# Patient Record
Sex: Female | Born: 1955
Health system: Southern US, Community
[De-identification: ages and names within clinical notes are randomized; demographics above are authoritative.]

## PROBLEM LIST (undated history)

## (undated) DIAGNOSIS — T4145XA Adverse effect of unspecified anesthetic, initial encounter: Secondary | ICD-10-CM

## (undated) DIAGNOSIS — Z87442 Personal history of urinary calculi: Secondary | ICD-10-CM

## (undated) DIAGNOSIS — M8580 Other specified disorders of bone density and structure, unspecified site: Secondary | ICD-10-CM

## (undated) DIAGNOSIS — M1990 Unspecified osteoarthritis, unspecified site: Secondary | ICD-10-CM

## (undated) DIAGNOSIS — Z9889 Other specified postprocedural states: Secondary | ICD-10-CM

## (undated) DIAGNOSIS — F32A Depression, unspecified: Secondary | ICD-10-CM

## (undated) DIAGNOSIS — T8859XA Other complications of anesthesia, initial encounter: Secondary | ICD-10-CM

## (undated) DIAGNOSIS — E781 Pure hyperglyceridemia: Secondary | ICD-10-CM

## (undated) DIAGNOSIS — I10 Essential (primary) hypertension: Secondary | ICD-10-CM

## (undated) DIAGNOSIS — F419 Anxiety disorder, unspecified: Secondary | ICD-10-CM

## (undated) DIAGNOSIS — H269 Unspecified cataract: Secondary | ICD-10-CM

## (undated) DIAGNOSIS — K219 Gastro-esophageal reflux disease without esophagitis: Secondary | ICD-10-CM

## (undated) DIAGNOSIS — R112 Nausea with vomiting, unspecified: Secondary | ICD-10-CM

## (undated) HISTORY — PX: VEIN LIGATION AND STRIPPING: SHX2653

## (undated) HISTORY — PX: FRACTURE SURGERY: SHX138

## (undated) HISTORY — PX: LITHOTRIPSY: SUR834

## (undated) HISTORY — PX: FOOT SURGERY: SHX648

## (undated) HISTORY — DX: Unspecified cataract: H26.9

## (undated) HISTORY — DX: Other specified disorders of bone density and structure, unspecified site: M85.80

## (undated) HISTORY — DX: Essential (primary) hypertension: I10

## (undated) HISTORY — DX: Depression, unspecified: F32.A

## (undated) HISTORY — DX: Anxiety disorder, unspecified: F41.9

## (undated) HISTORY — PX: TUBAL LIGATION: SHX77

## (undated) HISTORY — DX: Pure hyperglyceridemia: E78.1

## (undated) HISTORY — PX: CHOLECYSTECTOMY: SHX55

---
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
## 2001-08-22 ENCOUNTER — Other Ambulatory Visit: Admission: RE | Admit: 2001-08-22 | Discharge: 2001-08-22 | Payer: Self-pay | Admitting: Family Medicine

## 2004-07-04 ENCOUNTER — Other Ambulatory Visit: Payer: Self-pay

## 2005-01-17 ENCOUNTER — Ambulatory Visit: Payer: Self-pay | Admitting: Family Medicine

## 2005-07-06 DIAGNOSIS — N63 Unspecified lump in unspecified breast: Secondary | ICD-10-CM | POA: Insufficient documentation

## 2006-03-13 ENCOUNTER — Ambulatory Visit: Payer: Self-pay | Admitting: Family Medicine

## 2006-06-06 ENCOUNTER — Ambulatory Visit: Payer: Self-pay

## 2007-03-28 DIAGNOSIS — F5104 Psychophysiologic insomnia: Secondary | ICD-10-CM | POA: Insufficient documentation

## 2007-03-28 DIAGNOSIS — G4700 Insomnia, unspecified: Secondary | ICD-10-CM | POA: Insufficient documentation

## 2007-06-11 ENCOUNTER — Ambulatory Visit: Payer: Self-pay

## 2008-05-19 ENCOUNTER — Ambulatory Visit: Payer: Self-pay

## 2008-06-15 ENCOUNTER — Ambulatory Visit: Payer: Self-pay | Admitting: Obstetrics and Gynecology

## 2008-12-01 ENCOUNTER — Ambulatory Visit: Payer: Self-pay | Admitting: Obstetrics and Gynecology

## 2009-03-13 ENCOUNTER — Emergency Department: Payer: Self-pay | Admitting: Emergency Medicine

## 2009-04-15 ENCOUNTER — Ambulatory Visit: Payer: Self-pay | Admitting: Family Medicine

## 2009-06-29 ENCOUNTER — Ambulatory Visit: Payer: Self-pay | Admitting: Obstetrics and Gynecology

## 2009-06-30 ENCOUNTER — Ambulatory Visit: Payer: Self-pay | Admitting: Obstetrics and Gynecology

## 2009-07-06 ENCOUNTER — Other Ambulatory Visit: Payer: Self-pay | Admitting: Obstetrics and Gynecology

## 2009-09-20 ENCOUNTER — Emergency Department: Payer: Self-pay | Admitting: Emergency Medicine

## 2009-10-19 ENCOUNTER — Other Ambulatory Visit: Payer: Self-pay | Admitting: Cardiology

## 2009-11-23 ENCOUNTER — Ambulatory Visit: Payer: Self-pay | Admitting: Unknown Physician Specialty

## 2009-11-23 LAB — HM COLONOSCOPY

## 2010-03-13 ENCOUNTER — Ambulatory Visit: Payer: Self-pay | Admitting: Vascular Surgery

## 2010-03-17 ENCOUNTER — Ambulatory Visit: Payer: Self-pay | Admitting: Podiatry

## 2010-08-16 ENCOUNTER — Ambulatory Visit: Payer: Self-pay | Admitting: Obstetrics and Gynecology

## 2010-12-20 ENCOUNTER — Other Ambulatory Visit: Payer: Self-pay | Admitting: Obstetrics and Gynecology

## 2010-12-24 HISTORY — PX: ANKLE FRACTURE SURGERY: SHX122

## 2010-12-26 ENCOUNTER — Ambulatory Visit: Payer: Self-pay

## 2010-12-31 ENCOUNTER — Ambulatory Visit: Payer: Self-pay | Admitting: Family Medicine

## 2011-09-26 ENCOUNTER — Ambulatory Visit: Payer: Self-pay | Admitting: Obstetrics and Gynecology

## 2011-11-27 ENCOUNTER — Encounter: Payer: Self-pay | Admitting: Orthopedic Surgery

## 2011-12-12 ENCOUNTER — Other Ambulatory Visit: Payer: Self-pay | Admitting: Internal Medicine

## 2011-12-25 ENCOUNTER — Encounter: Payer: Self-pay | Admitting: Orthopedic Surgery

## 2011-12-31 ENCOUNTER — Other Ambulatory Visit: Payer: Self-pay | Admitting: Internal Medicine

## 2012-01-25 ENCOUNTER — Encounter: Payer: Self-pay | Admitting: Orthopedic Surgery

## 2012-02-22 ENCOUNTER — Encounter: Payer: Self-pay | Admitting: Orthopedic Surgery

## 2012-04-08 ENCOUNTER — Other Ambulatory Visit: Payer: Self-pay | Admitting: Internal Medicine

## 2012-04-08 LAB — LIPID PANEL
Cholesterol: 168 mg/dL (ref 0–200)
HDL Cholesterol: 61 mg/dL — ABNORMAL HIGH (ref 40–60)
Ldl Cholesterol, Calc: 89 mg/dL (ref 0–100)
Triglycerides: 90 mg/dL (ref 0–200)
VLDL Cholesterol, Calc: 18 mg/dL (ref 5–40)

## 2012-04-08 LAB — TSH: Thyroid Stimulating Horm: 2.46 u[IU]/mL

## 2012-04-08 LAB — HEMOGLOBIN A1C: Hemoglobin A1C: 5.6 % (ref 4.2–6.3)

## 2012-04-22 DIAGNOSIS — M19079 Primary osteoarthritis, unspecified ankle and foot: Secondary | ICD-10-CM | POA: Insufficient documentation

## 2012-10-04 ENCOUNTER — Emergency Department: Payer: Self-pay | Admitting: Emergency Medicine

## 2012-10-22 ENCOUNTER — Ambulatory Visit: Payer: Self-pay | Admitting: Obstetrics and Gynecology

## 2013-01-27 ENCOUNTER — Encounter: Payer: Self-pay | Admitting: Orthopedic Surgery

## 2013-02-17 DIAGNOSIS — M20019 Mallet finger of unspecified finger(s): Secondary | ICD-10-CM | POA: Insufficient documentation

## 2013-02-21 ENCOUNTER — Encounter: Payer: Self-pay | Admitting: Orthopedic Surgery

## 2013-03-24 ENCOUNTER — Encounter: Payer: Self-pay | Admitting: Orthopedic Surgery

## 2013-08-13 ENCOUNTER — Emergency Department: Payer: Self-pay | Admitting: Emergency Medicine

## 2013-08-13 LAB — URINALYSIS, COMPLETE
Bacteria: NONE SEEN
Bilirubin,UR: NEGATIVE
Glucose,UR: NEGATIVE mg/dL (ref 0–75)
Leukocyte Esterase: NEGATIVE
Nitrite: NEGATIVE
Ph: 6 (ref 4.5–8.0)
Protein: NEGATIVE
RBC,UR: NONE SEEN /HPF (ref 0–5)
Specific Gravity: 1.003 (ref 1.003–1.030)
Squamous Epithelial: 1
WBC UR: <1 /HPF (ref 0–5)

## 2013-08-13 LAB — COMPREHENSIVE METABOLIC PANEL
Albumin: 3.8 g/dL (ref 3.4–5.0)
Alkaline Phosphatase: 94 U/L (ref 50–136)
Anion Gap: 7 (ref 7–16)
BUN: 10 mg/dL (ref 7–18)
Bilirubin,Total: 0.3 mg/dL (ref 0.2–1.0)
Calcium, Total: 9.2 mg/dL (ref 8.5–10.1)
Chloride: 106 mmol/L (ref 98–107)
Co2: 26 mmol/L (ref 21–32)
Creatinine: 0.92 mg/dL (ref 0.60–1.30)
EGFR (African American): >60
EGFR (Non-African Amer.): >60
Glucose: 79 mg/dL (ref 65–99)
Osmolality: 276 (ref 275–301)
Potassium: 3.7 mmol/L (ref 3.5–5.1)
SGOT(AST): 18 U/L (ref 15–37)
SGPT (ALT): 18 U/L (ref 12–78)
Sodium: 139 mmol/L (ref 136–145)
Total Protein: 7.9 g/dL (ref 6.4–8.2)

## 2013-08-13 LAB — CBC
HCT: 39.4 % (ref 35.0–47.0)
HGB: 13.6 g/dL (ref 12.0–16.0)
MCH: 30.9 pg (ref 26.0–34.0)
MCHC: 34.5 g/dL (ref 32.0–36.0)
MCV: 90 fL (ref 80–100)
Platelet: 313 10*3/uL (ref 150–440)
RBC: 4.4 10*6/uL (ref 3.80–5.20)
RDW: 13.5 % (ref 11.5–14.5)
WBC: 8.8 10*3/uL (ref 3.6–11.0)

## 2013-10-27 ENCOUNTER — Ambulatory Visit: Payer: Self-pay | Admitting: Obstetrics and Gynecology

## 2013-11-10 ENCOUNTER — Other Ambulatory Visit: Payer: Self-pay | Admitting: Obstetrics and Gynecology

## 2013-11-10 LAB — LIPID PANEL
Cholesterol: 185 mg/dL (ref 0–200)
HDL Cholesterol: 58 mg/dL (ref 40–60)
Ldl Cholesterol, Calc: 103 mg/dL — ABNORMAL HIGH (ref 0–100)
Triglycerides: 121 mg/dL (ref 0–200)
VLDL Cholesterol, Calc: 24 mg/dL (ref 5–40)

## 2014-11-23 ENCOUNTER — Ambulatory Visit: Payer: Self-pay | Admitting: Obstetrics and Gynecology

## 2015-01-11 ENCOUNTER — Emergency Department: Payer: Self-pay | Admitting: Emergency Medicine

## 2015-01-11 LAB — CBC
HCT: 43.1 % (ref 35.0–47.0)
HGB: 13.8 g/dL (ref 12.0–16.0)
MCH: 29.3 pg (ref 26.0–34.0)
MCHC: 32 g/dL (ref 32.0–36.0)
MCV: 92 fL (ref 80–100)
Platelet: 281 10*3/uL (ref 150–440)
RBC: 4.71 10*6/uL (ref 3.80–5.20)
RDW: 13.5 % (ref 11.5–14.5)
WBC: 8.9 10*3/uL (ref 3.6–11.0)

## 2015-01-11 LAB — COMPREHENSIVE METABOLIC PANEL
Albumin: 3.5 g/dL (ref 3.4–5.0)
Alkaline Phosphatase: 91 U/L
Anion Gap: 5 — ABNORMAL LOW (ref 7–16)
BUN: 13 mg/dL (ref 7–18)
Bilirubin,Total: 0.2 mg/dL (ref 0.2–1.0)
Calcium, Total: 8.7 mg/dL (ref 8.5–10.1)
Chloride: 106 mmol/L (ref 98–107)
Co2: 28 mmol/L (ref 21–32)
Creatinine: 0.95 mg/dL (ref 0.60–1.30)
EGFR (African American): >60
EGFR (Non-African Amer.): >60
Glucose: 99 mg/dL (ref 65–99)
Osmolality: 278 (ref 275–301)
Potassium: 3.8 mmol/L (ref 3.5–5.1)
SGOT(AST): 20 U/L (ref 15–37)
SGPT (ALT): 25 U/L
Sodium: 139 mmol/L (ref 136–145)
Total Protein: 7.4 g/dL (ref 6.4–8.2)

## 2015-08-31 ENCOUNTER — Other Ambulatory Visit: Payer: Self-pay | Admitting: Obstetrics and Gynecology

## 2015-08-31 DIAGNOSIS — Z1231 Encounter for screening mammogram for malignant neoplasm of breast: Secondary | ICD-10-CM

## 2015-08-31 DIAGNOSIS — Z13820 Encounter for screening for osteoporosis: Secondary | ICD-10-CM

## 2015-11-28 ENCOUNTER — Ambulatory Visit: Payer: Self-pay

## 2015-11-28 ENCOUNTER — Other Ambulatory Visit: Payer: Self-pay

## 2015-12-05 ENCOUNTER — Ambulatory Visit
Admission: RE | Admit: 2015-12-05 | Discharge: 2015-12-05 | Disposition: A | Discharge status: Home / Self Care | Payer: Managed Care, Other (non HMO) | Source: Ambulatory Visit | Attending: Obstetrics and Gynecology | Admitting: Obstetrics and Gynecology

## 2015-12-05 DIAGNOSIS — Z13820 Encounter for screening for osteoporosis: Secondary | ICD-10-CM | POA: Diagnosis present

## 2015-12-05 DIAGNOSIS — Z1231 Encounter for screening mammogram for malignant neoplasm of breast: Secondary | ICD-10-CM | POA: Insufficient documentation

## 2015-12-05 DIAGNOSIS — M8580 Other specified disorders of bone density and structure, unspecified site: Secondary | ICD-10-CM | POA: Insufficient documentation

## 2016-06-25 DIAGNOSIS — M8580 Other specified disorders of bone density and structure, unspecified site: Secondary | ICD-10-CM | POA: Insufficient documentation

## 2016-06-25 DIAGNOSIS — H9319 Tinnitus, unspecified ear: Secondary | ICD-10-CM | POA: Insufficient documentation

## 2016-06-25 DIAGNOSIS — E781 Pure hyperglyceridemia: Secondary | ICD-10-CM

## 2016-06-25 DIAGNOSIS — F4322 Adjustment disorder with anxiety: Secondary | ICD-10-CM | POA: Insufficient documentation

## 2016-06-25 DIAGNOSIS — M25579 Pain in unspecified ankle and joints of unspecified foot: Secondary | ICD-10-CM | POA: Insufficient documentation

## 2016-06-25 DIAGNOSIS — F410 Panic disorder [episodic paroxysmal anxiety] without agoraphobia: Secondary | ICD-10-CM | POA: Insufficient documentation

## 2016-06-25 DIAGNOSIS — E559 Vitamin D deficiency, unspecified: Secondary | ICD-10-CM | POA: Insufficient documentation

## 2016-06-25 HISTORY — DX: Pure hyperglyceridemia: E78.1

## 2016-06-25 HISTORY — DX: Other specified disorders of bone density and structure, unspecified site: M85.80

## 2016-07-02 ENCOUNTER — Encounter: Payer: Self-pay | Admitting: Family Medicine

## 2016-07-02 ENCOUNTER — Ambulatory Visit (INDEPENDENT_AMBULATORY_CARE_PROVIDER_SITE_OTHER): Payer: Managed Care, Other (non HMO) | Admitting: Family Medicine

## 2016-07-02 VITALS — BP 116/72 | HR 76 | Temp 98.1°F | Resp 16 | Ht 64.5 in | Wt 198.0 lb

## 2016-07-02 DIAGNOSIS — Z131 Encounter for screening for diabetes mellitus: Secondary | ICD-10-CM

## 2016-07-02 DIAGNOSIS — E781 Pure hyperglyceridemia: Secondary | ICD-10-CM | POA: Diagnosis not present

## 2016-07-02 DIAGNOSIS — F4322 Adjustment disorder with anxiety: Secondary | ICD-10-CM | POA: Diagnosis not present

## 2016-07-02 MED ORDER — ESCITALOPRAM OXALATE 10 MG PO TABS: 10.0000 mg | ORAL_TABLET | Freq: Every day | ORAL | Status: DC

## 2016-07-02 NOTE — Patient Instructions (Signed)
We will call you with the lab results. 

## 2016-07-02 NOTE — Progress Notes (Signed)
Subjective:     Patient ID: KITRINA SCHUBEL, female   DOB: 1956/07/05, 60 y.o.   MRN: [id number]  HPI  Chief Complaint  Patient presents with  . Anxiety    Last seen for these problems on 12/03/2014 by Dr. Venia Minks. Was stable at that time and was told to continue Lexapro.  States she wishes to stay on Lexapro. Reports that she is still dealing with the loss of her sister, Mother, and a stillborn infant of a family member (May) over the last few years. States she wishes screening labs today. Gets the bulk of her health maintenance through her ob-gyn, Dr. Suszanne Finch.   Review of Systems     Objective:   Physical Exam  Constitutional: She appears well-developed and well-nourished. No distress.  Psychiatric: She has a normal mood and affect. Her behavior is normal.       Assessment:    1. Hypertriglyceridemia - Lipid panel  2. Adjustment disorder with anxiety - escitalopram (LEXAPRO) 10 MG tablet; Take 1 tablet (10 mg total) by mouth daily.  Dispense: 90 tablet; Refill: 3  3. Screening for diabetes mellitus - Comprehensive metabolic panel    Plan:    Further f/u pending lab results.

## 2016-07-03 ENCOUNTER — Telehealth: Payer: Self-pay

## 2016-07-03 LAB — COMPREHENSIVE METABOLIC PANEL
ALT: 20 IU/L (ref 0–32)
AST: 23 IU/L (ref 0–40)
Albumin/Globulin Ratio: 1.6 (ref 1.2–2.2)
Albumin: 4.1 g/dL (ref 3.6–4.8)
Alkaline Phosphatase: 78 IU/L (ref 39–117)
BUN/Creatinine Ratio: 17 (ref 12–28)
BUN: 14 mg/dL (ref 8–27)
Bilirubin Total: 0.3 mg/dL (ref 0.0–1.2)
CO2: 24 mmol/L (ref 18–29)
Calcium: 9.3 mg/dL (ref 8.7–10.3)
Chloride: 104 mmol/L (ref 96–106)
Creatinine, Ser: 0.83 mg/dL (ref 0.57–1.00)
GFR calc Af Amer: 89 mL/min/{1.73_m2} (ref >59)
GFR calc non Af Amer: 77 mL/min/{1.73_m2} (ref >59)
Globulin, Total: 2.6 g/dL (ref 1.5–4.5)
Glucose: 112 mg/dL — ABNORMAL HIGH (ref 65–99)
Potassium: 4.3 mmol/L (ref 3.5–5.2)
Sodium: 142 mmol/L (ref 134–144)
Total Protein: 6.7 g/dL (ref 6.0–8.5)

## 2016-07-03 LAB — LIPID PANEL
Chol/HDL Ratio: 2.6 ratio units (ref 0.0–4.4)
Cholesterol, Total: 154 mg/dL (ref 100–199)
HDL: 59 mg/dL (ref >39)
LDL Calculated: 82 mg/dL (ref 0–99)
Triglycerides: 67 mg/dL (ref 0–149)
VLDL Cholesterol Cal: 13 mg/dL (ref 5–40)

## 2016-07-03 NOTE — Telephone Encounter (Signed)
Patient advised as below. Patient reports that she will work on diet and exercise for now. sd

## 2016-07-03 NOTE — Telephone Encounter (Signed)
-----   Message from Carmon Ginsberg, [place] sent at 07/03/2016  7:36 AM EDT ----- Labs good except glucose is at "pre-diabetic" levels. Encourage regular exercise 30 minutes daily. Offer pre-diabetic class at [facility] [phone].

## 2016-09-13 LAB — HM PAP SMEAR

## 2016-09-17 ENCOUNTER — Other Ambulatory Visit: Payer: Self-pay | Admitting: Obstetrics and Gynecology

## 2016-09-17 DIAGNOSIS — Z1231 Encounter for screening mammogram for malignant neoplasm of breast: Secondary | ICD-10-CM

## 2016-09-17 DIAGNOSIS — Z13820 Encounter for screening for osteoporosis: Secondary | ICD-10-CM

## 2016-12-06 ENCOUNTER — Ambulatory Visit: Payer: Managed Care, Other (non HMO)

## 2016-12-06 ENCOUNTER — Other Ambulatory Visit: Payer: Managed Care, Other (non HMO)

## 2016-12-18 ENCOUNTER — Ambulatory Visit
Admission: RE | Admit: 2016-12-18 | Discharge: 2016-12-18 | Disposition: A | Discharge status: Home / Self Care | Payer: Managed Care, Other (non HMO) | Source: Ambulatory Visit | Attending: Obstetrics and Gynecology | Admitting: Obstetrics and Gynecology

## 2016-12-18 DIAGNOSIS — Z13820 Encounter for screening for osteoporosis: Secondary | ICD-10-CM

## 2016-12-18 DIAGNOSIS — Z1231 Encounter for screening mammogram for malignant neoplasm of breast: Secondary | ICD-10-CM

## 2017-01-29 ENCOUNTER — Ambulatory Visit (INDEPENDENT_AMBULATORY_CARE_PROVIDER_SITE_OTHER): Payer: Managed Care, Other (non HMO) | Admitting: Physician Assistant

## 2017-01-29 ENCOUNTER — Encounter: Payer: Self-pay | Admitting: Physician Assistant

## 2017-01-29 VITALS — BP 158/90 | HR 72 | Temp 98.8°F | Resp 16 | Wt 199.0 lb

## 2017-01-29 DIAGNOSIS — R7303 Prediabetes: Secondary | ICD-10-CM

## 2017-01-29 DIAGNOSIS — R030 Elevated blood-pressure reading, without diagnosis of hypertension: Secondary | ICD-10-CM

## 2017-01-29 DIAGNOSIS — R739 Hyperglycemia, unspecified: Secondary | ICD-10-CM

## 2017-01-29 DIAGNOSIS — R51 Headache: Secondary | ICD-10-CM

## 2017-01-29 DIAGNOSIS — R519 Headache, unspecified: Secondary | ICD-10-CM

## 2017-01-29 LAB — POCT GLYCOSYLATED HEMOGLOBIN (HGB A1C): Hemoglobin A1C: 5.9

## 2017-01-29 MED ORDER — KETOROLAC TROMETHAMINE 60 MG/2ML IM SOLN
60.0000 mg | Freq: Once | INTRAMUSCULAR | Status: AC
  Administered 2017-01-29: 60 mg via INTRAMUSCULAR

## 2017-01-29 NOTE — Patient Instructions (Signed)
Hypertension Hypertension, commonly called high blood pressure, is when the force of blood pumping through your arteries is too strong. Your arteries are the blood vessels that carry blood from your heart throughout your body. A blood pressure reading consists of a higher number over a lower number, such as 110/72. The higher number (systolic) is the pressure inside your arteries when your heart pumps. The lower number (diastolic) is the pressure inside your arteries when your heart relaxes. Ideally you want your blood pressure below 120/80. Hypertension forces your heart to work harder to pump blood. Your arteries may become narrow or stiff. Having untreated or uncontrolled hypertension can cause heart attack, stroke, kidney disease, and other problems. What increases the risk? Some risk factors for high blood pressure are controllable. Others are not. Risk factors you cannot control include:  Race. You may be at higher risk if you are African American.  Age. Risk increases with age.  Gender. Men are at higher risk than women before age 45 years. After age 65, women are at higher risk than men. Risk factors you can control include:  Not getting enough exercise or physical activity.  Being overweight.  Getting too much fat, sugar, calories, or salt in your diet.  Drinking too much alcohol. What are the signs or symptoms? Hypertension does not usually cause signs or symptoms. Extremely high blood pressure (hypertensive crisis) may cause headache, anxiety, shortness of breath, and nosebleed. How is this diagnosed? To check if you have hypertension, your health care provider will measure your blood pressure while you are seated, with your arm held at the level of your heart. It should be measured at least twice using the same arm. Certain conditions can cause a difference in blood pressure between your right and left arms. A blood pressure reading that is higher than normal on one occasion does  not mean that you need treatment. If it is not clear whether you have high blood pressure, you may be asked to return on a different day to have your blood pressure checked again. Or, you may be asked to monitor your blood pressure at home for 1 or more weeks. How is this treated? Treating high blood pressure includes making lifestyle changes and possibly taking medicine. Living a healthy lifestyle can help lower high blood pressure. You may need to change some of your habits. Lifestyle changes may include:  Following the DASH diet. This diet is high in fruits, vegetables, and whole grains. It is low in salt, red meat, and added sugars.  Keep your sodium intake below 2,300 mg per day.  Getting at least 30-45 minutes of aerobic exercise at least 4 times per week.  Losing weight if necessary.  Not smoking.  Limiting alcoholic beverages.  Learning ways to reduce stress. Your health care provider may prescribe medicine if lifestyle changes are not enough to get your blood pressure under control, and if one of the following is true:  You are 18-59 years of age and your systolic blood pressure is above 140.  You are 60 years of age or older, and your systolic blood pressure is above 150.  Your diastolic blood pressure is above 90.  You have diabetes, and your systolic blood pressure is over 140 or your diastolic blood pressure is over 90.  You have kidney disease and your blood pressure is above 140/90.  You have heart disease and your blood pressure is above 140/90. Your personal target blood pressure may vary depending on your medical   conditions, your age, and other factors. Follow these instructions at home:  Have your blood pressure rechecked as directed by your health care provider.  Take medicines only as directed by your health care provider. Follow the directions carefully. Blood pressure medicines must be taken as prescribed. The medicine does not work as well when you skip  doses. Skipping doses also puts you at risk for problems.  Do not smoke.  Monitor your blood pressure at home as directed by your health care provider. Contact a health care provider if:  You think you are having a reaction to medicines taken.  You have recurrent headaches or feel dizzy.  You have swelling in your ankles.  You have trouble with your vision. Get help right away if:  You develop a severe headache or confusion.  You have unusual weakness, numbness, or feel faint.  You have severe chest or abdominal pain.  You vomit repeatedly.  You have trouble breathing. This information is not intended to replace advice given to you by your health care provider. Make sure you discuss any questions you have with your health care provider. Document Released: 12/10/2005 Document Revised: 05/17/2016 Document Reviewed: 10/02/2013 Elsevier Interactive Patient Education  2017 [employer]vier Inc[employer]  

## 2017-01-29 NOTE — Progress Notes (Signed)
Patient: [name] [name] Female    DOB: [birth year]   61 y.o.   MRN: [id number] Visit Date: 01/29/2017  Today's Provider: Trinna Post, PA-C   Chief Complaint  Patient presents with  . Hypertension  . Hyperglycemia   Subjective:    Hypertension  This is a new problem. The problem is uncontrolled. Associated symptoms include anxiety, blurred vision, headaches and malaise/fatigue. Pertinent negatives include no chest pain, neck pain, palpitations, peripheral edema or shortness of breath.  Hyperglycemia  This is a new problem. Associated symptoms include fatigue and headaches. Pertinent negatives include no abdominal pain, change in bowel habit, chest pain, chills, congestion, diaphoresis, fever, neck pain, numbness, rash, sore throat or urinary symptoms.   Patient is a 61 y/o woman with a hx of hypertriglyceridemia here for assessment of HTN, headache, and prediabetes.  She was recently at an opthalmology appointment where they measured her eye pressures, and her right eye pressure was high, no cranial nerve II swelling. The optometrist told her to check whether she has high blood pressure, prediabetes, sleep apnea.  She has had a headache since Friday. She says she normally gets headaches every once in a while. It was not sudden. It is bitemporal. She is not nauseated or vomiting. She has sensitivity to light but not sound. No weakness or balance loss. She has been checking her BP when she has headaches and it is high. Her last documented BP here was normotensive, but at home it has been as high as 175/100. She has been taking ibuprofen for headaches without much relief. No Cp, Vision changes, SOB, problems urinating.  She also recently had a high fasting glucose and is here to have her A1C checked. She used to do water aerobics at the gym, but her husband has recently had surgeries and so she has gotten out of this habit. She is working on her diet again.  Allergies  Allergen  Reactions  . Demeclocycline   . Oxycodone-Acetaminophen Other (See Comments)     Current Outpatient Prescriptions:  .  calcium-vitamin D (OSCAL WITH D) 500-200 MG-UNIT tablet, Take 1 tablet by mouth., Disp: , Rfl:  .  Multiple Vitamin (MULTIVITAMIN) capsule, Take 1 capsule by mouth daily., Disp: , Rfl:  .  niacin 500 MG tablet, Take 500 mg by mouth 2 (two) times daily with a meal. , Disp: , Rfl:   Review of Systems  Constitutional: Positive for fatigue and malaise/fatigue. Negative for activity change, appetite change, chills, diaphoresis, fever and unexpected weight change.  HENT: Negative for congestion, sinus pain, sinus pressure and sore throat.   Eyes: Positive for blurred vision. Negative for photophobia, pain, discharge, redness, itching and visual disturbance.  Respiratory: Negative.  Negative for shortness of breath.   Cardiovascular: Negative.  Negative for chest pain and palpitations.  Gastrointestinal: Negative.  Negative for abdominal pain and change in bowel habit.  Endocrine: Negative for cold intolerance, heat intolerance, polydipsia, polyphagia and polyuria.  Musculoskeletal: Negative for neck pain.  Skin: Negative for rash.  Neurological: Positive for light-headedness and headaches. Negative for dizziness and numbness.    Social History  Substance Use Topics  . Smoking status: Never Smoker  . Smokeless tobacco: Never Used  . Alcohol use No     Comment: rare   Objective:   BP (!) 158/90 (BP Location: Right Arm, Patient Position: Sitting, Cuff Size: Large)   Pulse 72   Temp 98.8 F (37.1 C) (Oral)   Resp 16  Wt 199 lb (90.3 kg)   BMI 33.63 kg/m   Physical Exam  Constitutional: She is oriented to person, place, and time. She appears well-developed and well-nourished. No distress.  Appears to be in minor pain, sensitive to room lights.  HENT:  Head: Normocephalic.  Right Ear: External ear normal.  Left Ear: External ear normal.  Mouth/Throat: No  oropharyngeal exudate.  Eyes: Conjunctivae and EOM are normal. Pupils are equal, round, and reactive to light.  Neck: Neck supple.  Cardiovascular: Normal rate, regular rhythm, normal heart sounds and intact distal pulses.   Pulmonary/Chest: Effort normal and breath sounds normal.  Lymphadenopathy:    She has no cervical adenopathy.  Neurological: She is alert and oriented to person, place, and time. No cranial nerve deficit. Coordination normal.  Skin: Skin is warm and dry.  Psychiatric: She has a normal mood and affect. Her behavior is normal.        Assessment & Plan:     1. Elevated blood sugar  A1c today was 5.9. Counseled patient this is prediabetic. She should work on diet and exercise and we will recheck in three months.   - POCT glycosylated hemoglobin (Hb A1C)  2. Acute nonintractable headache, unspecified headache type  Administered in office, no hx GI bleeding, kidney function normal. Have counseled on return precautions.  - ketorolac (TORADOL) injection 60 mg; Inject 2 mLs (60 mg total) into the muscle once.  3. Prediabetes  See above.  4. Elevated BP without diagnosis of hypertension  Had patient fill out Epworth, but score was very low, not enough to indicate sleep study. This is patient's first documented elevated BP reading. Will have her followup in 2 weeks for recheck.  Return in about 2 weeks (around 02/12/2017) for BP recheck, then 3 mo for A1C.   Patient Instructions  Hypertension Hypertension, commonly called high blood pressure, is when the force of blood pumping through your arteries is too strong. Your arteries are the blood vessels that carry blood from your heart throughout your body. A blood pressure reading consists of a higher number over a lower number, such as 110/72. The higher number (systolic) is the pressure inside your arteries when your heart pumps. The lower number (diastolic) is the pressure inside your arteries when your heart relaxes.  Ideally you want your blood pressure below 120/80. Hypertension forces your heart to work harder to pump blood. Your arteries may become narrow or stiff. Having untreated or uncontrolled hypertension can cause heart attack, stroke, kidney disease, and other problems. What increases the risk? Some risk factors for high blood pressure are controllable. Others are not. Risk factors you cannot control include:  Race. You may be at higher risk if you are African American.  Age. Risk increases with age.  Gender. Men are at higher risk than women before age 23 years. After age 11, women are at higher risk than men. Risk factors you can control include:  Not getting enough exercise or physical activity.  Being overweight.  Getting too much fat, sugar, calories, or salt in your diet.  Drinking too much alcohol. What are the signs or symptoms? Hypertension does not usually cause signs or symptoms. Extremely high blood pressure (hypertensive crisis) may cause headache, anxiety, shortness of breath, and nosebleed. How is this diagnosed? To check if you have hypertension, your health care provider will measure your blood pressure while you are seated, with your arm held at the level of your heart. It should be measured at least  twice using the same arm. Certain conditions can cause a difference in blood pressure between your right and left arms. A blood pressure reading that is higher than normal on one occasion does not mean that you need treatment. If it is not clear whether you have high blood pressure, you may be asked to return on a different day to have your blood pressure checked again. Or, you may be asked to monitor your blood pressure at home for 1 or more weeks. How is this treated? Treating high blood pressure includes making lifestyle changes and possibly taking medicine. Living a healthy lifestyle can help lower high blood pressure. You may need to change some of your habits. Lifestyle  changes may include:  Following the DASH diet. This diet is high in fruits, vegetables, and whole grains. It is low in salt, red meat, and added sugars.  Keep your sodium intake below 2,300 mg per day.  Getting at least 30-45 minutes of aerobic exercise at least 4 times per week.  Losing weight if necessary.  Not smoking.  Limiting alcoholic beverages.  Learning ways to reduce stress. Your health care provider may prescribe medicine if lifestyle changes are not enough to get your blood pressure under control, and if one of the following is true:  You are 4-42 years of age and your systolic blood pressure is above 140.  You are 12 years of age or older, and your systolic blood pressure is above 150.  Your diastolic blood pressure is above 90.  You have diabetes, and your systolic blood pressure is over [id number] or your diastolic blood pressure is over 90.  You have kidney disease and your blood pressure is above 140/90.  You have heart disease and your blood pressure is above 140/90. Your personal target blood pressure may vary depending on your medical conditions, your age, and other factors. Follow these instructions at home:  Have your blood pressure rechecked as directed by your health care provider.  Take medicines only as directed by your health care provider. Follow the directions carefully. Blood pressure medicines must be taken as prescribed. The medicine does not work as well when you skip doses. Skipping doses also puts you at risk for problems.  Do not smoke.  Monitor your blood pressure at home as directed by your health care provider. Contact a health care provider if:  You think you are having a reaction to medicines taken.  You have recurrent headaches or feel dizzy.  You have swelling in your ankles.  You have trouble with your vision. Get help right away if:  You develop a severe headache or confusion.  You have unusual weakness, numbness, or feel  faint.  You have severe chest or abdominal pain.  You vomit repeatedly.  You have trouble breathing. This information is not intended to replace advice given to you by your health care provider. Make sure you discuss any questions you have with your health care provider. Document Released: 12/10/2005 Document Revised: 05/17/2016 Document Reviewed: 10/02/2013 Elsevier Interactive Patient Education  2017 [employer].   The entirety of the information documented in the History of Present Illness, Review of Systems and Physical Exam were personally obtained by me. Portions of this information were initially documented by Ashley Royalty, CMA and reviewed by me for thoroughness and accuracy.        Trinna Post, PA-C  [facility]

## 2017-01-30 ENCOUNTER — Emergency Department: Payer: Managed Care, Other (non HMO)

## 2017-01-30 ENCOUNTER — Encounter: Payer: Self-pay | Admitting: Emergency Medicine

## 2017-01-30 ENCOUNTER — Emergency Department
Admission: EM | Admit: 2017-01-30 | Discharge: 2017-01-30 | Disposition: A | Discharge status: Home / Self Care | Payer: Managed Care, Other (non HMO) | Attending: Emergency Medicine | Admitting: Emergency Medicine

## 2017-01-30 ENCOUNTER — Ambulatory Visit: Payer: Managed Care, Other (non HMO) | Admitting: Physician Assistant

## 2017-01-30 DIAGNOSIS — R51 Headache: Secondary | ICD-10-CM | POA: Diagnosis not present

## 2017-01-30 DIAGNOSIS — R519 Headache, unspecified: Secondary | ICD-10-CM

## 2017-01-30 DIAGNOSIS — I10 Essential (primary) hypertension: Secondary | ICD-10-CM | POA: Diagnosis not present

## 2017-01-30 DIAGNOSIS — R002 Palpitations: Secondary | ICD-10-CM | POA: Diagnosis present

## 2017-01-30 DIAGNOSIS — Z79899 Other long term (current) drug therapy: Secondary | ICD-10-CM | POA: Insufficient documentation

## 2017-01-30 LAB — BASIC METABOLIC PANEL
Anion gap: 7 (ref 5–15)
BUN: 17 mg/dL (ref 6–20)
CO2: 22 mmol/L (ref 22–32)
Calcium: 9.8 mg/dL (ref 8.9–10.3)
Chloride: 110 mmol/L (ref 101–111)
Creatinine, Ser: 0.87 mg/dL (ref 0.44–1.00)
GFR calc Af Amer: >60 mL/min (ref >60)
GFR calc non Af Amer: >60 mL/min (ref >60)
Glucose, Bld: 101 mg/dL — ABNORMAL HIGH (ref 65–99)
Potassium: 3.6 mmol/L (ref 3.5–5.1)
Sodium: 139 mmol/L (ref 135–145)

## 2017-01-30 LAB — CBC
HCT: 41.5 % (ref 35.0–47.0)
Hemoglobin: 14.2 g/dL (ref 12.0–16.0)
MCH: 30.4 pg (ref 26.0–34.0)
MCHC: 34.3 g/dL (ref 32.0–36.0)
MCV: 88.8 fL (ref 80.0–100.0)
Platelets: 304 10*3/uL (ref 150–440)
RBC: 4.68 MIL/uL (ref 3.80–5.20)
RDW: 13.4 % (ref 11.5–14.5)
WBC: 6 10*3/uL (ref 3.6–11.0)

## 2017-01-30 LAB — TROPONIN I
Troponin I: <0.03 ng/mL (ref <0.03)
Troponin I: <0.03 ng/mL (ref <0.03)

## 2017-01-30 MED ORDER — BUTALBITAL-APAP-CAFFEINE 50-325-40 MG PO TABS: 1.0000 | ORAL_TABLET | Freq: Four times a day (QID) | ORAL | 0 refills | Status: DC | PRN

## 2017-01-30 NOTE — ED Triage Notes (Signed)
Pt presents to ED with elevated blood pressure since thursday. 170/100 at home this morning and pt states she felt like her heart was beating too hard with a rate at home of 120. Pt reports that she noticed that she develops a headache when her blood pressure is elevated. Seen by pcp yesterday and was told that they felt her blood pressure being elevated was related to the severity of her headache. Pt  Alert and calm with no distress noted.

## 2017-01-30 NOTE — ED Provider Notes (Signed)
[facility] Emergency Department Provider Note   ____________________________________________   First MD Initiated Contact with Patient 01/30/17 [phone]     (approximate)  I have reviewed the triage vital signs and the nursing notes.   HISTORY  Chief Complaint Hypertension and Palpitations    HPI [name] [name] is a 61 y.o. female who comes into the hospital today with headache and elevated blood pressure. The patient reports that she went to the eye doctor yesterday and they did pressures and found that her intraocular pressures in her right eye were elevated. The eye doctor told her that given the fact that she had been having a headache this could be medical and not related to her eye. She reports that she also went to her family doctor's office and saw the PA. They were unsure if her headache was due to the blood pressure the blood pressure due to her headache. She  Received a shot of Toradol and was discharged to home. She reports that her blood pressures have continued to be up and down. She reports over the last few days it has been in the 140s over 90s with it being highest at 170/100. The patient reports that before going to bed last night she didn't Norco which helped with the headache but when she would get up her  Headache would be back. She reports that laying in the bed her blood pressure seems to go up but when she sits up her blood pressure seems to normalize. The patient did not take any prescription medications for blood pressure. She rates her headache 4 out of 10 in intensity. She reports it is nothing like it's been in the past. The patient states that she woke her husband up and told them what was going on and he was concerning which is why she came into the hospital.   Past Medical History:  Diagnosis Date  . Anxiety     Patient Active Problem List   Diagnosis Date Noted  . Adjustment disorder with anxiety 06/25/2016  .  Hypertriglyceridemia 06/25/2016  . Osteopenia 06/25/2016  . Buzzing in ear 06/25/2016  . Avitaminosis D 06/25/2016  . Cannot sleep 03/28/2007    Past Surgical History:  Procedure Laterality Date  . ANKLE FRACTURE SURGERY    . CHOLECYSTECTOMY      Prior to Admission medications   Medication Sig Start Date End Date Taking? Authorizing Provider  calcium-vitamin D (OSCAL WITH D) 500-200 MG-UNIT tablet Take 1 tablet by mouth.   Yes Historical Provider, MD  Multiple Vitamin (MULTIVITAMIN) capsule Take 1 capsule by mouth daily.   Yes Historical Provider, MD  niacin 500 MG tablet Take 500 mg by mouth 2 (two) times daily with a meal.    Yes Historical Provider, MD  butalbital-acetaminophen-caffeine (FIORICET, ESGIC) 50-325-40 MG tablet Take 1-2 tablets by mouth every 6 (six) hours as needed for headache. 01/30/17 01/30/18  Loney Hering, MD    Allergies Oxycodone-acetaminophen  Family History  Problem Relation Age of Onset  . Breast cancer Maternal Aunt 60    Social History Social History  Substance Use Topics  . Smoking status: Never Smoker  . Smokeless tobacco: Never Used  . Alcohol use No     Comment: rare    Review of Systems Constitutional: No fever/chills Eyes: No visual changes. ENT: No sore throat. Cardiovascular:  chest pain. Respiratory: Denies shortness of breath. Gastrointestinal: No abdominal pain.  No nausea, no vomiting.  No diarrhea.  No constipation.  Genitourinary: Negative for dysuria. Musculoskeletal: Negative for back pain. Skin: Negative for rash. Neurological: headache  10-point ROS otherwise negative.  ____________________________________________   PHYSICAL EXAM:  VITAL SIGNS: ED Triage Vitals  Enc Vitals Group     BP 01/30/17 0329 (!) 155/90     Pulse Rate 01/30/17 0329 81     Resp 01/30/17 0329 18     Temp 01/30/17 0329 98.2 F (36.8 C)     Temp Source 01/30/17 0329 Oral     SpO2 01/30/17 0329 100 %     Weight 01/30/17 0330 199 lb  (90.3 kg)     Height 01/30/17 0330 5\' 5"  (1.651 m)     Head Circumference --      Peak Flow --      Pain Score 01/30/17 0330 5     Pain Loc --      Pain Edu? --      Excl. in [place]? --     Constitutional: Alert and oriented. Well appearing and in no acute distress. Eyes: Conjunctivae are normal. PERRL. EOMI. Head: Atraumatic. Nose: No congestion/rhinnorhea. Mouth/Throat: Mucous membranes are moist.  Oropharynx non-erythematous. Cardiovascular: Normal rate, regular rhythm. Grossly normal heart sounds.  Good peripheral circulation. Respiratory: Normal respiratory effort.  No retractions. Lungs CTAB. Gastrointestinal: Soft and nontender. No distention. No abdominal bruits. No CVA tenderness. Musculoskeletal: No lower extremity tenderness nor edema.   Neurologic:  Normal speech and language. Cranial nerves II through XII grossly intact with no focal motor or neuro deficits Skin:  Skin is warm, dry and intact.  Psychiatric: Mood and affect are normal. Speech and behavior are normal.  ____________________________________________   LABS (all labs ordered are listed, but only abnormal results are displayed)  Labs Reviewed  BASIC METABOLIC PANEL - Abnormal; Notable for the following:       Result Value   Glucose, Bld 101 (*)    All other components within normal limits  CBC  TROPONIN I  TROPONIN I   ____________________________________________  EKG  ED ECG REPORT I, Loney Hering, the attending physician, personally viewed and interpreted this ECG.   Date: 01/30/2017  EKG Time: 336  Rate: 84  Rhythm: normal sinus rhythm  Axis: normal  Intervals:none  ST&T Change: normal  ____________________________________________  RADIOLOGY  CXR CT head ____________________________________________   PROCEDURES  Procedure(s) performed: None  Procedures  Critical Care performed: No  ____________________________________________   INITIAL IMPRESSION / ASSESSMENT AND  PLAN / ED COURSE  Pertinent labs & imaging results that were available during my care of the patient were reviewed by me and considered in my medical decision making (see chart for details).  This is a 61 year old female who comes into the hospital today with some headache and similar to blood pressures. The patient's blood pressures up and elevated the last few weeks. Her blood pressure currently is in the 140s. The patient's headache is also only a 4 out of 10. Her blood work is unremarkable. I will send the patient for CT scan of her head and I'll give her dose of Fioricet for headache. I will reassess the patient's troponin and then reassess the patient.  Clinical Course as of Jan 30 841  Wed Jan 30, 2017  [id number] No active cardiopulmonary disease. DG Chest 2 View [AW]  [phone] No evidence of acute intracranial abnormality. CT Head Wo Contrast [AW]    Clinical Course User Index [AW] Loney Hering, MD   Patient's blood work is unremarkable and the patient's  CT is unremarkable. She'll be discharged home to follow back up with her primary care physician for further evaluation of her elevated blood pressure.  ____________________________________________   FINAL CLINICAL IMPRESSION(S) / ED DIAGNOSES  Final diagnoses:  Hypertension, unspecified type  Nonintractable headache, unspecified chronicity pattern, unspecified headache type      NEW MEDICATIONS STARTED DURING THIS VISIT:  New Prescriptions   BUTALBITAL-ACETAMINOPHEN-CAFFEINE (FIORICET, ESGIC) 50-325-40 MG TABLET    Take 1-2 tablets by mouth every 6 (six) hours as needed for headache.     Note:  This document was prepared using Dragon voice recognition software and may include unintentional dictation errors.    Loney Hering, MD 01/30/17 [phone]

## 2017-01-30 NOTE — ED Notes (Signed)
2 failed attempts for redraw of troponin.

## 2017-02-04 ENCOUNTER — Ambulatory Visit (INDEPENDENT_AMBULATORY_CARE_PROVIDER_SITE_OTHER): Payer: Managed Care, Other (non HMO) | Admitting: Physician Assistant

## 2017-02-04 ENCOUNTER — Encounter: Payer: Self-pay | Admitting: Physician Assistant

## 2017-02-04 VITALS — BP 148/82 | Temp 99.0°F | Resp 16 | Wt 195.0 lb

## 2017-02-04 DIAGNOSIS — Z0189 Encounter for other specified special examinations: Secondary | ICD-10-CM | POA: Diagnosis not present

## 2017-02-04 DIAGNOSIS — I10 Essential (primary) hypertension: Secondary | ICD-10-CM | POA: Diagnosis not present

## 2017-02-04 DIAGNOSIS — F4322 Adjustment disorder with anxiety: Secondary | ICD-10-CM | POA: Diagnosis not present

## 2017-02-04 MED ORDER — LISINOPRIL 10 MG PO TABS: 10.0000 mg | ORAL_TABLET | Freq: Every day | ORAL | 2 refills | Status: DC

## 2017-02-04 MED ORDER — ESCITALOPRAM OXALATE 10 MG PO TABS: ORAL_TABLET | ORAL | 2 refills | Status: DC

## 2017-02-04 NOTE — Progress Notes (Signed)
Patient: [name] [name] Female    DOB: 11/02/1956   61 y.o.   MRN: [id number] Visit Date: 02/04/2017  Today's Provider: Trinna Post, PA-C   Chief Complaint  Patient presents with  . Hypertension   Subjective:    HPI Patient comes in today to follow up of ER visit on 01/30/2017 and assessment of BP. Patient was seen in clinic on 01/29/2017 with elevated BP and headache. Given shot of Toradol and told to report to ER if it turned into worst headache of her life or proceeded to neurological changes. Patient was seen in [facility] ER on 01/30/2017 with normal CT head. She was given fioricet for headache and told to follow up PCP.  Patient reports that her BP has been averaging in the 170-150s/90s. Patient reports that she also has had headaches, but they have improved since last visit. She is not having visual changes, chest pain, urinary issues. She is also reporting that she is having some daytime sleepiness and does have snoring and would like to pursue sleep study.     Allergies  Allergen Reactions  . Oxycodone-Acetaminophen Other (See Comments)     Current Outpatient Prescriptions:  .  butalbital-acetaminophen-caffeine (FIORICET, ESGIC) 50-325-40 MG tablet, Take 1-2 tablets by mouth every 6 (six) hours as needed for headache., Disp: 20 tablet, Rfl: 0 .  calcium-vitamin D (OSCAL WITH D) 500-200 MG-UNIT tablet, Take 1 tablet by mouth., Disp: , Rfl:  .  Multiple Vitamin (MULTIVITAMIN) capsule, Take 1 capsule by mouth daily., Disp: , Rfl:  .  niacin 500 MG tablet, Take 500 mg by mouth 2 (two) times daily with a meal. , Disp: , Rfl:   Review of Systems  Constitutional: Negative.   Respiratory: Negative for cough, choking, chest tightness, shortness of breath and stridor.   Cardiovascular: Negative for chest pain, palpitations and leg swelling.  Neurological: Positive for headaches. Negative for dizziness.    Social History  Substance Use Topics  . Smoking status: Never  Smoker  . Smokeless tobacco: Never Used  . Alcohol use No     Comment: rare   Objective:   BP (!) 148/82 (BP Location: Right Arm, Patient Position: Sitting, Cuff Size: Normal)   Temp 99 F (37.2 C)   Resp 16   Wt 195 lb (88.5 kg)   BMI 32.45 kg/m   Physical Exam  Constitutional: She is oriented to person, place, and time. She appears well-developed and well-nourished.  Cardiovascular: Normal rate, regular rhythm, normal heart sounds and intact distal pulses.  Exam reveals no gallop and no friction rub.   No murmur heard. Pulmonary/Chest: Effort normal and breath sounds normal.  Neurological: She is alert and oriented to person, place, and time.  Skin: Skin is warm and dry.  Psychiatric: She has a normal mood and affect. Her behavior is normal.  Vitals reviewed.       Assessment & Plan:     1. Essential hypertension  Will start lisinopril as below. Pt to call back with problems/questions. Will do BP check in 2 weeks. Lipid panel to assess triglycerides, hx of this.  - Lipid panel - lisinopril (PRINIVIL,ZESTRIL) 10 MG tablet; Take 1 tablet (10 mg total) by mouth daily.  Dispense: 30 tablet; Refill: 2  2. Adjustment disorder with anxiety  Patient went back on Lexapro herself because she felt her HTN might be due to anxiety.  - escitalopram (LEXAPRO) 10 MG tablet; Take 1/2 tablet for first week, then increase  to 1 full tablet  Dispense: 20 tablet; Refill: 2  3. Need for assessment for sleep apnea  See below. Pt completed Epworth sleep questionnaire in office. Will scan into media.   - Nocturnal polysomnography (NPSG); Future  Return if symptoms worsen or fail to improve.   Patient Instructions  Hypertension Hypertension is another name for high blood pressure. High blood pressure forces your heart to work harder to pump blood. A blood pressure reading has two numbers, which includes a higher number over a lower number (example: 110/72). Follow these instructions at  home:  Have your blood pressure rechecked by your doctor.  Only take medicine as told by your doctor. Follow the directions carefully. The medicine does not work as well if you skip doses. Skipping doses also puts you at risk for problems.  Do not smoke.  Monitor your blood pressure at home as told by your doctor. Contact a doctor if:  You think you are having a reaction to the medicine you are taking.  You have repeat headaches or feel dizzy.  You have puffiness (swelling) in your ankles.  You have trouble with your vision. Get help right away if:  You get a very bad headache and are confused.  You feel weak, numb, or faint.  You get chest or belly (abdominal) pain.  You throw up (vomit).  You cannot breathe very well. This information is not intended to replace advice given to you by your health care provider. Make sure you discuss any questions you have with your health care provider. Document Released: 05/28/2008 Document Revised: 05/17/2016 Document Reviewed: 10/02/2013 Elsevier Interactive Patient Education  2017 [employer].   The entirety of the information documented in the History of Present Illness, Review of Systems and Physical Exam were personally obtained by me. Portions of this information were initially documented by [facility] and reviewed by me for thoroughness and accuracy.           Trinna Post, PA-C  [facility]

## 2017-02-04 NOTE — Patient Instructions (Signed)
Hypertension Hypertension is another name for high blood pressure. High blood pressure forces your heart to work harder to pump blood. A blood pressure reading has two numbers, which includes a higher number over a lower number (example: 110/72). Follow these instructions at home:  Have your blood pressure rechecked by your doctor.  Only take medicine as told by your doctor. Follow the directions carefully. The medicine does not work as well if you skip doses. Skipping doses also puts you at risk for problems.  Do not smoke.  Monitor your blood pressure at home as told by your doctor. Contact a doctor if:  You think you are having a reaction to the medicine you are taking.  You have repeat headaches or feel dizzy.  You have puffiness (swelling) in your ankles.  You have trouble with your vision. Get help right away if:  You get a very bad headache and are confused.  You feel weak, numb, or faint.  You get chest or belly (abdominal) pain.  You throw up (vomit).  You cannot breathe very well. This information is not intended to replace advice given to you by your health care provider. Make sure you discuss any questions you have with your health care provider. Document Released: 05/28/2008 Document Revised: 05/17/2016 Document Reviewed: 10/02/2013 Elsevier Interactive Patient Education  2017 Elsevier Inc.  

## 2017-02-08 ENCOUNTER — Telehealth: Payer: Self-pay

## 2017-02-08 LAB — LIPID PANEL
Chol/HDL Ratio: 3.3 ratio units (ref 0.0–4.4)
Cholesterol, Total: 183 mg/dL (ref 100–199)
HDL: 55 mg/dL (ref >39)
LDL Calculated: 111 mg/dL — ABNORMAL HIGH (ref 0–99)
Triglycerides: 87 mg/dL (ref 0–149)
VLDL Cholesterol Cal: 17 mg/dL (ref 5–40)

## 2017-02-08 NOTE — Telephone Encounter (Signed)
-----   Message from Trinna Post, [place] sent at 02/08/2017  8:32 AM EST ----- Lipid panel normal.

## 2017-02-08 NOTE — Telephone Encounter (Signed)
Patient has been advised. KW 

## 2017-02-12 ENCOUNTER — Ambulatory Visit: Payer: Managed Care, Other (non HMO) | Admitting: Physician Assistant

## 2017-02-19 ENCOUNTER — Encounter: Payer: Self-pay | Admitting: Physician Assistant

## 2017-02-19 ENCOUNTER — Ambulatory Visit (INDEPENDENT_AMBULATORY_CARE_PROVIDER_SITE_OTHER): Payer: Managed Care, Other (non HMO) | Admitting: Physician Assistant

## 2017-02-19 VITALS — BP 116/74 | HR 72 | Temp 98.4°F | Resp 16 | Wt 195.0 lb

## 2017-02-19 DIAGNOSIS — I10 Essential (primary) hypertension: Secondary | ICD-10-CM

## 2017-02-19 DIAGNOSIS — F4322 Adjustment disorder with anxiety: Secondary | ICD-10-CM | POA: Diagnosis not present

## 2017-02-19 HISTORY — DX: Essential (primary) hypertension: I10

## 2017-02-19 NOTE — Progress Notes (Signed)
Patient: [name] [name] Female    DOB: 09-19-1956   61 y.o.   MRN: [id number] Visit Date: 02/19/2017  Today's Provider: Trinna Post, PA-C   Chief Complaint  Patient presents with  . Hypertension    Two week follow-up   . Anxiety   Subjective:    Hypertension  The problem has been gradually improving (Blood pressures are around 140's/80's) since onset. Associated symptoms include anxiety. Pertinent negatives include no blurred vision, chest pain, headaches, malaise/fatigue, neck pain, orthopnea, palpitations, peripheral edema, PND, shortness of breath or sweats. Past treatments include ACE inhibitors. There are no compliance problems.   Anxiety  Presents for follow-up visit. Symptoms include insomnia. Patient reports no chest pain, decreased concentration, depressed mood, dizziness, excessive worry, nervous/anxious behavior, palpitations, panic, restlessness, shortness of breath or suicidal ideas. The quality of sleep is fair. Nighttime awakenings: several.      Allergies  Allergen Reactions  . Oxycodone-Acetaminophen Other (See Comments)     Current Outpatient Prescriptions:  .  butalbital-acetaminophen-caffeine (FIORICET, ESGIC) 50-325-40 MG tablet, Take 1-2 tablets by mouth every 6 (six) hours as needed for headache., Disp: 20 tablet, Rfl: 0 .  calcium-vitamin D (OSCAL WITH D) 500-200 MG-UNIT tablet, Take 1 tablet by mouth., Disp: , Rfl:  .  escitalopram (LEXAPRO) 10 MG tablet, Take 1/2 tablet for first week, then increase to 1 full tablet, Disp: 20 tablet, Rfl: 2 .  lisinopril (PRINIVIL,ZESTRIL) 10 MG tablet, Take 1 tablet (10 mg total) by mouth daily., Disp: 30 tablet, Rfl: 2 .  Multiple Vitamin (MULTIVITAMIN) capsule, Take 1 capsule by mouth daily., Disp: , Rfl:  .  niacin 500 MG tablet, Take 500 mg by mouth 2 (two) times daily with a meal. , Disp: , Rfl:   Review of Systems  Constitutional: Negative.  Negative for malaise/fatigue.  Eyes: Negative for  blurred vision.  Respiratory: Negative.  Negative for shortness of breath.   Cardiovascular: Negative.  Negative for chest pain, palpitations, orthopnea and PND.  Gastrointestinal: Negative.   Musculoskeletal: Negative for neck pain.  Neurological: Negative for dizziness, light-headedness and headaches.  Psychiatric/Behavioral: Positive for sleep disturbance. Negative for agitation, behavioral problems, decreased concentration, dysphoric mood, hallucinations, self-injury and suicidal ideas. The patient has insomnia. The patient is not nervous/anxious and is not hyperactive.     Social History  Substance Use Topics  . Smoking status: Never Smoker  . Smokeless tobacco: Never Used  . Alcohol use No     Comment: rare   Objective:   BP 116/74 (BP Location: Left Arm, Patient Position: Sitting, Cuff Size: Large)   Pulse 72   Temp 98.4 F (36.9 C) (Oral)   Resp 16   Wt 195 lb (88.5 kg)   BMI 32.45 kg/m   Depression screen [facility] 2/9 02/19/2017  Decreased Interest 0  Down, Depressed, Hopeless 0  PHQ - 2 Score 0  Altered sleeping 3  Tired, decreased energy 0  Change in appetite 0  Feeling bad or failure about yourself  0  Trouble concentrating 0  Moving slowly or fidgety/restless 0  Suicidal thoughts 0  PHQ-9 Score 3      Physical Exam  Constitutional: She is oriented to person, place, and time. She appears well-developed and well-nourished. No distress.  Cardiovascular: Normal rate, regular rhythm and intact distal pulses.  Exam reveals no gallop and no friction rub.   No murmur heard. Pulmonary/Chest: Effort normal and breath sounds normal. No respiratory distress. She has no wheezes.  She has no rales.  Neurological: She is alert and oriented to person, place, and time.  Skin: Skin is warm and dry. She is not diaphoretic. No pallor.  Psychiatric: She has a normal mood and affect. Her behavior is normal.        Assessment & Plan:  1. Essential hypertension  Patient  normotensive today with no c/o drug side effects. Will stay on 10 mg Lisinopril daily. Signed form for sleep study yesterday so patient should be contacted about this soon. Follow up in 6 mo for recheck.   2. Adjustment disorder with anxiety  Patient doing well on 10 mg Lexparo, no complaints. Still wakes up at night but does not desire tx for this.  Return in about 6 months (around 08/19/2017) for hypertension.  The entirety of the information documented in the History of Present Illness, Review of Systems and Physical Exam were personally obtained by me. Portions of this information were initially documented by Ashley Royalty, CMA and reviewed by me for thoroughness and accuracy.           Trinna Post, PA-C  [facility]

## 2017-02-19 NOTE — Patient Instructions (Signed)
Hypertension °Hypertension is another name for high blood pressure. High blood pressure forces your heart to work harder to pump blood. This can cause problems over time. °There are two numbers in a blood pressure reading. There is a top number (systolic) over a bottom number (diastolic). It is best to have a blood pressure below 120/80. Healthy choices can help lower your blood pressure. You may need medicine to help lower your blood pressure if: °· Your blood pressure cannot be lowered with healthy choices. °· Your blood pressure is higher than 130/80. °Follow these instructions at home: °Eating and drinking  °· If directed, follow the DASH eating plan. This diet includes: °¨ Filling half of your plate at each meal with fruits and vegetables. °¨ Filling one quarter of your plate at each meal with whole grains. Whole grains include whole wheat pasta, brown rice, and whole grain bread. °¨ Eating or drinking low-fat dairy products, such as skim milk or low-fat yogurt. °¨ Filling one quarter of your plate at each meal with low-fat (lean) proteins. Low-fat proteins include fish, skinless chicken, eggs, beans, and tofu. °¨ Avoiding fatty meat, cured and processed meat, or chicken with skin. °¨ Avoiding premade or processed food. °· Eat less than 1,500 mg of salt (sodium) a day. °· Limit alcohol use to no more than 1 drink a day for nonpregnant women and 2 drinks a day for men. One drink equals 12 oz of beer, 5 oz of wine, or 1½ oz of hard liquor. °Lifestyle  °· Work with your doctor to stay at a healthy weight or to lose weight. Ask your doctor what the best weight is for you. °· Get at least 30 minutes of exercise that causes your heart to beat faster (aerobic exercise) most days of the week. This may include walking, swimming, or biking. °· Get at least 30 minutes of exercise that strengthens your muscles (resistance exercise) at least 3 days a week. This may include lifting weights or pilates. °· Do not use any  products that contain nicotine or tobacco. This includes cigarettes and e-cigarettes. If you need help quitting, ask your doctor. °· Check your blood pressure at home as told by your doctor. °· Keep all follow-up visits as told by your doctor. This is important. °Medicines  °· Take over-the-counter and prescription medicines only as told by your doctor. Follow directions carefully. °· Do not skip doses of blood pressure medicine. The medicine does not work as well if you skip doses. Skipping doses also puts you at risk for problems. °· Ask your doctor about side effects or reactions to medicines that you should watch for. °Contact a doctor if: °· You think you are having a reaction to the medicine you are taking. °· You have headaches that keep coming back (recurring). °· You feel dizzy. °· You have swelling in your ankles. °· You have trouble with your vision. °Get help right away if: °· You get a very bad headache. °· You start to feel confused. °· You feel weak or numb. °· You feel faint. °· You get very bad pain in your: °¨ Chest. °¨ Belly (abdomen). °· You throw up (vomit) more than once. °· You have trouble breathing. °Summary °· Hypertension is another name for high blood pressure. °· Making healthy choices can help lower blood pressure. If your blood pressure cannot be controlled with healthy choices, you may need to take medicine. °This information is not intended to replace advice given to you by your   health care provider. Make sure you discuss any questions you have with your health care provider. °Document Released: 05/28/2008 Document Revised: 11/07/2016 Document Reviewed: 11/07/2016 °Elsevier Interactive Patient Education © 2017 Elsevier Inc. ° °

## 2017-02-27 ENCOUNTER — Encounter: Payer: Self-pay | Admitting: Physician Assistant

## 2017-02-27 ENCOUNTER — Other Ambulatory Visit: Payer: Self-pay | Admitting: Physician Assistant

## 2017-02-27 DIAGNOSIS — I10 Essential (primary) hypertension: Secondary | ICD-10-CM

## 2017-02-27 MED ORDER — LISINOPRIL-HYDROCHLOROTHIAZIDE 10-12.5 MG PO TABS: 1.0000 | ORAL_TABLET | Freq: Every day | ORAL | 2 refills | Status: DC

## 2017-02-27 NOTE — Progress Notes (Signed)
Sent in [place] 10/12.5. The 20/12.5 dose that patient suggested is too big a dose increase. Patient should keep BP log, check once daily for the next month and schedule an appt for one month so we can recheck BP and labs. This has diuretic in it so causes more urination and electrolyte derangement.

## 2017-02-27 NOTE — Progress Notes (Signed)
Patient advised.

## 2017-03-27 ENCOUNTER — Encounter: Payer: Self-pay | Admitting: Physician Assistant

## 2017-03-27 ENCOUNTER — Ambulatory Visit (INDEPENDENT_AMBULATORY_CARE_PROVIDER_SITE_OTHER): Payer: Managed Care, Other (non HMO) | Admitting: Physician Assistant

## 2017-03-27 VITALS — BP 122/70 | HR 72 | Temp 98.2°F | Resp 16

## 2017-03-27 DIAGNOSIS — I10 Essential (primary) hypertension: Secondary | ICD-10-CM

## 2017-03-27 NOTE — Patient Instructions (Signed)
Hypertension °Hypertension is another name for high blood pressure. High blood pressure forces your heart to work harder to pump blood. This can cause problems over time. °There are two numbers in a blood pressure reading. There is a top number (systolic) over a bottom number (diastolic). It is best to have a blood pressure below 120/80. Healthy choices can help lower your blood pressure. You may need medicine to help lower your blood pressure if: °· Your blood pressure cannot be lowered with healthy choices. °· Your blood pressure is higher than 130/80. °Follow these instructions at home: °Eating and drinking  °· If directed, follow the DASH eating plan. This diet includes: °¨ Filling half of your plate at each meal with fruits and vegetables. °¨ Filling one quarter of your plate at each meal with whole grains. Whole grains include whole wheat pasta, brown rice, and whole grain bread. °¨ Eating or drinking low-fat dairy products, such as skim milk or low-fat yogurt. °¨ Filling one quarter of your plate at each meal with low-fat (lean) proteins. Low-fat proteins include fish, skinless chicken, eggs, beans, and tofu. °¨ Avoiding fatty meat, cured and processed meat, or chicken with skin. °¨ Avoiding premade or processed food. °· Eat less than 1,500 mg of salt (sodium) a day. °· Limit alcohol use to no more than 1 drink a day for nonpregnant women and 2 drinks a day for men. One drink equals 12 oz of beer, 5 oz of wine, or 1½ oz of hard liquor. °Lifestyle  °· Work with your doctor to stay at a healthy weight or to lose weight. Ask your doctor what the best weight is for you. °· Get at least 30 minutes of exercise that causes your heart to beat faster (aerobic exercise) most days of the week. This may include walking, swimming, or biking. °· Get at least 30 minutes of exercise that strengthens your muscles (resistance exercise) at least 3 days a week. This may include lifting weights or pilates. °· Do not use any  products that contain nicotine or tobacco. This includes cigarettes and e-cigarettes. If you need help quitting, ask your doctor. °· Check your blood pressure at home as told by your doctor. °· Keep all follow-up visits as told by your doctor. This is important. °Medicines  °· Take over-the-counter and prescription medicines only as told by your doctor. Follow directions carefully. °· Do not skip doses of blood pressure medicine. The medicine does not work as well if you skip doses. Skipping doses also puts you at risk for problems. °· Ask your doctor about side effects or reactions to medicines that you should watch for. °Contact a doctor if: °· You think you are having a reaction to the medicine you are taking. °· You have headaches that keep coming back (recurring). °· You feel dizzy. °· You have swelling in your ankles. °· You have trouble with your vision. °Get help right away if: °· You get a very bad headache. °· You start to feel confused. °· You feel weak or numb. °· You feel faint. °· You get very bad pain in your: °¨ Chest. °¨ Belly (abdomen). °· You throw up (vomit) more than once. °· You have trouble breathing. °Summary °· Hypertension is another name for high blood pressure. °· Making healthy choices can help lower blood pressure. If your blood pressure cannot be controlled with healthy choices, you may need to take medicine. °This information is not intended to replace advice given to you by your   health care provider. Make sure you discuss any questions you have with your health care provider. °Document Released: 05/28/2008 Document Revised: 11/07/2016 Document Reviewed: 11/07/2016 °Elsevier Interactive Patient Education © 2017 Elsevier Inc. ° °

## 2017-03-27 NOTE — Progress Notes (Signed)
Patient: [name] [name] Female    DOB: August 18, 1956   61 y.o.   MRN: [id number] Visit Date: 03/27/2017  Today's Provider: Trinna Post, PA-C   Chief Complaint  Patient presents with  . Hypertension   Subjective:    HPI   Feeling well. Husband just underwent shoulder replacement at [facility]. Sleep study scheduled on 04/11/2017.      Hypertension, follow-up:  BP Readings from Last 3 Encounters:  03/27/17 122/70  02/19/17 116/74  02/04/17 (!) 148/82    She was last seen for hypertension 6 weeks ago.  BP at that visit was 116/74. Management since that visit includes changing pt's Lisinopril 10 mg to Lisinopril-HCTZ 10-12.5 mg po qhs. She reports excellent compliance with treatment. She is not having side effects.  She has not been exercising since December due to husband's recent surgeries. She is adherent to low salt diet.   Outside blood pressures are mostly 130's-140's/80's. BP has been as high as 170/90. She is experiencing none.  Patient denies chest pain, chest pressure/discomfort, claudication, dyspnea, exertional chest pressure/discomfort, fatigue, irregular heart beat, lower extremity edema, near-syncope, orthopnea, palpitations and syncope.   Cardiovascular risk factors include family history of premature cardiovascular disease and hypertension.    Weight trend: did not weigh today. Pt refused. Wt Readings from Last 3 Encounters:  02/19/17 195 lb (88.5 kg)  02/04/17 195 lb (88.5 kg)  01/30/17 199 lb (90.3 kg)    Current diet: in general, a "healthy" diet    ------------------------------------------------------------------------    Allergies  Allergen Reactions  . Oxycodone-Acetaminophen Other (See Comments)     Current Outpatient Prescriptions:  .  calcium-vitamin D (OSCAL WITH D) 500-200 MG-UNIT tablet, Take 1 tablet by mouth., Disp: , Rfl:  .  escitalopram (LEXAPRO) 10 MG tablet, Take 1/2 tablet for first week, then increase to 1 full  tablet, Disp: 20 tablet, Rfl: 2 .  lisinopril-hydrochlorothiazide (PRINZIDE,ZESTORETIC) 10-12.5 MG tablet, Take 1 tablet by mouth daily., Disp: 30 tablet, Rfl: 2 .  Multiple Vitamin (MULTIVITAMIN) capsule, Take 1 capsule by mouth daily., Disp: , Rfl:  .  niacin 500 MG tablet, Take 500 mg by mouth 2 (two) times daily with a meal. , Disp: , Rfl:  .  butalbital-acetaminophen-caffeine (FIORICET, ESGIC) 50-325-40 MG tablet, Take 1-2 tablets by mouth every 6 (six) hours as needed for headache. (Patient not taking: Reported on 03/27/2017), Disp: 20 tablet, Rfl: 0  Review of Systems  Constitutional: Negative for activity change, appetite change, chills, diaphoresis, fatigue, fever and unexpected weight change.  Respiratory: Negative for shortness of breath.   Cardiovascular: Negative for chest pain, palpitations and leg swelling.    Social History  Substance Use Topics  . Smoking status: Never Smoker  . Smokeless tobacco: Never Used  . Alcohol use No     Comment: rare   Objective:   BP 122/70 (BP Location: Right Arm, Patient Position: Sitting, Cuff Size: Large)   Pulse 72   Temp 98.2 F (36.8 C) (Oral)   Resp 16  Vitals:   03/27/17 1337  BP: 122/70  Pulse: 72  Resp: 16  Temp: 98.2 F (36.8 C)  TempSrc: Oral     Physical Exam  Constitutional: She appears well-developed and well-nourished.  Cardiovascular: Normal rate, regular rhythm and normal heart sounds.   Pulmonary/Chest: Effort normal and breath sounds normal.  Skin: Skin is warm and dry.  Psychiatric: She has a normal mood and affect. Her behavior is normal.  Assessment & Plan:     1. Essential hypertension  Stable today, keep going with current medications. Will see her back in August to recheck labs and assess status. At that point if going well, likely switch to yearly visits.  No Follow-up on file.      The entirety of the information documented in the History of Present Illness, Review of Systems and  Physical Exam were personally obtained by me. Portions of this information were initially documented by Raquel Sarna D. and reviewed by me for thoroughness and accuracy.       Trinna Post, PA-C  [facility]

## 2017-04-04 ENCOUNTER — Encounter: Payer: Self-pay | Admitting: Physician Assistant

## 2017-04-29 NOTE — Telephone Encounter (Signed)
Can we please call [name] [name] and tell her that her home sleep study did not show sleep apnea? Thank you!

## 2017-04-30 ENCOUNTER — Ambulatory Visit: Payer: Managed Care, Other (non HMO) | Admitting: Physician Assistant

## 2017-04-30 ENCOUNTER — Encounter: Payer: Self-pay | Admitting: Family Medicine

## 2017-05-24 ENCOUNTER — Encounter: Payer: Self-pay | Admitting: Physician Assistant

## 2017-05-27 ENCOUNTER — Other Ambulatory Visit: Payer: Self-pay | Admitting: Physician Assistant

## 2017-05-27 DIAGNOSIS — I10 Essential (primary) hypertension: Secondary | ICD-10-CM

## 2017-05-27 MED ORDER — LISINOPRIL-HYDROCHLOROTHIAZIDE 10-12.5 MG PO TABS: 1.0000 | ORAL_TABLET | Freq: Every day | ORAL | 1 refills | Status: DC

## 2017-05-27 NOTE — Progress Notes (Signed)
Sent in refill for BP medication to [facility]. Please let patient know if she has refill request it's preferable to call office because when they're sent through mychart they tend to get lost in the shuffle, thank you.

## 2017-05-28 NOTE — Progress Notes (Signed)
Patient notified

## 2017-06-14 ENCOUNTER — Encounter: Payer: Self-pay | Admitting: Physician Assistant

## 2017-06-14 ENCOUNTER — Ambulatory Visit (INDEPENDENT_AMBULATORY_CARE_PROVIDER_SITE_OTHER): Payer: Managed Care, Other (non HMO) | Admitting: Physician Assistant

## 2017-06-14 VITALS — BP 100/62 | HR 72 | Temp 98.4°F | Resp 16

## 2017-06-14 DIAGNOSIS — R252 Cramp and spasm: Secondary | ICD-10-CM | POA: Diagnosis not present

## 2017-06-14 DIAGNOSIS — R002 Palpitations: Secondary | ICD-10-CM

## 2017-06-14 DIAGNOSIS — Z79899 Other long term (current) drug therapy: Secondary | ICD-10-CM | POA: Diagnosis not present

## 2017-06-14 NOTE — Progress Notes (Signed)
Patient: [name] [name] Female    DOB: September 21, 1956   61 y.o.   MRN: [id number] Visit Date: 06/14/2017  Today's Provider: Trinna Post, PA-C   Chief Complaint  Patient presents with  . Leg Cramps   Subjective:    HPI   Leg Cramps [name] [name] is a 61 y/o woman with history of HTN on 10-12.5 Prinzide presenting today with leg cramps and palpitations. The leg cramps have been occurring for about month. The cramps occur in her legs, arches of feet, and ankles. The cramping in the ankle yesterday was severe that pt thought she fractured the ankle. She is concerned that the HCTZ can be causing hypokalemia. She also notes that the lisinopril is causing dry cough. She is not bothered by cough, it is helped by a glass of water at night.  Four days ago, pt noticed that she had irregular pulse, a chest heaviness, and palpitations. This has not occurred since, but does notice an intermittent chest heaviness. She said the palpitations lasted about 30 minutes and then resolved. Two episodes of chest heaviness, not chest pain, no radiation, no diaphoresis or weakness. This too has resolved.  Allergies  Allergen Reactions  . Oxycodone-Acetaminophen Other (See Comments)     Current Outpatient Prescriptions:  .  calcium-vitamin D (OSCAL WITH D) 500-200 MG-UNIT tablet, Take 1 tablet by mouth., Disp: , Rfl:  .  escitalopram (LEXAPRO) 10 MG tablet, Take 1/2 tablet for first week, then increase to 1 full tablet, Disp: 20 tablet, Rfl: 2 .  lisinopril-hydrochlorothiazide (PRINZIDE,ZESTORETIC) 10-12.5 MG tablet, Take 1 tablet by mouth daily., Disp: 90 tablet, Rfl: 1 .  Multiple Vitamin (MULTIVITAMIN) capsule, Take 1 capsule by mouth daily., Disp: , Rfl:  .  niacin 500 MG tablet, Take 500 mg by mouth 2 (two) times daily with a meal. , Disp: , Rfl:   Review of Systems  Constitutional: Negative for activity change, appetite change, chills, diaphoresis, fatigue, fever and unexpected weight  change.  Respiratory: Positive for chest tightness. Negative for shortness of breath.   Cardiovascular: Positive for palpitations. Negative for chest pain and leg swelling.  Musculoskeletal:       Muscle cramping present    Social History  Substance Use Topics  . Smoking status: Never Smoker  . Smokeless tobacco: Never Used  . Alcohol use No     Comment: rare   Objective:   BP 100/62 (BP Location: Left Arm, Patient Position: Sitting, Cuff Size: Large)   Pulse 72   Temp 98.4 F (36.9 C) (Oral)   Resp 16   SpO2 95%  Vitals:   06/14/17 1058  BP: 100/62  Pulse: 72  Resp: 16  Temp: 98.4 F (36.9 C)  TempSrc: Oral  SpO2: 95%     Physical Exam      Assessment & Plan:     1. Leg cramps  Will check labs as below. Can increase potassium intake with high potassium foods. She can go back to taking her 10 mg Lisinopril until we get results of labs since she is going out of town to [facility] and is concerned about getting worse. No cardiac symptoms today, will defer EKG, can explore if symptoms reoccur.  - Comprehensive metabolic panel - CK (Creatine Kinase) - Magnesium  2. Long term current use of diuretic  - Comprehensive metabolic panel  3. Palpitations  Resolved, one episode. Can explore further if persistent.   - Comprehensive metabolic panel  The entirety  of the information documented in the History of Present Illness, Review of Systems and Physical Exam were personally obtained by me. Portions of this information were initially documented by Raquel Sarna D. and reviewed by me for thoroughness and accuracy.   Return if symptoms worsen or fail to improve.       Trinna Post, PA-C  [facility]

## 2017-06-14 NOTE — Patient Instructions (Signed)

## 2017-06-15 LAB — COMPREHENSIVE METABOLIC PANEL
ALT: 13 IU/L (ref 0–32)
AST: 16 IU/L (ref 0–40)
Albumin/Globulin Ratio: 1.5 (ref 1.2–2.2)
Albumin: 4.3 g/dL (ref 3.6–4.8)
Alkaline Phosphatase: 82 IU/L (ref 39–117)
BUN/Creatinine Ratio: 13 (ref 12–28)
BUN: 13 mg/dL (ref 8–27)
Bilirubin Total: 0.4 mg/dL (ref 0.0–1.2)
CO2: 25 mmol/L (ref 20–29)
Calcium: 9.9 mg/dL (ref 8.7–10.3)
Chloride: 103 mmol/L (ref 96–106)
Creatinine, Ser: 1 mg/dL (ref 0.57–1.00)
GFR calc Af Amer: 70 mL/min/{1.73_m2} (ref >59)
GFR calc non Af Amer: 61 mL/min/{1.73_m2} (ref >59)
Globulin, Total: 2.8 g/dL (ref 1.5–4.5)
Glucose: 105 mg/dL — ABNORMAL HIGH (ref 65–99)
Potassium: 4.6 mmol/L (ref 3.5–5.2)
Sodium: 142 mmol/L (ref 134–144)
Total Protein: 7.1 g/dL (ref 6.0–8.5)

## 2017-06-15 LAB — CK: Total CK: 88 U/L (ref 24–173)

## 2017-06-15 LAB — MAGNESIUM: Magnesium: 2.1 mg/dL (ref 1.6–2.3)

## 2017-08-20 ENCOUNTER — Encounter: Payer: Self-pay | Admitting: Physician Assistant

## 2017-08-20 ENCOUNTER — Ambulatory Visit (INDEPENDENT_AMBULATORY_CARE_PROVIDER_SITE_OTHER): Payer: Managed Care, Other (non HMO) | Admitting: Physician Assistant

## 2017-08-20 VITALS — BP 112/64 | HR 68 | Temp 98.3°F | Resp 16

## 2017-08-20 DIAGNOSIS — I10 Essential (primary) hypertension: Secondary | ICD-10-CM

## 2017-08-20 DIAGNOSIS — R739 Hyperglycemia, unspecified: Secondary | ICD-10-CM

## 2017-08-20 LAB — POCT GLYCOSYLATED HEMOGLOBIN (HGB A1C): Hemoglobin A1C: 5.9

## 2017-08-20 MED ORDER — LOSARTAN POTASSIUM-HCTZ 50-12.5 MG PO TABS: 1.0000 | ORAL_TABLET | Freq: Every day | ORAL | 0 refills | Status: DC

## 2017-08-20 NOTE — Patient Instructions (Signed)

## 2017-08-20 NOTE — Progress Notes (Signed)
Patient: [name] [name] Female    DOB: 10-03-1956   61 y.o.   MRN: [id number] Visit Date: 08/20/2017  Today's Provider: Trinna Post, PA-C   Chief Complaint  Patient presents with  . Hyperglycemia  . Hypertension   Subjective:    Hypertension  This is a chronic problem. The problem is unchanged. The problem is controlled. Pertinent negatives include no anxiety, blurred vision, chest pain, headaches, malaise/fatigue, neck pain, orthopnea, palpitations, peripheral edema, PND, shortness of breath or sweats. There are no associated agents to hypertension. Past treatments include ACE inhibitors. The current treatment provides significant improvement. Compliance problems include medication side effects (Pt reports lisinopril is causing a cough.).      Hyperglycemia, Follow-up:   Lab Results  Component Value Date   HGBA1C 5.9 01/29/2017   HGBA1C 5.6 04/08/2012   Last seen for hyperglycemia 6 months ago.  Management since then includes None. She reports excellent compliance with treatment. She is not having side effects.  Current symptoms include none and have been stable. Home blood sugar records: Not checking at home right now.  Episodes of hypoglycemia? no   Most Recent Eye Exam: UTD Weight trend: stable Prior visit with dietician: no Current diet: in general, a "healthy" diet   Current exercise: Some  Also wondering if she can get PAP smears here, previously seen at [facility] clinic. ------------------------------------------------------------------------     Wt Readings from Last 3 Encounters:  02/19/17 195 lb (88.5 kg)  02/04/17 195 lb (88.5 kg)  01/30/17 199 lb (90.3 kg)    ------------------------------------------------------------------------      Allergies  Allergen Reactions  . Oxycodone-Acetaminophen Other (See Comments)     Current Outpatient Prescriptions:  .  calcium-vitamin D (OSCAL WITH D) 500-200 MG-UNIT tablet, Take 1  tablet by mouth., Disp: , Rfl:  .  escitalopram (LEXAPRO) 10 MG tablet, Take 1/2 tablet for first week, then increase to 1 full tablet, Disp: 20 tablet, Rfl: 2 .  lisinopril-hydrochlorothiazide (PRINZIDE,ZESTORETIC) 10-12.5 MG tablet, Take 1 tablet by mouth daily., Disp: 90 tablet, Rfl: 1 .  Multiple Vitamin (MULTIVITAMIN) capsule, Take 1 capsule by mouth daily., Disp: , Rfl:  .  niacin 500 MG tablet, Take 500 mg by mouth 2 (two) times daily with a meal. , Disp: , Rfl:   Review of Systems  Constitutional: Negative.  Negative for malaise/fatigue.  Eyes: Negative for blurred vision.  Respiratory: Positive for cough. Negative for apnea, choking, chest tightness, shortness of breath, wheezing and stridor.   Cardiovascular: Negative.  Negative for chest pain, palpitations, orthopnea and PND.  Gastrointestinal: Negative.   Musculoskeletal: Negative for neck pain.  Neurological: Negative for dizziness, light-headedness and headaches.    Social History  Substance Use Topics  . Smoking status: Never Smoker  . Smokeless tobacco: Never Used  . Alcohol use No     Comment: rare   Objective:   BP 112/64 (BP Location: Left Arm, Patient Position: Sitting, Cuff Size: Large)   Pulse 68   Temp 98.3 F (36.8 C) (Oral)   Resp 16  Vitals:   08/20/17 0817  BP: 112/64  Pulse: 68  Resp: 16  Temp: 98.3 F (36.8 C)  TempSrc: Oral     Physical Exam  Constitutional: She is oriented to person, place, and time. She appears well-developed and well-nourished. No distress.  Cardiovascular: Normal rate and regular rhythm.   Pulmonary/Chest: Effort normal and breath sounds normal.  Musculoskeletal: She exhibits no edema.  No LE  edema.  Neurological: She is alert and oriented to person, place, and time.  Skin: Skin is warm and dry.  Psychiatric: She has a normal mood and affect. Her behavior is normal.        Assessment & Plan:     1. Essential hypertension  Change Lisinopril to Losartan. Recheck  in October when she comes for PAP/mammogram. [facility].   - losartan-hydrochlorothiazide (HYZAAR) 50-12.5 MG tablet; Take 1 tablet by mouth daily.  Dispense: 30 tablet; Refill: 0  2. Hyperglycemia  Prediabetes, stable from 6 mo ago with A1C 5.9%. Please work on diet and lifestyle modifications.  - POCT glycosylated hemoglobin (Hb A1C)  Return for CPE.  The entirety of the information documented in the History of Present Illness, Review of Systems and Physical Exam were personally obtained by me. Portions of this information were initially documented by Ashley Royalty, CMA and reviewed by me for thoroughness and accuracy.        Trinna Post, PA-C  [facility]

## 2017-08-25 ENCOUNTER — Encounter: Payer: Self-pay | Admitting: Physician Assistant

## 2017-08-25 DIAGNOSIS — I10 Essential (primary) hypertension: Secondary | ICD-10-CM

## 2017-08-27 MED ORDER — LOSARTAN POTASSIUM-HCTZ 50-12.5 MG PO TABS: 1.0000 | ORAL_TABLET | Freq: Every day | ORAL | 1 refills | Status: DC

## 2017-09-05 ENCOUNTER — Encounter: Payer: Self-pay | Admitting: Physician Assistant

## 2017-09-24 ENCOUNTER — Ambulatory Visit (INDEPENDENT_AMBULATORY_CARE_PROVIDER_SITE_OTHER): Payer: Managed Care, Other (non HMO) | Admitting: Physician Assistant

## 2017-09-24 ENCOUNTER — Encounter: Payer: Self-pay | Admitting: Physician Assistant

## 2017-09-24 VITALS — BP 116/76 | HR 84 | Temp 98.0°F | Resp 16 | Ht 64.5 in | Wt 191.0 lb

## 2017-09-24 DIAGNOSIS — Z1159 Encounter for screening for other viral diseases: Secondary | ICD-10-CM | POA: Diagnosis not present

## 2017-09-24 DIAGNOSIS — E781 Pure hyperglyceridemia: Secondary | ICD-10-CM

## 2017-09-24 DIAGNOSIS — I10 Essential (primary) hypertension: Secondary | ICD-10-CM | POA: Diagnosis not present

## 2017-09-24 DIAGNOSIS — M8580 Other specified disorders of bone density and structure, unspecified site: Secondary | ICD-10-CM | POA: Diagnosis not present

## 2017-09-24 DIAGNOSIS — Z0000 Encounter for general adult medical examination without abnormal findings: Secondary | ICD-10-CM | POA: Diagnosis not present

## 2017-09-24 DIAGNOSIS — Z114 Encounter for screening for human immunodeficiency virus [HIV]: Secondary | ICD-10-CM | POA: Diagnosis not present

## 2017-09-24 DIAGNOSIS — Z1239 Encounter for other screening for malignant neoplasm of breast: Secondary | ICD-10-CM

## 2017-09-24 DIAGNOSIS — Z1231 Encounter for screening mammogram for malignant neoplasm of breast: Secondary | ICD-10-CM | POA: Diagnosis not present

## 2017-09-24 NOTE — Patient Instructions (Signed)
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
## 2017-09-25 LAB — HEPATITIS C ANTIBODY
Hepatitis C Ab: NONREACTIVE
SIGNAL TO CUT-OFF: 0.06 (ref <1.00)

## 2017-09-25 LAB — HIV ANTIBODY (ROUTINE TESTING W REFLEX): HIV 1&2 Ab, 4th Generation: NONREACTIVE

## 2017-10-16 ENCOUNTER — Ambulatory Visit (INDEPENDENT_AMBULATORY_CARE_PROVIDER_SITE_OTHER): Payer: Managed Care, Other (non HMO) | Admitting: Physician Assistant

## 2017-10-16 ENCOUNTER — Encounter: Payer: Self-pay | Admitting: Physician Assistant

## 2017-10-16 VITALS — BP 124/82 | HR 84 | Temp 98.0°F | Resp 16 | Wt 186.0 lb

## 2017-10-16 DIAGNOSIS — L237 Allergic contact dermatitis due to plants, except food: Secondary | ICD-10-CM | POA: Diagnosis not present

## 2017-10-16 MED ORDER — PREDNISONE 10 MG (21) PO TBPK: ORAL_TABLET | ORAL | 0 refills | Status: DC
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
## 2017-10-16 NOTE — Patient Instructions (Signed)
Poison Ivy Dermatitis Poison ivy dermatitis is redness and soreness (inflammation) of the skin. It is caused by a chemical that is found on the leaves of the poison ivy plant. You may also have itching, a rash, and blisters. Symptoms often clear up in 1-2 weeks. You may get this condition by touching a poison ivy plant. You can also get it by touching something that has the chemical on it. This may include animals or objects that have come in contact with the plant. Follow these instructions at home: General instructions  Take or apply over-the-counter and prescription medicines only as told by your doctor.  If you touch poison ivy, wash your skin with soap and cold water right away.  Use hydrocortisone creams or calamine lotion as needed to help with itching.  Take oatmeal baths as needed. Use colloidal oatmeal. You can get this at a pharmacy or grocery store. Follow the instructions on the package.  Do not scratch or rub your skin.  While you have the rash, wash your clothes right after you wear them. Prevention  Know what poison ivy looks like so you can avoid it. This plant has three leaves with flowering branches on a single stem. The leaves are glossy. They have uneven edges that come to a point at the front.  If you have touched poison ivy, wash with soap and water right away. Be sure to wash under your fingernails.  When hiking or camping, wear long pants, a long-sleeved shirt, tall socks, and hiking boots. You can also use a lotion on your skin that helps to prevent contact with the chemical on the plant.  If you think that your clothes or outdoor gear came in contact with poison ivy, rinse them off with a garden hose before you bring them inside your house. Contact a doctor if:  You have open sores in the rash area.  You have more redness, swelling, or pain in the affected area.  You have redness that spreads beyond the rash area.  You have fluid, blood, or pus coming from  the affected area.  You have a fever.  You have a rash over a large area of your body.  You have a rash on your eyes, mouth, or genitals.  Your rash does not get better after a few days. Get help right away if:  Your face swells or your eyes swell shut.  You have trouble breathing.  You have trouble swallowing. This information is not intended to replace advice given to you by your health care provider. Make sure you discuss any questions you have with your health care provider. Document Released: 01/12/2011 Document Revised: 05/17/2016 Document Reviewed: 05/18/2015 Elsevier Interactive Patient Education  2018 Elsevier Inc.  

## 2017-12-23 ENCOUNTER — Other Ambulatory Visit: Payer: Managed Care, Other (non HMO)

## 2018-01-22 ENCOUNTER — Ambulatory Visit
Admission: RE | Admit: 2018-01-22 | Discharge: 2018-01-22 | Disposition: A | Discharge status: Home / Self Care | Payer: Managed Care, Other (non HMO) | Source: Ambulatory Visit | Attending: Physician Assistant | Admitting: Physician Assistant

## 2018-01-22 ENCOUNTER — Telehealth: Payer: Self-pay

## 2018-01-22 DIAGNOSIS — M8580 Other specified disorders of bone density and structure, unspecified site: Secondary | ICD-10-CM

## 2018-01-22 DIAGNOSIS — Z1231 Encounter for screening mammogram for malignant neoplasm of breast: Secondary | ICD-10-CM | POA: Insufficient documentation

## 2018-01-22 DIAGNOSIS — M8588 Other specified disorders of bone density and structure, other site: Secondary | ICD-10-CM | POA: Diagnosis not present

## 2018-01-22 DIAGNOSIS — Z1239 Encounter for other screening for malignant neoplasm of breast: Secondary | ICD-10-CM

## 2018-01-22 NOTE — Telephone Encounter (Signed)
-----   Message from Trinna Post, [place] sent at 01/22/2018  2:12 PM EST ----- DEXA shows continued osteopenia. Please ensure 1200 mg calcium daily and 800 IU vitamin D daily. Please try to incorporate weight bearing exercises.

## 2018-01-22 NOTE — Telephone Encounter (Signed)
Tried calling, pt's voicemail box is full.   Thanks,   -Mickel Baas

## 2018-01-24 NOTE — Telephone Encounter (Signed)
Pt advised.   Thanks,   -Laura  

## 2018-02-05 IMAGING — MG MM DIGITAL SCREENING BILAT W/ CAD
4 series · 4 of 4 positions shown · non-contrast
Comparison: Previous exam(s).

CLINICAL DATA: Screening.

EXAM:
DIGITAL SCREENING BILATERAL MAMMOGRAM WITH CAD

[R CC]
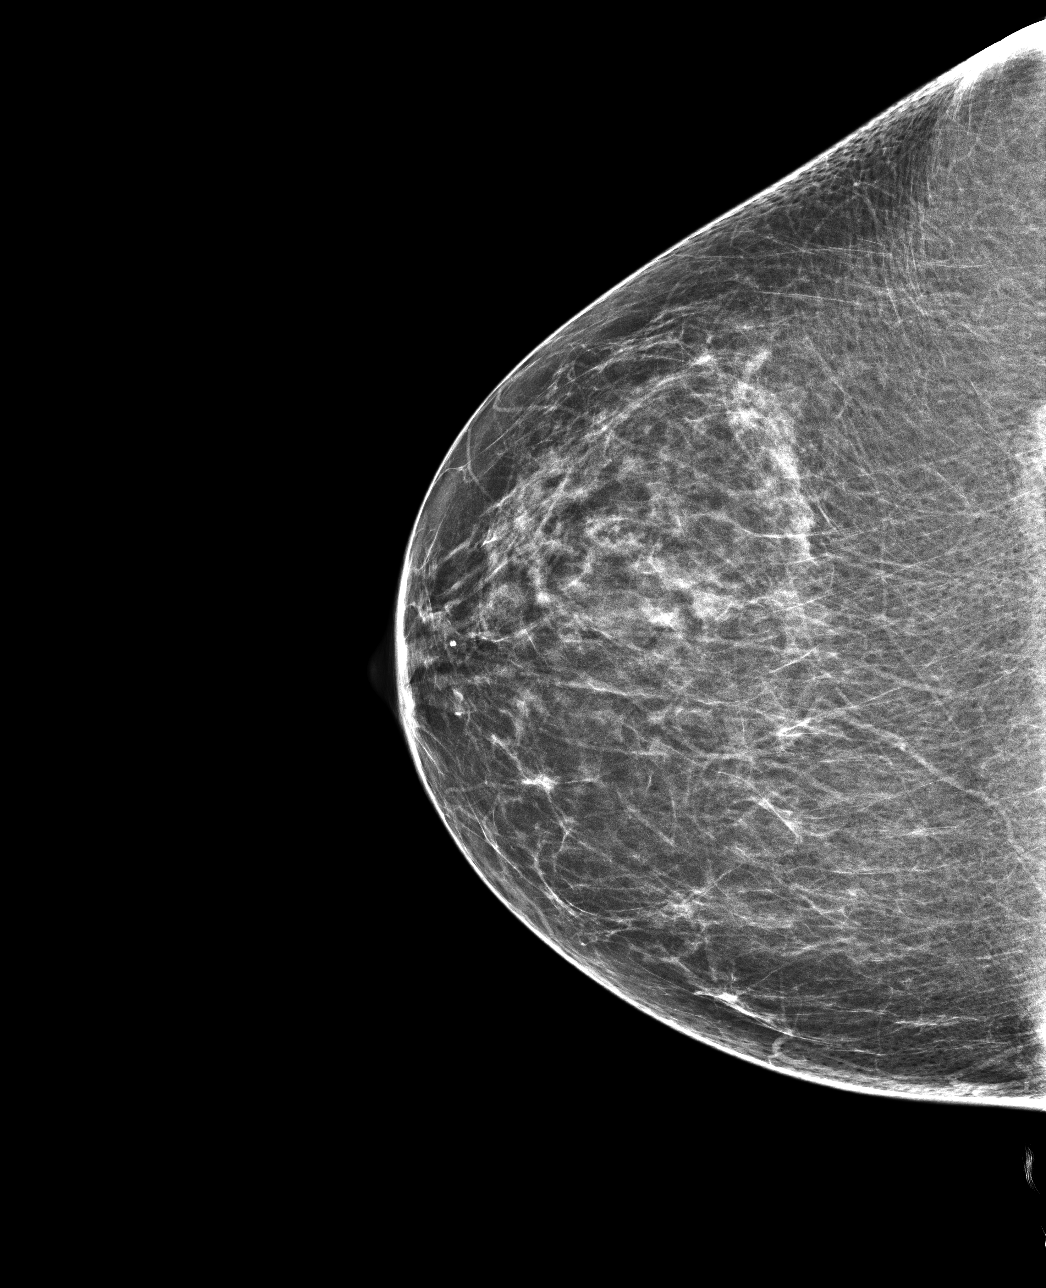

[L CC]
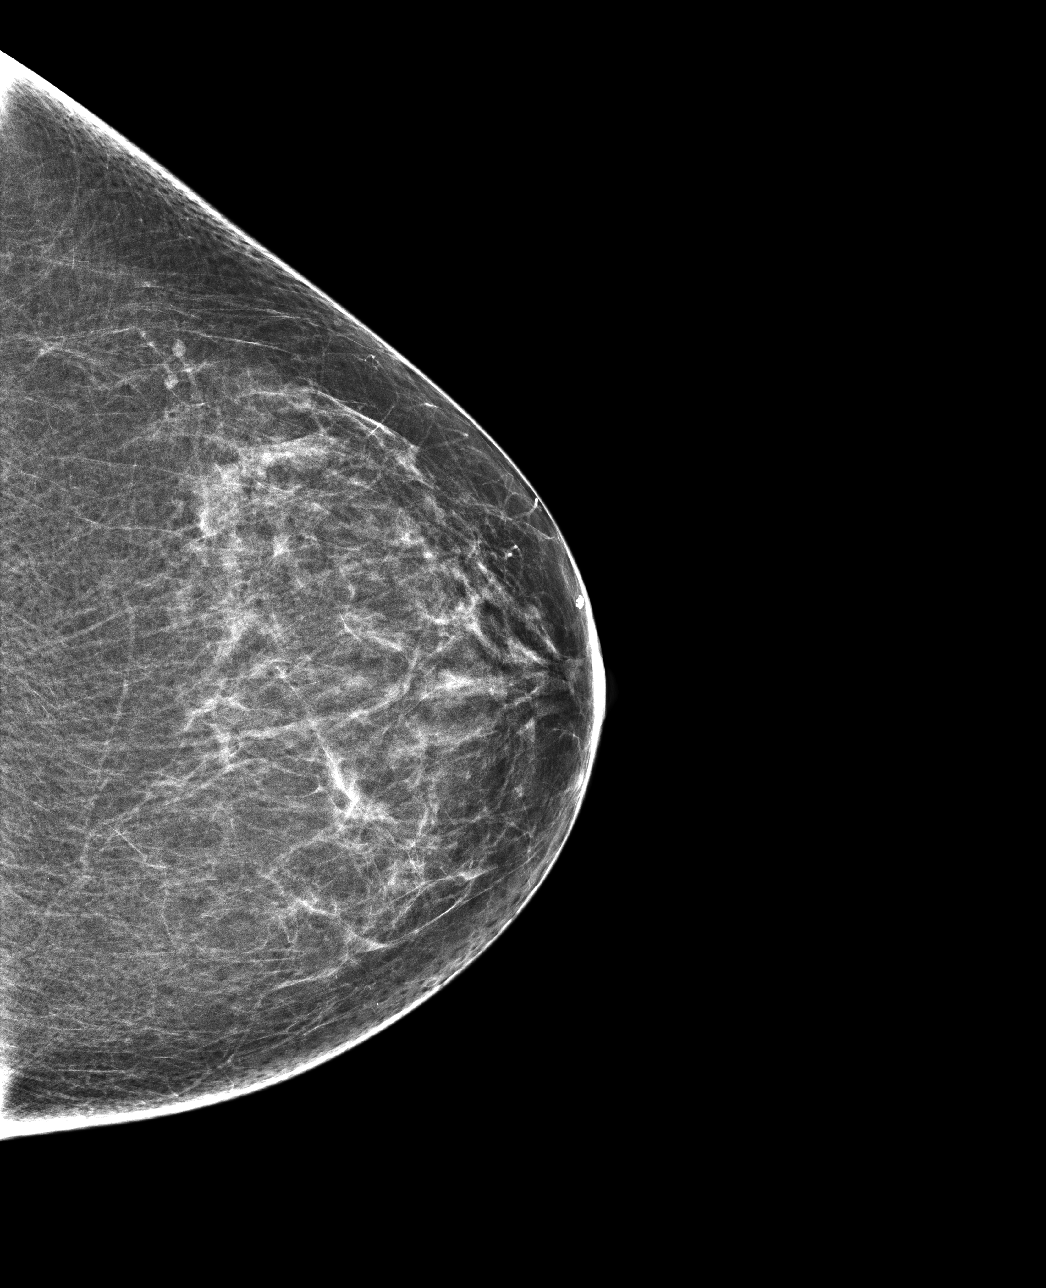

[R MLO]
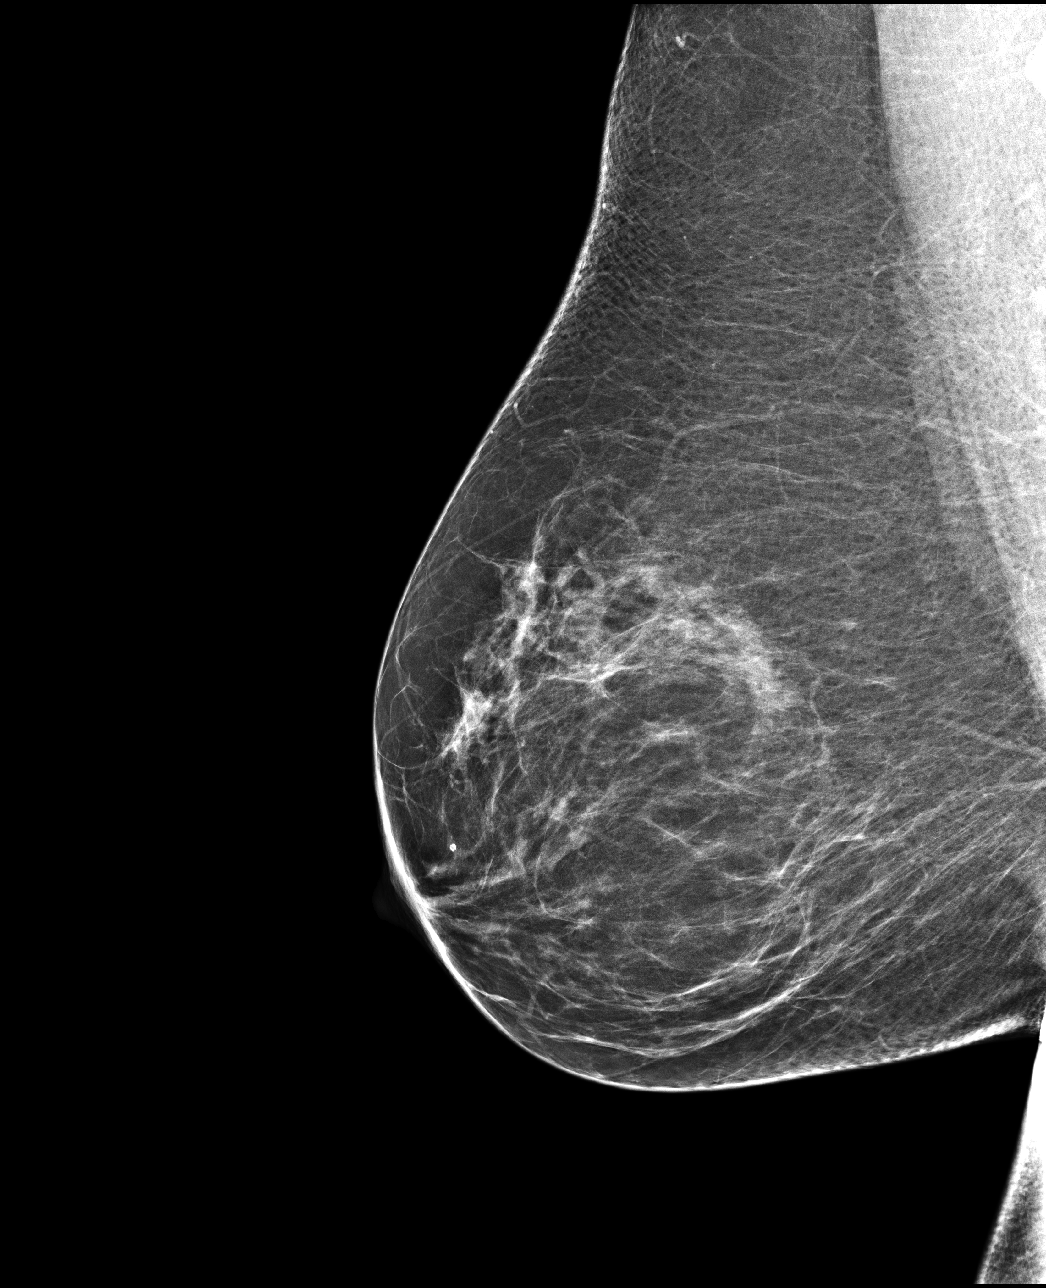

[L MLO]
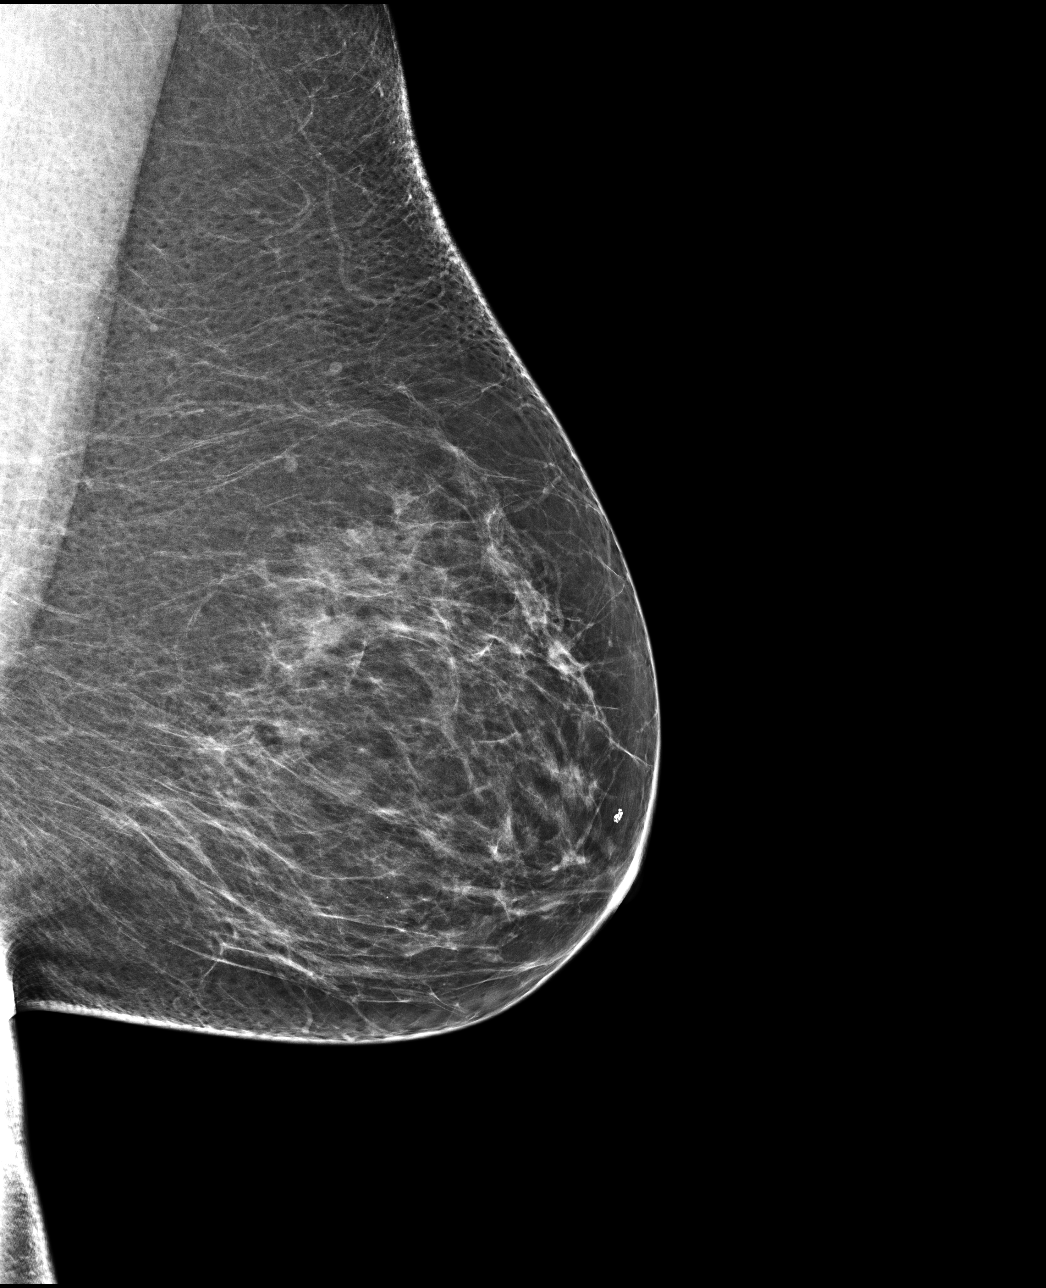

[4 of 4 positions shown; findings below may reference images not displayed]

ACR Breast Density Category b: There are scattered areas of
fibroglandular density.
FINDINGS: There are no findings suspicious for malignancy. Images were
processed with CAD.
IMPRESSION: No mammographic evidence of malignancy. A result letter of this
screening mammogram will be mailed directly to the patient.

RECOMMENDATION:
Screening mammogram in one year. (Code:AS-G-LCT)

BI-RADS CATEGORY  1: Negative.

## 2018-02-15 ENCOUNTER — Encounter: Payer: Self-pay | Admitting: Family Medicine

## 2018-02-15 ENCOUNTER — Ambulatory Visit (INDEPENDENT_AMBULATORY_CARE_PROVIDER_SITE_OTHER): Payer: Managed Care, Other (non HMO) | Admitting: Family Medicine

## 2018-02-15 VITALS — BP 80/60 | HR 100 | Temp 99.4°F | Resp 20 | Wt 189.0 lb

## 2018-02-15 DIAGNOSIS — E861 Hypovolemia: Secondary | ICD-10-CM | POA: Diagnosis not present

## 2018-02-15 DIAGNOSIS — J069 Acute upper respiratory infection, unspecified: Secondary | ICD-10-CM | POA: Diagnosis not present

## 2018-02-15 DIAGNOSIS — I9589 Other hypotension: Secondary | ICD-10-CM

## 2018-02-15 MED ORDER — HYDROCOD POLST-CPM POLST ER 10-8 MG/5ML PO SUER: 5.0000 mL | Freq: Three times a day (TID) | ORAL | 0 refills | Status: DC | PRN

## 2018-02-15 NOTE — Progress Notes (Signed)
Patient: [name] [name] Female    DOB: [birth year]   62 y.o.   MRN: [id number] Visit Date: 02/15/2018  Today's Provider: Lavon Paganini, MD   Chief Complaint  Patient presents with  . URI   Subjective:    HPI Upper Respiratory Infection: Patient complains of symptoms of a URI, possible sinusitis. Symptoms include bilateral ear pain, cough, fever and sore throat. Onset of symptoms was 3 days ago, gradually worsening since that time. She also c/o bilateral ear pressure/pain, congestion, cough described as productive of yellow sputum, fever 101.2 last night, productive cough with  yellow colored sputum and sore throat for the past 1 days .  She is drinking plenty of fluids. Evaluation to date: none. Treatment to date: cough suppressants, decongestants and Ibuprofen.  Feels weak. Denies dizziness, lightheadedness, syncope, CP, SOB.  States she has had similar episodes in the past and Rx cough syrup is the only thing that helps.    Allergies  Allergen Reactions  . Oxycodone-Acetaminophen Other (See Comments)     Current Outpatient Medications:  .  calcium-vitamin D (OSCAL WITH D) 500-200 MG-UNIT tablet, Take 1 tablet by mouth., Disp: , Rfl:  .  losartan-hydrochlorothiazide (HYZAAR) 50-12.5 MG tablet, Take 1 tablet by mouth daily., Disp: 90 tablet, Rfl: 1 .  Multiple Vitamin (MULTIVITAMIN) capsule, Take 1 capsule by mouth daily., Disp: , Rfl:  .  niacin 500 MG tablet, Take 500 mg by mouth 2 (two) times daily with a meal. , Disp: , Rfl:   Review of Systems  Constitutional: Positive for fever.  HENT: Positive for congestion, ear pain, postnasal drip, rhinorrhea, sinus pressure, sinus pain and sore throat. Negative for ear discharge, hearing loss, mouth sores, sneezing, tinnitus, trouble swallowing and voice change.   Respiratory: Positive for cough. Negative for chest tightness, shortness of breath and wheezing.   Cardiovascular: Negative.   Gastrointestinal: Negative.     Musculoskeletal: Negative.   Skin: Negative.   Neurological: Positive for weakness and headaches. Negative for dizziness, tremors, seizures, syncope, speech difficulty, light-headedness and numbness.    Social History   Tobacco Use  . Smoking status: Never Smoker  . Smokeless tobacco: Never Used  Substance Use Topics  . Alcohol use: No    Comment: rare   Objective:   BP (!) 80/60 (BP Location: Right Arm, Patient Position: Sitting, Cuff Size: Large)   Pulse 100   Temp 99.4 F (37.4 C) (Oral)   Resp 20   Wt 189 lb (85.7 kg)   SpO2 99%   BMI 31.94 kg/m  Vitals:   02/15/18 0929  BP: (!) 80/60  Pulse: 100  Resp: 20  Temp: 99.4 F (37.4 C)  TempSrc: Oral  SpO2: 99%  Weight: 189 lb (85.7 kg)     Physical Exam  Constitutional: She appears well-developed and well-nourished. No distress.  HENT:  Head: Normocephalic and atraumatic.  Right Ear: Tympanic membrane, external ear and ear canal normal.  Left Ear: Tympanic membrane, external ear and ear canal normal.  Nose: Rhinorrhea present. Right sinus exhibits no maxillary sinus tenderness and no frontal sinus tenderness. Left sinus exhibits no maxillary sinus tenderness and no frontal sinus tenderness.  Mouth/Throat: Uvula is midline. Mucous membranes are dry. Posterior oropharyngeal erythema present. No oropharyngeal exudate, posterior oropharyngeal edema or tonsillar abscesses.  Eyes: Conjunctivae and EOM are normal. Pupils are equal, round, and reactive to light. Right eye exhibits no discharge. Left eye exhibits no discharge. No scleral icterus.  Neck: Neck  supple.  Cardiovascular: Normal rate, regular rhythm, normal heart sounds and intact distal pulses.  No murmur heard. Pulmonary/Chest: Effort normal and breath sounds normal. No respiratory distress. She has no wheezes. She has no rales.  Harsh cough  Musculoskeletal: She exhibits no edema.  Lymphadenopathy:    She has no cervical adenopathy.  Neurological: She is  alert.  Skin: Skin is warm and dry. No rash noted.  Psychiatric: She has a normal mood and affect. Her behavior is normal.  Vitals reviewed.       Assessment & Plan:   1. Viral URI - no signs of bacterial pneumonia, AOM, strep throat - likely viral URI with harsh cough - patient declines flu swab and she is out of Tamiflu window - will give Tussionex to help with cough and sleep - strict return precautions discussed as well as symptomatic treatment and return precautions  2. Hypotension due to hypovolemia - BP low today, but patient is asymptomatic - appears hypovolemic on exam and has poor PO intake - encouraged pushing fluids to rehydrate - hold antihypertensives until BP normalizes - return precautions discussed    Meds ordered this encounter  Medications  . chlorpheniramine-HYDROcodone (TUSSIONEX PENNKINETIC ER) 10-8 MG/5ML SUER    Sig: Take 5 mLs by mouth 3 (three) times daily as needed for cough.    Dispense:  140 mL    Refill:  0     Return if symptoms worsen or fail to improve.   The entirety of the information documented in the History of Present Illness, Review of Systems and Physical Exam were personally obtained by me. Portions of this information were initially documented by Lynford Humphrey, CMA and reviewed by me for thoroughness and accuracy.    Virginia Crews, MD, MPH [facility] 02/15/2018 9:49 AM

## 2018-02-15 NOTE — Patient Instructions (Signed)
Upper Respiratory Infection, Adult Most upper respiratory infections (URIs) are caused by a virus. A URI affects the nose, throat, and upper air passages. The most common type of URI is often called "the common cold." Follow these instructions at home:  Take medicines only as told by your doctor.  Gargle warm saltwater or take cough drops to comfort your throat as told by your doctor.  Use a warm mist humidifier or inhale steam from a shower to increase air moisture. This may make it easier to breathe.  Drink enough fluid to keep your pee (urine) clear or pale yellow.  Eat soups and other clear broths.  Have a healthy diet.  Rest as needed.  Go back to work when your fever is gone or your doctor says it is okay. ? You may need to stay home longer to avoid giving your URI to others. ? You can also wear a face mask and wash your hands often to prevent spread of the virus.  Use your inhaler more if you have asthma.  Do not use any tobacco products, including cigarettes, chewing tobacco, or electronic cigarettes. If you need help quitting, ask your doctor. Contact a doctor if:  You are getting worse, not better.  Your symptoms are not helped by medicine.  You have chills.  You are getting more short of breath.  You have brown or red mucus.  You have yellow or brown discharge from your nose.  You have pain in your face, especially when you bend forward.  You have a fever.  You have puffy (swollen) neck glands.  You have pain while swallowing.  You have white areas in the back of your throat. Get help right away if:  You have very bad or constant: ? Headache. ? Ear pain. ? Pain in your forehead, behind your eyes, and over your cheekbones (sinus pain). ? Chest pain.  You have long-lasting (chronic) lung disease and any of the following: ? Wheezing. ? Long-lasting cough. ? Coughing up blood. ? A change in your usual mucus.  You have a stiff neck.  You have  changes in your: ? Vision. ? Hearing. ? Thinking. ? Mood. This information is not intended to replace advice given to you by your health care provider. Make sure you discuss any questions you have with your health care provider. Document Released: 05/28/2008 Document Revised: 08/12/2016 Document Reviewed: 03/17/2014 Elsevier Interactive Patient Education  2018 Elsevier Inc.  

## 2018-02-18 ENCOUNTER — Encounter: Payer: Self-pay | Admitting: Physician Assistant

## 2018-02-18 ENCOUNTER — Ambulatory Visit: Payer: Managed Care, Other (non HMO) | Admitting: Physician Assistant

## 2018-02-18 VITALS — BP 134/86 | HR 86 | Temp 99.2°F | Resp 16 | Wt 189.0 lb

## 2018-02-18 DIAGNOSIS — J40 Bronchitis, not specified as acute or chronic: Secondary | ICD-10-CM | POA: Diagnosis not present

## 2018-02-18 MED ORDER — PREDNISONE 10 MG (21) PO TBPK: ORAL_TABLET | ORAL | 0 refills | Status: DC

## 2018-02-18 NOTE — Patient Instructions (Signed)

## 2018-02-18 NOTE — Progress Notes (Signed)
Patient: [name] [name] Female    DOB: 1956-01-17   62 y.o.   MRN: [id number] Visit Date: 02/19/2018  Today's Provider: Trinna Post, PA-C   Chief Complaint  Patient presents with  . Cough   Subjective:    [name] [name] is a 62 y/o woman who presents today with cough, congestion. She [facility] and diagnosed with URI. Her symptoms improved but then got worse. She is not having facial pain or nasal discharge. Her most pressing symptoms is a persistent cough.   Her ganddaughter is coming home tomorrow from the NICU after suffering several health crises including nerve plexus injury at birth and bowel perforation with subsequent brain damage. 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 This has been a significant source of stress for her.  Cough  This is a new problem. The current episode started in the past 7 days. The problem occurs constantly. The cough is productive of sputum. Associated symptoms include a fever (Pt had a temp of 101 over the weekend but this has improved. ), headaches, postnasal drip, a sore throat and shortness of breath. Pertinent negatives include no chills, ear pain, eye redness, rhinorrhea or wheezing.       Allergies  Allergen Reactions  . Oxycodone-Acetaminophen Other (See Comments)     Current Outpatient Medications:  .  calcium-vitamin D (OSCAL WITH D) 500-200 MG-UNIT tablet, Take 1 tablet by mouth., Disp: , Rfl:  .  chlorpheniramine-HYDROcodone (TUSSIONEX PENNKINETIC ER) 10-8 MG/5ML SUER, Take 5 mLs by mouth 3 (three) times daily as needed for cough., Disp: 140 mL, Rfl: 0 .  losartan-hydrochlorothiazide (HYZAAR) 50-12.5 MG tablet, Take 1 tablet by mouth daily., Disp: 90 tablet, Rfl: 1 .  Multiple Vitamin (MULTIVITAMIN) capsule, Take 1 capsule by mouth daily., Disp: , Rfl:  .  niacin 500 MG tablet, Take 500 mg by mouth 2 (two) times daily with a meal. , Disp: , Rfl:  .  predniSONE  (STERAPRED UNI-PAK 21 TAB) 10 MG (21) TBPK tablet, Take 6 pills on day 1, then 5 pills on day 2, and so on until complete., Disp: 21 tablet, Rfl: 0  Review of Systems  Constitutional: Positive for fatigue and fever (Pt had a temp of 101 over the weekend but this has improved. ). Negative for activity change, appetite change, chills, diaphoresis and unexpected weight change.  HENT: Positive for congestion, postnasal drip, sinus pressure, sinus pain and sore throat. Negative for ear discharge, ear pain, hearing loss, rhinorrhea, tinnitus, trouble swallowing and voice change.   Eyes: Positive for discharge. Negative for photophobia, pain, redness, itching and visual disturbance.  Respiratory: Positive for cough and shortness of breath. Negative for apnea, choking, chest tightness, wheezing and stridor.   Gastrointestinal: Negative.   Neurological: Positive for light-headedness and headaches. Negative for dizziness.    Social History   Tobacco Use  . Smoking status: Never Smoker  . Smokeless tobacco: Never Used  Substance Use Topics  . Alcohol use: No    Comment: rare   Objective:   BP 134/86 (BP Location: Right Arm, Patient Position: Sitting, Cuff Size: Large)   Pulse 86   Temp 99.2 F (37.3 C) (Oral)   Resp 16   Wt 189 lb (85.7 kg)   SpO2 97%   BMI 31.94 kg/m  Vitals:   02/18/18 1103  BP: 134/86  Pulse: 86  Resp: 16  Temp: 99.2 F (37.3 C)  TempSrc: Oral  SpO2: 97%  Weight: 189 lb (85.7 kg)     Physical Exam  Constitutional: She is oriented to person, place, and time. She appears well-developed and well-nourished.  Neck: Neck supple.  Cardiovascular: Normal rate and regular rhythm.  Pulmonary/Chest: Effort normal. She has wheezes. She has no rales.  Lymphadenopathy:    She has no cervical adenopathy.  Neurological: She is alert and oriented to person, place, and time.  Skin: Skin is warm and dry.  Psychiatric: She has a normal mood and affect. Her behavior is normal.         Assessment & Plan:     1. Bronchitis  Symptoms consistent with bronchitis. Counseled on viral nature. Patient is afebrile with 97% oxygen sats, nos respiratory distress in office or other clinical signs of pneumonia. Offered inhaler and steroids, patient declines inhaler because she does not tolerate them well.  - predniSONE (STERAPRED UNI-PAK 21 TAB) 10 MG (21) TBPK tablet; Take 6 pills on day 1, then 5 pills on day 2, and so on until complete.  Dispense: 21 tablet; Refill: 0  Return if symptoms worsen or fail to improve.  The entirety of the information documented in the History of Present Illness, Review of Systems and Physical Exam were personally obtained by me. Portions of this information were initially documented by Ashley Royalty, CMA and reviewed by me for thoroughness and accuracy.         Trinna Post, PA-C  [facility]

## 2018-02-25 ENCOUNTER — Ambulatory Visit: Payer: Managed Care, Other (non HMO) | Admitting: Physician Assistant

## 2018-02-25 ENCOUNTER — Encounter: Payer: Self-pay | Admitting: Physician Assistant

## 2018-02-25 VITALS — BP 102/64 | HR 80 | Temp 98.2°F | Resp 16 | Wt 185.0 lb

## 2018-02-25 DIAGNOSIS — R251 Tremor, unspecified: Secondary | ICD-10-CM | POA: Diagnosis not present

## 2018-02-25 DIAGNOSIS — T380X5A Adverse effect of glucocorticoids and synthetic analogues, initial encounter: Secondary | ICD-10-CM | POA: Diagnosis not present

## 2018-02-25 NOTE — Progress Notes (Addendum)
Patient: [name] [name] Female    DOB: [birth year]   62 y.o.   MRN: [id number] Visit Date: 02/25/2018  Today's Provider: Trinna Post, PA-C   Chief Complaint  Patient presents with  . Shaking   Subjective:    HPI  [name] [name] is a 62 y/o woman with history of bronchitis recently on prednisone taper, history of HTN on Hyzaar, recent social stressors presenting today for feeling shaky. She was recently given  a 6 day prednisone taper for bronchitis. She has improvement in her breathing and wheezing, no fevers and chills. She says there is a shakiness on the inside. She notices some palpitations that occur randomly and reside with deep breathing. She describes a chest tightness that is not associated with exertion, diaphoresis, radiation. Her chest pain does not increase when she breathes. She has not fainted or felt dizzy. She is still reporting some coughing. She has not been eating or drinking very well. She recently has been contributing to the care of her grandchild who has been discharged from the ICU.     Allergies  Allergen Reactions  . Oxycodone-Acetaminophen Other (See Comments)     Current Outpatient Medications:  .  calcium-vitamin D (OSCAL WITH D) 500-200 MG-UNIT tablet, Take 1 tablet by mouth., Disp: , Rfl:  .  chlorpheniramine-HYDROcodone (TUSSIONEX PENNKINETIC ER) 10-8 MG/5ML SUER, Take 5 mLs by mouth 3 (three) times daily as needed for cough., Disp: 140 mL, Rfl: 0 .  losartan-hydrochlorothiazide (HYZAAR) 50-12.5 MG tablet, Take 1 tablet by mouth daily., Disp: 90 tablet, Rfl: 1 .  Multiple Vitamin (MULTIVITAMIN) capsule, Take 1 capsule by mouth daily., Disp: , Rfl:  .  niacin 500 MG tablet, Take 500 mg by mouth 2 (two) times daily with a meal. , Disp: , Rfl:  .  predniSONE (STERAPRED UNI-PAK 21 TAB) 10 MG (21) TBPK tablet, Take 6 pills on day 1, then 5 pills on day 2, and so on until complete. (Patient not taking: Reported on 02/25/2018), Disp: 21  tablet, Rfl: 0  Review of Systems  Constitutional: Positive for diaphoresis. Negative for activity change, appetite change, chills, fatigue, fever and unexpected weight change.  HENT: Positive for sinus pressure and sinus pain. Negative for ear discharge, ear pain, hearing loss, sore throat and tinnitus.   Eyes: Negative.   Respiratory: Positive for cough. Negative for apnea, choking, chest tightness, shortness of breath, wheezing and stridor.   Gastrointestinal: Negative.   Neurological: Positive for light-headedness (Feels unsteady) and headaches. Negative for dizziness, tremors, seizures, syncope, facial asymmetry, speech difficulty, weakness and numbness.    Social History   Tobacco Use  . Smoking status: Never Smoker  . Smokeless tobacco: Never Used  Substance Use Topics  . Alcohol use: No    Comment: rare   Objective:   BP 102/64 (BP Location: Right Arm, Patient Position: Sitting, Cuff Size: Normal)   Pulse 80   Temp 98.2 F (36.8 C) (Oral)   Resp 16   Wt 185 lb (83.9 kg)   SpO2 98%   BMI 31.26 kg/m  Vitals:   02/25/18 1209  BP: 102/64  Pulse: 80  Resp: 16  Temp: 98.2 F (36.8 C)  TempSrc: Oral  SpO2: 98%  Weight: 185 lb (83.9 kg)     Physical Exam  Constitutional: She is oriented to person, place, and time. She appears well-developed and well-nourished.  Cardiovascular: Normal rate and regular rhythm.  Pulmonary/Chest: Effort normal and breath sounds normal. No  respiratory distress. She has no wheezes. She has no rales. She exhibits no tenderness.  Neurological: She is alert and oriented to person, place, and time. She displays no tremor.  Skin: Skin is warm and dry.  Psychiatric: She has a normal mood and affect. Her behavior is normal.        Assessment & Plan:     1. Shakiness  DDX: palpitations, dehydration, pulmonary embolism, prednisone adverse effects.The patient is afebrile and well appearing on exam today with improvement of respiratory  symptoms in comparison to last exam. Her vitals are normal. She appears in no distress. There are no objective signs of tremors. Her hear rate and rhythm are regular. Her BP is on the low end of normal. Advised her that I do think these are side effects of the recent steroid course superimposed upon some home life stressors. Her feelings may be exacerbated by a low-normal BP and her diuretic. She may hold BP pill for one day and push fluids. Will engage in watchful waiting and patient is agreeable to this.   2. Adverse effect of prednisone, initial encounter  Return if symptoms worsen or fail to improve.  The entirety of the information documented in the History of Present Illness, Review of Systems and Physical Exam were personally obtained by me. Portions of this information were initially documented by Ashley Royalty, CMA and reviewed by me for thoroughness and accuracy.          Trinna Post, PA-C  [facility]

## 2018-02-25 NOTE — Patient Instructions (Signed)
Prednisone delayed-release tablets What is this medicine? PREDNISONE (PRED ni sone) is a corticosteroid. It is commonly used to treat inflammation of the skin, joints, lungs, and other organs. Common conditions treated include asthma, allergies, and arthritis. It is also used for other conditions, such as blood disorders and diseases of the adrenal glands. This medicine may be used for other purposes; ask your health care provider or pharmacist if you have questions. COMMON BRAND NAME(S): RAYOS What should I tell my health care provider before I take this medicine? They need to know if you have any of these conditions: -Cushing's syndrome -diabetes -glaucoma -heart disease -high blood pressure -infection (especially a virus infection such as chickenpox, cold sores, or herpes) -kidney disease -liver disease -mental illness -myasthenia gravis -osteoporosis -seizures -stomach or intestine problems -thyroid disease -an unusual or allergic reaction to lactose, prednisone, other medicines, foods, dyes, or preservatives -pregnant or trying to get pregnant -breast-feeding How should I use this medicine? Take this medicine by mouth with a glass of water. Follow the directions on the prescription label. Take this medicine with food. Do not cut, crush or chew this medicine. Do not suddenly stop taking your medicine because you may develop a severe reaction. If your doctor wants you to stop the medicine, the dose may be slowly lowered over time to avoid any side effects. Talk to your pediatrician regarding the use of this medicine in children. Special care may be needed. Overdosage: If you think you have taken too much of this medicine contact a poison control center or emergency room at once. NOTE: This medicine is only for you. Do not share this medicine with others. What if I miss a dose? If you miss a dose, take it as soon as you can. If it is almost time for your next dose, talk to your doctor  or health care professional. You may need to miss a dose or take an extra dose. Do not take double or extra doses without advice. What may interact with this medicine? Do not take this medicine with any of the following medications: -metyrapone -mifepristone This medicine may also interact with the following medications: -aminoglutethimide -amphotericin B -aspirin and aspirin-like medicines -barbiturates -certain medicines for diabetes, like glipizide or glyburide -cholestyramine -cholinesterase inhibitors -cyclosporine -digoxin -diuretics -ephedrine -female hormones, like estrogens and birth control pills -isoniazid -ketoconazole -NSAIDS, medicines for pain and inflammation, like ibuprofen or naproxen -phenytoin -rifampin -toxoids -vaccines -warfarin This list may not describe all possible interactions. Give your health care provider a list of all the medicines, herbs, non-prescription drugs, or dietary supplements you use. Also tell them if you smoke, drink alcohol, or use illegal drugs. Some items may interact with your medicine. What should I watch for while using this medicine? Visit your doctor or health care professional for regular checks on your progress. If you are taking this medicine over a prolonged period, carry an identification card with your name and address, the type and dose of your medicine, and your doctor's name and address. This medicine may increase your risk of getting an infection. Tell your doctor or health care professional if you are around anyone with measles or chickenpox, or if you develop sores or blisters that do not heal properly. If you are going to have surgery, tell your doctor or health care professional that you have taken this medicine within the last twelve months. Ask your doctor or health care professional about your diet. You may need to lower the amount of salt you  eat. This medicine may affect blood sugar levels. If you have diabetes,  check with your doctor or health care professional before you change your diet or the dose of your diabetic medicine. What side effects may I notice from receiving this medicine? Side effects that you should report to your doctor or health care professional as soon as possible: -allergic reactions like skin rash, itching or hives, swelling of the face, lips, or tongue -changes in emotions or moods -changes in vision -depressed mood -eye pain -fever or chills, cough, sore throat, pain or difficulty passing urine -increased thirst -swelling of ankles, feet Side effects that usually do not require medical attention (report to your doctor or health care professional if they continue or are bothersome): -confusion, excitement, restlessness -headache -nausea, vomiting -skin problems, acne, thin and shiny skin -trouble sleeping -weight gain This list may not describe all possible side effects. Call your doctor for medical advice about side effects. You may report side effects to FDA at 1-800-FDA-1088. Where should I keep my medicine? Keep out of the reach of children. Store at room temperature between 15 and 30 degrees C (59 and 86 degrees F). Protect from light and moisture. Keep container tightly closed. Throw away any unused medicine after the expiration date. NOTE: This sheet is a summary. It may not cover all possible information. If you have questions about this medicine, talk to your doctor, pharmacist, or health care provider.  2018 Elsevier/Gold Standard (2016-01-12 13:41:35)

## 2018-03-19 ENCOUNTER — Encounter: Payer: Self-pay | Admitting: Physician Assistant

## 2018-03-19 ENCOUNTER — Other Ambulatory Visit: Payer: Self-pay | Admitting: Physician Assistant

## 2018-03-19 DIAGNOSIS — I10 Essential (primary) hypertension: Secondary | ICD-10-CM

## 2018-03-19 MED ORDER — LOSARTAN POTASSIUM-HCTZ 50-12.5 MG PO TABS: 1.0000 | ORAL_TABLET | Freq: Every day | ORAL | 0 refills | Status: DC

## 2018-03-19 MED ORDER — LOSARTAN POTASSIUM-HCTZ 50-12.5 MG PO TABS: 1.0000 | ORAL_TABLET | Freq: Every day | ORAL | 1 refills | Status: DC

## 2018-03-25 ENCOUNTER — Telehealth: Payer: Self-pay | Admitting: Physician Assistant

## 2018-03-25 NOTE — Telephone Encounter (Signed)
Pt called saying she called 3/27 for a refill on her refill on her heart medication  Losartin / actz  She hasn't heard anything back  She needs to get one months rx through the local pharmacy and then a rx sent to her mail order [place].  Her local pharmacy is [facility]   Her call back is [phone]  Thanks teri

## 2018-03-25 NOTE — Telephone Encounter (Signed)
Pt called back to let you know pharmacy has filled her Losartin / actz

## 2018-08-01 ENCOUNTER — Ambulatory Visit: Payer: Managed Care, Other (non HMO) | Admitting: Physician Assistant

## 2018-08-01 ENCOUNTER — Encounter: Payer: Self-pay | Admitting: Physician Assistant

## 2018-08-01 VITALS — BP 122/76 | HR 68 | Temp 98.5°F

## 2018-08-01 DIAGNOSIS — R300 Dysuria: Secondary | ICD-10-CM | POA: Diagnosis not present

## 2018-08-01 DIAGNOSIS — R197 Diarrhea, unspecified: Secondary | ICD-10-CM | POA: Diagnosis not present

## 2018-08-01 DIAGNOSIS — R102 Pelvic and perineal pain: Secondary | ICD-10-CM | POA: Diagnosis not present

## 2018-08-01 LAB — POCT URINALYSIS DIPSTICK
Bilirubin, UA: NEGATIVE
Glucose, UA: NEGATIVE
Ketones, UA: NEGATIVE
Leukocytes, UA: NEGATIVE
Nitrite, UA: NEGATIVE
Protein, UA: POSITIVE — AB
Spec Grav, UA: 1.025 (ref 1.010–1.025)
Urobilinogen, UA: 0.2 E.U./dL
pH, UA: 6 (ref 5.0–8.0)

## 2018-08-01 MED ORDER — DICYCLOMINE HCL 10 MG PO CAPS: 10.0000 mg | ORAL_CAPSULE | Freq: Three times a day (TID) | ORAL | 0 refills | Status: DC

## 2018-08-01 NOTE — Progress Notes (Signed)
[place]  Chief Complaint  Patient presents with  . Urinary Tract Infection    Started a few weeks ago.    Subjective:    Patient ID: [name] [name], female    DOB: [birth year], 62 y.o.   MRN: [id number]   Urinary Tract Infection: PMH significant for IBS with Diarrhea. Patient complains of frequency and hesitancy She has had symptoms for several weeks. Patient also complains of stomach ache. Patient denies back pain and vaginal discharge. Patient does have a history of recurrent UTI.  Patient does not have a history of pyelonephritis or other renal issues. Patient denies vaginal discharge and denies new sexual partners. The patient denies recent travel outside of the [place]. She does have diarrhea at baseline, previously on Bentyl.  Currently going through stressful time taking care of special needs grandchild. Has recently returned to psychiatrist Dr. Nicolasa Ducking and is on zoloft which she finds helpful to her.   Review of Systems  Constitutional: Positive for malaise/fatigue. Negative for chills, diaphoresis, fever and weight loss.  Gastrointestinal: Positive for diarrhea (Occasionally). Negative for abdominal pain, blood in stool, constipation, heartburn, melena, nausea and vomiting.  Genitourinary: Positive for dysuria, frequency and urgency. Negative for flank pain and hematuria.  Neurological: Negative for dizziness and headaches.       Objective:   There were no vitals taken for this visit.  Patient Active Problem List   Diagnosis Date Noted  . Hypertension 02/19/2017  . Hypertriglyceridemia 06/25/2016  . Osteopenia 06/25/2016  . Buzzing in ear 06/25/2016  . Avitaminosis D 06/25/2016  . Cannot sleep 03/28/2007    Outpatient Encounter Medications as of 08/01/2018  Medication Sig  . calcium-vitamin D (OSCAL WITH D) 500-200 MG-UNIT tablet Take 1 tablet by mouth.  . losartan-hydrochlorothiazide (HYZAAR) 50-12.5 MG tablet Take  1 tablet by mouth daily.  . Multiple Vitamin (MULTIVITAMIN) capsule Take 1 capsule by mouth daily.  . niacin 500 MG tablet Take 500 mg by mouth 2 (two) times daily with a meal.   . chlorpheniramine-HYDROcodone (TUSSIONEX PENNKINETIC ER) 10-8 MG/5ML SUER Take 5 mLs by mouth 3 (three) times daily as needed for cough. (Patient not taking: Reported on 08/01/2018)  . predniSONE (STERAPRED UNI-PAK 21 TAB) 10 MG (21) TBPK tablet Take 6 pills on day 1, then 5 pills on day 2, and so on until complete. (Patient not taking: Reported on 02/25/2018)   No facility-administered encounter medications on file as of 08/01/2018.     Allergies  Allergen Reactions  . Oxycodone-Acetaminophen Other (See Comments)       Physical Exam  Constitutional: She is oriented to person, place, and time. She appears well-developed and well-nourished.  Cardiovascular: Normal rate and regular rhythm.  Pulmonary/Chest: Effort normal and breath sounds normal.  Abdominal: Soft. Bowel sounds are normal. She exhibits no distension. There is no tenderness.  Neurological: She is alert and oriented to person, place, and time.  Skin: Skin is warm and dry.       Assessment & Plan:  1. Dysuria  Some protein and small blood, no leukocytes or nitrites. Will defer treatment and send for culture.   - POCT Urinalysis Dipstick - CULTURE, URINE COMPREHENSIVE  2. Diarrhea, unspecified type  Baseline for her.  3. Pelvic cramping  Trial as below, she has been on this medication before and found it helpful for her.   - dicyclomine (BENTYL) 10 MG capsule; Take 1 capsule (10 mg total) by mouth 3 (three) times daily before  meals.  Dispense: 90 capsule; Refill: 0  Return in about 2 months (around 10/01/2018) for CPE.  The entirety of the information documented in the History of Present Illness, Review of Systems and Physical Exam were personally obtained by me. Portions of this information were initially documented by Ashley Royalty, CMA and  reviewed by me for thoroughness and accuracy.

## 2018-08-01 NOTE — Patient Instructions (Signed)

## 2018-08-05 LAB — CULTURE, URINE COMPREHENSIVE

## 2018-08-08 ENCOUNTER — Telehealth: Payer: Self-pay

## 2018-08-08 NOTE — Telephone Encounter (Signed)
Tried calling; pt's mailbox is full.   Thanks,   -Laura  

## 2018-08-08 NOTE — Telephone Encounter (Signed)
-----   Message from Trinna Post, [place] sent at 08/05/2018  4:48 PM EDT ----- No growth on urine culture.

## 2018-08-11 NOTE — Telephone Encounter (Signed)
Tried calling; pt's mailbox is full.   Thanks,   -Laura  

## 2018-08-11 NOTE — Telephone Encounter (Signed)
Pt advised.   Thanks,   -Loris Seelye  

## 2018-08-15 ENCOUNTER — Other Ambulatory Visit: Payer: Self-pay

## 2018-08-15 ENCOUNTER — Telehealth: Payer: Self-pay | Admitting: Physician Assistant

## 2018-08-15 ENCOUNTER — Encounter: Payer: Self-pay | Admitting: Emergency Medicine

## 2018-08-15 ENCOUNTER — Emergency Department
Admission: EM | Admit: 2018-08-15 | Discharge: 2018-08-15 | Disposition: A | Discharge status: Home / Self Care | Payer: Managed Care, Other (non HMO) | Attending: Emergency Medicine | Admitting: Emergency Medicine

## 2018-08-15 ENCOUNTER — Emergency Department: Payer: Managed Care, Other (non HMO)

## 2018-08-15 DIAGNOSIS — R110 Nausea: Secondary | ICD-10-CM | POA: Insufficient documentation

## 2018-08-15 DIAGNOSIS — R109 Unspecified abdominal pain: Secondary | ICD-10-CM | POA: Diagnosis present

## 2018-08-15 DIAGNOSIS — I10 Essential (primary) hypertension: Secondary | ICD-10-CM | POA: Diagnosis not present

## 2018-08-15 DIAGNOSIS — N200 Calculus of kidney: Secondary | ICD-10-CM

## 2018-08-15 DIAGNOSIS — Z79899 Other long term (current) drug therapy: Secondary | ICD-10-CM | POA: Insufficient documentation

## 2018-08-15 LAB — CBC
HCT: 37.8 % (ref 35.0–47.0)
Hemoglobin: 12.9 g/dL (ref 12.0–16.0)
MCH: 29.4 pg (ref 26.0–34.0)
MCHC: 34.1 g/dL (ref 32.0–36.0)
MCV: 86.2 fL (ref 80.0–100.0)
Platelets: 319 10*3/uL (ref 150–440)
RBC: 4.38 MIL/uL (ref 3.80–5.20)
RDW: 14.5 % (ref 11.5–14.5)
WBC: 6.7 10*3/uL (ref 3.6–11.0)

## 2018-08-15 LAB — BASIC METABOLIC PANEL
Anion gap: 7 (ref 5–15)
BUN: 16 mg/dL (ref 8–23)
CO2: 28 mmol/L (ref 22–32)
Calcium: 9.1 mg/dL (ref 8.9–10.3)
Chloride: 107 mmol/L (ref 98–111)
Creatinine, Ser: 1.13 mg/dL — ABNORMAL HIGH (ref 0.44–1.00)
GFR calc Af Amer: 59 mL/min — ABNORMAL LOW (ref >60)
GFR calc non Af Amer: 51 mL/min — ABNORMAL LOW (ref >60)
Glucose, Bld: 112 mg/dL — ABNORMAL HIGH (ref 70–99)
Potassium: 3.6 mmol/L (ref 3.5–5.1)
Sodium: 142 mmol/L (ref 135–145)

## 2018-08-15 LAB — URINALYSIS, COMPLETE (UACMP) WITH MICROSCOPIC
Bacteria, UA: NONE SEEN
Bilirubin Urine: NEGATIVE
Glucose, UA: NEGATIVE mg/dL
Ketones, ur: NEGATIVE mg/dL
Leukocytes, UA: NEGATIVE
Nitrite: NEGATIVE
Protein, ur: NEGATIVE mg/dL
RBC / HPF: >50 RBC/hpf — ABNORMAL HIGH (ref 0–5)
Specific Gravity, Urine: 1.018 (ref 1.005–1.030)
WBC, UA: NONE SEEN WBC/hpf (ref 0–5)
pH: 6 (ref 5.0–8.0)

## 2018-08-15 MED ORDER — IBUPROFEN 600 MG PO TABS: 600.0000 mg | ORAL_TABLET | Freq: Three times a day (TID) | ORAL | 0 refills | Status: DC | PRN

## 2018-08-15 MED ORDER — HYDROCODONE-ACETAMINOPHEN 5-325 MG PO TABS: 1.0000 | ORAL_TABLET | Freq: Four times a day (QID) | ORAL | 0 refills | Status: DC | PRN

## 2018-08-15 MED ORDER — TAMSULOSIN HCL 0.4 MG PO CAPS: 0.4000 mg | ORAL_CAPSULE | Freq: Every day | ORAL | 0 refills | Status: DC

## 2018-08-15 NOTE — Discharge Instructions (Signed)
It was a pleasure to take care of you today, and thank you for coming to our emergency department.  If you have any questions or concerns before leaving please ask the nurse to grab me and I'm more than happy to go through your aftercare instructions again.  If you were prescribed any opioid pain medication today such as Norco, Vicodin, Percocet, morphine, hydrocodone, or oxycodone please make sure you do not drive when you are taking this medication as it can alter your ability to drive safely.  If you have any concerns once you are home that you are not improving or are in fact getting worse before you can make it to your follow-up appointment, please do not hesitate to call 911 and come back for further evaluation.  Darel Hong, MD  Results for orders placed or performed during the hospital encounter of 08/15/18  Urinalysis, Complete w Microscopic  Result Value Ref Range   Color, Urine YELLOW (A) YELLOW   APPearance CLEAR (A) CLEAR   Specific Gravity, Urine 1.018 1.005 - 1.030   pH 6.0 5.0 - 8.0   Glucose, UA NEGATIVE NEGATIVE mg/dL   Hgb urine dipstick LARGE (A) NEGATIVE   Bilirubin Urine NEGATIVE NEGATIVE   Ketones, ur NEGATIVE NEGATIVE mg/dL   Protein, ur NEGATIVE NEGATIVE mg/dL   Nitrite NEGATIVE NEGATIVE   Leukocytes, UA NEGATIVE NEGATIVE   RBC / HPF >50 (H) 0 - 5 RBC/hpf   WBC, UA NONE SEEN 0 - 5 WBC/hpf   Bacteria, UA NONE SEEN NONE SEEN   Squamous Epithelial / LPF 0-5 0 - 5   Mucus PRESENT   CBC  Result Value Ref Range   WBC 6.7 3.6 - 11.0 K/uL   RBC 4.38 3.80 - 5.20 MIL/uL   Hemoglobin 12.9 12.0 - 16.0 g/dL   HCT 37.8 35.0 - 47.0 %   MCV 86.2 80.0 - 100.0 fL   MCH 29.4 26.0 - 34.0 pg   MCHC 34.1 32.0 - 36.0 g/dL   RDW 14.5 11.5 - 14.5 %   Platelets 319 150 - 440 K/uL  Basic metabolic panel  Result Value Ref Range   Sodium 142 135 - 145 mmol/L   Potassium 3.6 3.5 - 5.1 mmol/L   Chloride 107 98 - 111 mmol/L   CO2 28 22 - 32 mmol/L   Glucose, Bld 112 (H) 70 -  99 mg/dL   BUN 16 8 - 23 mg/dL   Creatinine, Ser 1.13 (H) 0.44 - 1.00 mg/dL   Calcium 9.1 8.9 - 10.3 mg/dL   GFR calc non Af Amer 51 (L) >60 mL/min   GFR calc Af Amer 59 (L) >60 mL/min   Anion gap 7 5 - 15   Ct Renal Stone Study  Result Date: 08/15/2018 CLINICAL DATA:  Left flank and groin pain. EXAM: CT ABDOMEN AND PELVIS WITHOUT CONTRAST TECHNIQUE: Multidetector CT imaging of the abdomen and pelvis was performed following the standard protocol without IV contrast. COMPARISON:  None. FINDINGS: Lower chest: No acute abnormality. Hepatobiliary: No focal liver abnormality is seen. Status post cholecystectomy. No biliary dilatation. Pancreas: Unremarkable. No pancreatic ductal dilatation or surrounding inflammatory changes. Spleen: Normal in size without focal abnormality. Adrenals/Urinary Tract: Normal bilateral adrenal glands. Left interpolar and lower pole renal calculi measuring up to 4 mm. Parapelvic renal cysts are suggested bilaterally, greater on the left. Additional 4 x 2 mm distal left ureteral stone is identified without significant hydroureteronephrosis. This is approximately 1 cm from the left ureterovesical junction. The urinary  bladder is unremarkable for degree of distention. Stomach/Bowel: Stomach is within normal limits. Appendix appears normal. No evidence of bowel wall thickening, distention, or inflammatory changes. Vascular/Lymphatic: Nonaneurysmal minimally atherosclerotic abdominal aorta. No lymphadenopathy. Reproductive: Uterus and bilateral adnexa are unremarkable. Other: No abdominal wall hernia or abnormality. No abdominopelvic ascites. Surgical clips project over the left inguinal region. Musculoskeletal: There is degenerative disc disease of the included thoracic spine from inferior endplate of T7 through Z02. Moderate disc flattening is also seen at L5-S1. No acute nor suspicious osseous lesions. No aggressive osseous abnormality. IMPRESSION: 1. 4 x 2 mm distal left ureteral  stone approximately 1 cm from the left UVJ without hydroureteronephrosis. Additional calculi are seen in the interpolar and lower pole of the left kidney. 2. Probable small parapelvic renal cysts, left greater than right. 3. Lower thoracic degenerative disc disease. L5-S1 degenerative disc disease. Electronically Signed   By: Ashley Royalty M.D.   On: 08/15/2018 17:54

## 2018-08-15 NOTE — ED Triage Notes (Signed)
C/O left flank and left groin pain.  Groin pain has been ongoing for the past week.  Flank pain started today.  Symptoms accompanied by nausea.

## 2018-08-15 NOTE — Telephone Encounter (Signed)
Patient reports flank pain is worsening and is now at the ER.

## 2018-08-15 NOTE — ED Provider Notes (Signed)
[facility] Emergency Department Provider Note  ____________________________________________   First MD Initiated Contact with Patient 08/15/18 1719     (approximate)  I have reviewed the triage vital signs and the nursing notes.   HISTORY  Chief Complaint Flank Pain   HPI [name] [name] is a 62 y.o. female who reports more than 1 week of left flank pain radiating to her left groin.  The pain is moderate in severity associated with nausea.  Nothing seems to make it better or worse.  She has a remote history of cholecystectomy but no other abdominal surgeries.  No fevers or chills.  No chest pain or shortness of breath.  No dysuria frequency or hesitancy.  Normal bowel movements and flatus.    Past Medical History:  Diagnosis Date  . Anxiety   . Renal disorder    kidney stones    Patient Active Problem List   Diagnosis Date Noted  . Hypertension 02/19/2017  . Hypertriglyceridemia 06/25/2016  . Osteopenia 06/25/2016  . Buzzing in ear 06/25/2016  . Avitaminosis D 06/25/2016  . Cannot sleep 03/28/2007    Past Surgical History:  Procedure Laterality Date  . ANKLE FRACTURE SURGERY    . CHOLECYSTECTOMY      Prior to Admission medications   Medication Sig Start Date End Date Taking? Authorizing Provider  calcium-vitamin D (OSCAL WITH D) 500-200 MG-UNIT tablet Take 1 tablet by mouth.    [provider]  chlorpheniramine-HYDROcodone (TUSSIONEX PENNKINETIC ER) 10-8 MG/5ML SUER Take 5 mLs by mouth 3 (three) times daily as needed for cough. Patient not taking: Reported on 08/01/2018 02/15/18   Virginia Crews, MD  dicyclomine (BENTYL) 10 MG capsule Take 1 capsule (10 mg total) by mouth 3 (three) times daily before meals. 08/01/18 08/31/18  Trinna Post, PA-C  HYDROcodone-acetaminophen (NORCO) 5-325 MG tablet Take 1 tablet by mouth every 6 (six) hours as needed for up to 15 doses for severe pain. 08/15/18   Darel Hong, MD  ibuprofen  (ADVIL,MOTRIN) 600 MG tablet Take 1 tablet (600 mg total) by mouth every 8 (eight) hours as needed. 08/15/18   Darel Hong, MD  losartan-hydrochlorothiazide (HYZAAR) 50-12.5 MG tablet Take 1 tablet by mouth daily. 03/19/18 09/15/18  Trinna Post, PA-C  Multiple Vitamin (MULTIVITAMIN) capsule Take 1 capsule by mouth daily.    [provider]  niacin 500 MG tablet Take 500 mg by mouth 2 (two) times daily with a meal.     [provider]  predniSONE (STERAPRED UNI-PAK 21 TAB) 10 MG (21) TBPK tablet Take 6 pills on day 1, then 5 pills on day 2, and so on until complete. Patient not taking: Reported on 02/25/2018 02/18/18   Trinna Post, PA-C  sertraline (ZOLOFT) 50 MG tablet Take 50 mg by mouth daily.    [provider]  tamsulosin (FLOMAX) 0.4 MG CAPS capsule Take 1 capsule (0.4 mg total) by mouth daily. 08/15/18   Darel Hong, MD  traZODone (DESYREL) 100 MG tablet Take 100 mg by mouth at bedtime.    [provider]    Allergies Oxycodone-acetaminophen  Family History  Problem Relation Age of Onset  . Breast cancer Maternal Aunt 60    Social History Social History   Tobacco Use  . Smoking status: Never Smoker  . Smokeless tobacco: Never Used  Substance Use Topics  . Alcohol use: No    Comment: rare  . Drug use: No    Review of Systems Constitutional:  No fever/chills Eyes: No visual changes. ENT: No sore throat. Cardiovascular: Denies chest pain. Respiratory: Denies shortness of breath. Gastrointestinal: Positive for abdominal pain.  Positive for nausea, no vomiting.  No diarrhea.  No constipation. Genitourinary: Negative for dysuria. Musculoskeletal: Positive for back pain. Skin: Negative for rash. Neurological: Negative for headaches, focal weakness or numbness.   ____________________________________________   PHYSICAL EXAM:  VITAL SIGNS: ED Triage Vitals  Enc Vitals Group     BP 08/15/18 1353 108/60     Pulse Rate  08/15/18 1353 74     Resp 08/15/18 1353 20     Temp 08/15/18 1353 98.1 F (36.7 C)     Temp Source 08/15/18 1353 Oral     SpO2 08/15/18 1353 94 %     Weight 08/15/18 1357 195 lb (88.5 kg)     Height 08/15/18 1357 5' 4.5" (1.638 m)     Head Circumference --      Peak Flow --      Pain Score 08/15/18 1357 0     Pain Loc --      Pain Edu? --      Excl. in [place]? --     Constitutional: Alert and oriented x4 obviously uncomfortable nontoxic no diaphoresis Eyes: PERRL EOMI. Head: Atraumatic. Nose: No congestion/rhinnorhea. Mouth/Throat: No trismus Neck: No stridor.   Cardiovascular: Normal rate, regular rhythm. Grossly normal heart sounds.  Good peripheral circulation. Respiratory: Normal respiratory effort.  No retractions. Lungs CTAB and moving good air Gastrointestinal: Obese soft nondistended nontender no rebound or guarding no peritonitis no costovertebral tenderness Musculoskeletal: No lower extremity edema   Neurologic:  Normal speech and language. No gross focal neurologic deficits are appreciated. Skin:  Skin is warm, dry and intact. No rash noted. Psychiatric: Mood and affect are normal. Speech and behavior are normal.    ____________________________________________   DIFFERENTIAL includes but not limited to  Nephrolithiasis, pyelonephritis, appendicitis, diverticulitis, bowel obstruction ____________________________________________   LABS (all labs ordered are listed, but only abnormal results are displayed)  Labs Reviewed  URINALYSIS, COMPLETE (UACMP) WITH MICROSCOPIC - Abnormal; Notable for the following components:      Result Value   Color, Urine YELLOW (*)    APPearance CLEAR (*)    Hgb urine dipstick LARGE (*)    RBC / HPF >50 (*)    All other components within normal limits  BASIC METABOLIC PANEL - Abnormal; Notable for the following components:   Glucose, Bld 112 (*)    Creatinine, Ser 1.13 (*)    GFR calc non Af Amer 51 (*)    GFR calc Af Amer 59 (*)      All other components within normal limits  CBC    Work reviewed by me with hematuria with no evidence of infection __________________________________________  EKG   ____________________________________________  RADIOLOGY  CT stone protocol reviewed by me consistent with distal left-sided stone near the UVJ ____________________________________________   PROCEDURES  Procedure(s) performed: no  Procedures  Critical Care performed: no  ____________________________________________   INITIAL IMPRESSION / ASSESSMENT AND PLAN / ED COURSE  Pertinent labs & imaging results that were available during my care of the patient were reviewed by me and considered in my medical decision making (see chart for details).   As part of my medical decision making, I reviewed the following data within the [place] History obtained from family if available, nursing notes, old chart and ekg, as well as notes from prior ED visits.  The patient  arrives obviously uncomfortable appearing with left flank pain radiating to her groin for more than a week now with hematuria.  I strongly recommended pain medication however she declined.  Given the uncertainty CT scan is pending.  CT scan shows a 4 mm stone near the left UVJ.  I will prescribe her Flomax, Norco, and ibuprofen to help with the pain and for medical expulsive therapy.  As the pain is been going on for more than a week already she likely would benefit from urology evaluation as an outpatient for possible lithotripsy.  Strict return precautions have been given particularly regarding fevers.      ____________________________________________   FINAL CLINICAL IMPRESSION(S) / ED DIAGNOSES  Final diagnoses:  Kidney stone      NEW MEDICATIONS STARTED DURING THIS VISIT:  Discharge Medication List as of 08/15/2018  6:46 PM    START taking these medications   Details  HYDROcodone-acetaminophen (NORCO) 5-325 MG tablet  Take 1 tablet by mouth every 6 (six) hours as needed for up to 15 doses for severe pain., Starting Fri 08/15/2018, Print    ibuprofen (ADVIL,MOTRIN) 600 MG tablet Take 1 tablet (600 mg total) by mouth every 8 (eight) hours as needed., Starting Fri 08/15/2018, Print    tamsulosin (FLOMAX) 0.4 MG CAPS capsule Take 1 capsule (0.4 mg total) by mouth daily., Starting Fri 08/15/2018, Print         Note:  This document was prepared using Dragon voice recognition software and may include unintentional dictation errors.     Darel Hong, MD 08/17/18 [phone]

## 2018-08-15 NOTE — Telephone Encounter (Signed)
Pt calling stating that she had came in office on 08-01-18 and was treated and was told by the provider that if she wasn't feeling better in a week or two to call back so she is stating she is not feeling better would like to know if she needs a appointment or is it something that the can be talked about by phone.  Please advise pt. Thanks CC

## 2018-08-19 ENCOUNTER — Ambulatory Visit
Admission: RE | Admit: 2018-08-19 | Discharge: 2018-08-19 | Disposition: A | Discharge status: Home / Self Care | Payer: Managed Care, Other (non HMO) | Source: Ambulatory Visit | Attending: Urology | Admitting: Urology

## 2018-08-19 ENCOUNTER — Ambulatory Visit: Payer: Managed Care, Other (non HMO) | Admitting: Urology

## 2018-08-19 ENCOUNTER — Other Ambulatory Visit: Payer: Self-pay | Admitting: Radiology

## 2018-08-19 ENCOUNTER — Encounter: Payer: Self-pay | Admitting: Urology

## 2018-08-19 VITALS — BP 126/75 | HR 76 | Ht 64.0 in | Wt 195.0 lb

## 2018-08-19 DIAGNOSIS — N200 Calculus of kidney: Secondary | ICD-10-CM | POA: Diagnosis not present

## 2018-08-19 DIAGNOSIS — N201 Calculus of ureter: Secondary | ICD-10-CM

## 2018-08-19 LAB — URINALYSIS, COMPLETE
Bilirubin, UA: NEGATIVE
Glucose, UA: NEGATIVE
Ketones, UA: NEGATIVE
Leukocytes, UA: NEGATIVE
Nitrite, UA: NEGATIVE
Protein, UA: NEGATIVE
Specific Gravity, UA: 1.02 (ref 1.005–1.030)
Urobilinogen, Ur: 0.2 mg/dL (ref 0.2–1.0)
pH, UA: 7 (ref 5.0–7.5)

## 2018-08-19 LAB — MICROSCOPIC EXAMINATION: RBC, UA: NONE SEEN /hpf (ref 0–2)

## 2018-08-19 NOTE — Progress Notes (Signed)
08/19/2018 1:41 PM   GENIENE LIST December 10, 1956 [id number]  Referring provider: Trinna Post, PA-C [address] [place], [place] [address]  Chief Complaint  Patient presents with  . Nephrolithiasis    New Patient    HPI: 62 year old female who presents today for follow-up of kidney stone.  She presented to the emergency room on 08/15/2018 with several weeks of flank pain.  She had previously seen her PCP on 08/01/2018 with urinary symptoms and pelvic cramping, now in retrospect was likely secondary to a kidney stone.  CT scan in the ED showed a 4 x 2 mm left distal ureteral stone approximate 1 cm from the UVJ without significant hydroureteronephrosis.  She did have additional nonobstructing kidney stones on the left measuring up to 4 mm.  Bilateral parapelvic cysts were also appreciated.  She continues to have left lower quadrant pain and her pain which she describes as burning near her labia.  She does not believe she passed her stone.  She has not been straining consistently.  No dysuria, fevers or chills.  She had lithotripsy 30 years ago which worked well for nonobstructing stone.     PMH: Past Medical History:  Diagnosis Date  . Anxiety   . Hypertension 02/19/2017  . Hypertriglyceridemia 06/25/2016  . Osteopenia 06/25/2016  . Renal disorder    kidney stones    Surgical History: Past Surgical History:  Procedure Laterality Date  . ANKLE FRACTURE SURGERY    . CHOLECYSTECTOMY      Home Medications:  Allergies as of 08/19/2018      Reactions   Oxycodone-acetaminophen Other (See Comments)      Medication List        Accurate as of 08/19/18  1:41 PM. Always use your most recent med list.          calcium-vitamin D 500-200 MG-UNIT tablet Commonly known as:  OSCAL WITH D Take 1 tablet by mouth.   dicyclomine 10 MG capsule Commonly known as:  BENTYL Take 1 capsule (10 mg total) by mouth 3 (three) times daily before meals.     HYDROcodone-acetaminophen 5-325 MG tablet Commonly known as:  NORCO/VICODIN Take 1 tablet by mouth every 6 (six) hours as needed for up to 15 doses for severe pain.   ibuprofen 600 MG tablet Commonly known as:  ADVIL,MOTRIN Take 1 tablet (600 mg total) by mouth every 8 (eight) hours as needed.   losartan-hydrochlorothiazide 50-12.5 MG tablet Commonly known as:  HYZAAR Take 1 tablet by mouth daily.   multivitamin capsule Take 1 capsule by mouth daily.   niacin 500 MG tablet Take 500 mg by mouth 2 (two) times daily with a meal.   sertraline 50 MG tablet Commonly known as:  ZOLOFT Take 50 mg by mouth daily.   tamsulosin 0.4 MG Caps capsule Commonly known as:  FLOMAX Take 1 capsule (0.4 mg total) by mouth daily.   traZODone 100 MG tablet Commonly known as:  DESYREL Take 100 mg by mouth at bedtime.       Allergies:  Allergies  Allergen Reactions  . Oxycodone-Acetaminophen Other (See Comments)    Family History: Family History  Problem Relation Age of Onset  . Breast cancer Maternal Aunt 60    Social History:  reports that she has never smoked. She has never used smokeless tobacco. She reports that she does not drink alcohol or use drugs.  ROS: UROLOGY Frequent Urination?: Yes Hard to postpone urination?: No Burning/pain with urination?: No Get  up at night to urinate?: Yes Leakage of urine?: No Urine stream starts and stops?: No Trouble starting stream?: No Do you have to strain to urinate?: No Blood in urine?: Yes Urinary tract infection?: No Sexually transmitted disease?: No Injury to kidneys or bladder?: No Painful intercourse?: No Weak stream?: No Currently pregnant?: No Vaginal bleeding?: No Last menstrual period?: n  Gastrointestinal Nausea?: Yes Vomiting?: No Indigestion/heartburn?: No Diarrhea?: Yes Constipation?: No  Constitutional Fever: No Night sweats?: No Weight loss?: No Fatigue?: No  Skin Skin rash/lesions?: Yes Itching?:  Yes  Eyes Blurred vision?: No Double vision?: No  Ears/Nose/Throat Sore throat?: No Sinus problems?: No  Hematologic/Lymphatic Swollen glands?: No Easy bruising?: No  Cardiovascular Leg swelling?: No Chest pain?: No  Respiratory Cough?: No Shortness of breath?: No  Endocrine Excessive thirst?: No  Musculoskeletal Back pain?: No Joint pain?: No  Neurological Headaches?: No Dizziness?: No  Psychologic Depression?: Yes Anxiety?: No  Physical Exam: BP 126/75   Pulse 76   Ht 5\' 4"  (1.626 m)   Wt 195 lb (88.5 kg)   BMI 33.47 kg/m   Constitutional:  Alert and oriented, No acute distress. HEENT: [place] AT, moist mucus membranes.  Trachea midline, no masses. Cardiovascular: No clubbing, cyanosis, or edema. Respiratory: Normal respiratory effort, no increased work of breathing. GI: Abdomen is soft, nontender, nondistended, no abdominal masses GU: No CVA tenderness Skin: No rashes, bruises or suspicious lesions. Neurologic: Grossly intact, no focal deficits, moving all 4 extremities. Psychiatric: Normal mood and affect.  Laboratory Data: Lab Results  Component Value Date   WBC 6.7 08/15/2018   HGB 12.9 08/15/2018   HCT 37.8 08/15/2018   MCV 86.2 08/15/2018   PLT 319 08/15/2018    Lab Results  Component Value Date   CREATININE 1.13 (H) 08/15/2018    Lab Results  Component Value Date   HGBA1C 5.9 08/20/2017    Urinalysis Results for orders placed or performed in visit on 08/19/18  Microscopic Examination  Result Value Ref Range   WBC, UA 0-5 0 - 5 /hpf   RBC, UA None seen 0 - 2 /hpf   Epithelial Cells (non renal) 0-10 0 - 10 /hpf   Mucus, UA Present (A) Not Estab.   Bacteria, UA Few (A) None seen/Few  Urinalysis, Complete  Result Value Ref Range   Specific Gravity, UA 1.020 1.005 - 1.030   pH, UA 7.0 5.0 - 7.5   Color, UA Yellow Yellow   Appearance Ur Clear Clear   Leukocytes, UA Negative Negative   Protein, UA Negative Negative/Trace    Glucose, UA Negative Negative   Ketones, UA Negative Negative   RBC, UA Trace (A) Negative   Bilirubin, UA Negative Negative   Urobilinogen, Ur 0.2 0.2 - 1.0 mg/dL   Nitrite, UA Negative Negative   Microscopic Examination See below:     Pertinent Imaging: Results for orders placed during the hospital encounter of 08/15/18  CT Renal Stone Study   Narrative CLINICAL DATA:  Left flank and groin pain.  EXAM: CT ABDOMEN AND PELVIS WITHOUT CONTRAST  TECHNIQUE: Multidetector CT imaging of the abdomen and pelvis was performed following the standard protocol without IV contrast.  COMPARISON:  None.  FINDINGS: Lower chest: No acute abnormality.  Hepatobiliary: No focal liver abnormality is seen. Status post cholecystectomy. No biliary dilatation.  Pancreas: Unremarkable. No pancreatic ductal dilatation or surrounding inflammatory changes.  Spleen: Normal in size without focal abnormality.  Adrenals/Urinary Tract: Normal bilateral adrenal glands. Left interpolar and lower pole  renal calculi measuring up to 4 mm. Parapelvic renal cysts are suggested bilaterally, greater on the left. Additional 4 x 2 mm distal left ureteral stone is identified without significant hydroureteronephrosis. This is approximately 1 cm from the left ureterovesical junction. The urinary bladder is unremarkable for degree of distention.  Stomach/Bowel: Stomach is within normal limits. Appendix appears normal. No evidence of bowel wall thickening, distention, or inflammatory changes.  Vascular/Lymphatic: Nonaneurysmal minimally atherosclerotic abdominal aorta. No lymphadenopathy.  Reproductive: Uterus and bilateral adnexa are unremarkable.  Other: No abdominal wall hernia or abnormality. No abdominopelvic ascites. Surgical clips project over the left inguinal region.  Musculoskeletal: There is degenerative disc disease of the included thoracic spine from inferior endplate of T7 through H37.  Moderate disc flattening is also seen at L5-S1. No acute nor suspicious osseous lesions. No aggressive osseous abnormality.  IMPRESSION: 1. 4 x 2 mm distal left ureteral stone approximately 1 cm from the left UVJ without hydroureteronephrosis. Additional calculi are seen in the interpolar and lower pole of the left kidney. 2. Probable small parapelvic renal cysts, left greater than right. 3. Lower thoracic degenerative disc disease. L5-S1 degenerative disc disease.   Electronically Signed   By: Aristotelis Vilardi Royalty M.D.   On: 08/15/2018 17:54    CT scan personally reviewed today with the patient.  Agree with radiologic interpretation.  Assessment & Plan:    1. Left ureteral stone 4 mm retained left ureteral stone, symptomatic without signs of infection  Based on the size and location of the stone, she does pass spontaneously thus at this point, I would more strongly recommend surgical intervention then continued medical expulsive therapy.  We discussed various treatment options including ESWL vs. ureteroscopy, laser lithotripsy, and stent. We discussed the risks and benefits of both including bleeding, infection, damage to surrounding structures, efficacy with need for possible further intervention, and need for temporary ureteral stent.  Had shockwave lithotripsy in the past and is more interested in this.  She will go get a KUB today and of the stone is visible, she like to be booked for shockwave lithotripsy on Thursday.  She understands that this may be ineffective and if the stone fails to pass shockwave, she would need further intervention.    All questions answered.    Warning symptoms for more urgent/emergent intervention were reviewed in detail.    - DG Abd 1 View; Future  2. Nephrolithiasis Nonobstructing nephrolithiasis, will discuss further once obstructing stone is been treated - Urinalysis, Complete   Schedule L ESWL  Hollice Espy, MD  [facility] [address] [place], [place] [address] [phone]

## 2018-08-20 ENCOUNTER — Telehealth: Payer: Self-pay | Admitting: Urology

## 2018-08-20 ENCOUNTER — Other Ambulatory Visit: Payer: Self-pay | Admitting: Radiology

## 2018-08-20 DIAGNOSIS — N201 Calculus of ureter: Secondary | ICD-10-CM

## 2018-08-20 MED ORDER — CIPROFLOXACIN HCL 500 MG PO TABS: 500.0000 mg | ORAL_TABLET | ORAL | Status: DC

## 2018-08-20 NOTE — Telephone Encounter (Signed)
Insurance Authorization ESWL (CPT [id number]) Diagnosis Code: N20.1 - left ureteral stone Dr. Bernardo Heater 08/21/2018  Per Christella Scheuermann [phone]), prior authorization is NOT required.   Conf # [id number] [place] 08/20/2018 8:51am

## 2018-08-21 ENCOUNTER — Encounter: Payer: Self-pay | Admitting: *Deleted

## 2018-08-21 ENCOUNTER — Ambulatory Visit
Admission: RE | Admit: 2018-08-21 | Discharge: 2018-08-21 | Disposition: A | Discharge status: Home / Self Care | Payer: Managed Care, Other (non HMO) | Source: Ambulatory Visit | Attending: Urology | Admitting: Urology

## 2018-08-21 ENCOUNTER — Ambulatory Visit: Payer: Managed Care, Other (non HMO)

## 2018-08-21 ENCOUNTER — Other Ambulatory Visit: Payer: Self-pay

## 2018-08-21 ENCOUNTER — Encounter
Admission: RE | Disposition: A | Discharge status: Home / Self Care | Payer: Self-pay | Source: Ambulatory Visit | Attending: Urology

## 2018-08-21 DIAGNOSIS — I10 Essential (primary) hypertension: Secondary | ICD-10-CM | POA: Insufficient documentation

## 2018-08-21 DIAGNOSIS — Z6833 Body mass index (BMI) 33.0-33.9, adult: Secondary | ICD-10-CM | POA: Diagnosis not present

## 2018-08-21 DIAGNOSIS — Z885 Allergy status to narcotic agent status: Secondary | ICD-10-CM | POA: Diagnosis not present

## 2018-08-21 DIAGNOSIS — N201 Calculus of ureter: Secondary | ICD-10-CM

## 2018-08-21 DIAGNOSIS — E669 Obesity, unspecified: Secondary | ICD-10-CM | POA: Diagnosis not present

## 2018-08-21 DIAGNOSIS — M8580 Other specified disorders of bone density and structure, unspecified site: Secondary | ICD-10-CM | POA: Insufficient documentation

## 2018-08-21 DIAGNOSIS — Z79899 Other long term (current) drug therapy: Secondary | ICD-10-CM | POA: Insufficient documentation

## 2018-08-21 HISTORY — PX: EXTRACORPOREAL SHOCK WAVE LITHOTRIPSY: SHX1557

## 2018-08-21 SURGERY — LITHOTRIPSY, ESWL
Anesthesia: Moderate Sedation | Laterality: Left

## 2018-08-21 MED ORDER — SODIUM CHLORIDE 0.9 % IV SOLN: INTRAVENOUS | Status: DC

## 2018-08-21 MED ORDER — CIPROFLOXACIN HCL 500 MG PO TABS
ORAL_TABLET | ORAL | Status: AC
  Administered 2018-08-21: 500 mg
  Filled 2018-08-21: qty 1

## 2018-08-21 MED ORDER — DIAZEPAM 5 MG PO TABS
ORAL_TABLET | ORAL | Status: AC
  Filled 2018-08-21: qty 2

## 2018-08-21 MED ORDER — DIPHENHYDRAMINE HCL 25 MG PO CAPS
ORAL_CAPSULE | ORAL | Status: AC
  Filled 2018-08-21: qty 1

## 2018-08-21 MED ORDER — ONDANSETRON HCL 4 MG/2ML IJ SOLN
INTRAMUSCULAR | Status: AC
  Filled 2018-08-21: qty 2

## 2018-08-21 MED ORDER — DIAZEPAM 5 MG PO TABS
10.0000 mg | ORAL_TABLET | ORAL | Status: AC
  Administered 2018-08-21: 10 mg via ORAL

## 2018-08-21 MED ORDER — ONDANSETRON HCL 4 MG/2ML IJ SOLN
4.0000 mg | Freq: Once | INTRAMUSCULAR | Status: AC | PRN
  Administered 2018-08-21: 4 mg via INTRAVENOUS

## 2018-08-21 MED ORDER — DIPHENHYDRAMINE HCL 25 MG PO CAPS
25.0000 mg | ORAL_CAPSULE | ORAL | Status: AC
  Administered 2018-08-21: 25 mg via ORAL

## 2018-08-28 ENCOUNTER — Emergency Department
Admission: EM | Admit: 2018-08-28 | Discharge: 2018-08-28 | Disposition: A | Discharge status: Home / Self Care | Payer: Managed Care, Other (non HMO) | Attending: Emergency Medicine | Admitting: Emergency Medicine

## 2018-08-28 ENCOUNTER — Emergency Department: Payer: Managed Care, Other (non HMO)

## 2018-08-28 ENCOUNTER — Other Ambulatory Visit: Payer: Self-pay

## 2018-08-28 ENCOUNTER — Telehealth: Payer: Self-pay | Admitting: Urology

## 2018-08-28 DIAGNOSIS — I10 Essential (primary) hypertension: Secondary | ICD-10-CM | POA: Insufficient documentation

## 2018-08-28 DIAGNOSIS — Z79899 Other long term (current) drug therapy: Secondary | ICD-10-CM | POA: Insufficient documentation

## 2018-08-28 DIAGNOSIS — N200 Calculus of kidney: Secondary | ICD-10-CM | POA: Insufficient documentation

## 2018-08-28 DIAGNOSIS — R1012 Left upper quadrant pain: Secondary | ICD-10-CM | POA: Diagnosis present

## 2018-08-28 LAB — BASIC METABOLIC PANEL
Anion gap: 6 (ref 5–15)
BUN: 17 mg/dL (ref 8–23)
CO2: 25 mmol/L (ref 22–32)
Calcium: 9.1 mg/dL (ref 8.9–10.3)
Chloride: 109 mmol/L (ref 98–111)
Creatinine, Ser: 0.92 mg/dL (ref 0.44–1.00)
GFR calc Af Amer: >60 mL/min (ref >60)
GFR calc non Af Amer: >60 mL/min (ref >60)
Glucose, Bld: 92 mg/dL (ref 70–99)
Potassium: 4.2 mmol/L (ref 3.5–5.1)
Sodium: 140 mmol/L (ref 135–145)

## 2018-08-28 LAB — CBC WITH DIFFERENTIAL/PLATELET
Basophils Absolute: 0 10*3/uL (ref 0–0.1)
Basophils Relative: 1 %
Eosinophils Absolute: 0.3 10*3/uL (ref 0–0.7)
Eosinophils Relative: 3 %
HCT: 37.5 % (ref 35.0–47.0)
Hemoglobin: 12.5 g/dL (ref 12.0–16.0)
Lymphocytes Relative: 16 %
Lymphs Abs: 1.6 10*3/uL (ref 1.0–3.6)
MCH: 28.7 pg (ref 26.0–34.0)
MCHC: 33.3 g/dL (ref 32.0–36.0)
MCV: 86.3 fL (ref 80.0–100.0)
Monocytes Absolute: 0.7 10*3/uL (ref 0.2–0.9)
Monocytes Relative: 8 %
Neutro Abs: 7.2 10*3/uL — ABNORMAL HIGH (ref 1.4–6.5)
Neutrophils Relative %: 72 %
Platelets: 296 10*3/uL (ref 150–440)
RBC: 4.35 MIL/uL (ref 3.80–5.20)
RDW: 14.9 % — ABNORMAL HIGH (ref 11.5–14.5)
WBC: 9.8 10*3/uL (ref 3.6–11.0)

## 2018-08-28 LAB — URINALYSIS, COMPLETE (UACMP) WITH MICROSCOPIC
Bacteria, UA: NONE SEEN
Bilirubin Urine: NEGATIVE
Glucose, UA: NEGATIVE mg/dL
Ketones, ur: NEGATIVE mg/dL
Leukocytes, UA: NEGATIVE
Nitrite: NEGATIVE
Protein, ur: NEGATIVE mg/dL
Specific Gravity, Urine: 1.011 (ref 1.005–1.030)
pH: 5 (ref 5.0–8.0)

## 2018-08-28 MED ORDER — KETOROLAC TROMETHAMINE 30 MG/ML IJ SOLN
30.0000 mg | Freq: Once | INTRAMUSCULAR | Status: AC
  Administered 2018-08-28: 30 mg via INTRAVENOUS
  Filled 2018-08-28: qty 1

## 2018-08-28 MED ORDER — SODIUM CHLORIDE 0.9 % IV BOLUS
1000.0000 mL | Freq: Once | INTRAVENOUS | Status: AC
  Administered 2018-08-28: 1000 mL via INTRAVENOUS

## 2018-08-28 MED ORDER — FENTANYL CITRATE (PF) 100 MCG/2ML IJ SOLN
50.0000 ug | INTRAMUSCULAR | Status: DC | PRN
  Administered 2018-08-28: 50 ug via INTRAVENOUS
  Filled 2018-08-28: qty 2

## 2018-08-28 NOTE — ED Triage Notes (Addendum)
Left sided flank pain after recent lithotripsy. Pt tearful. Nausea.  Took norco PTA with no relief.

## 2018-08-28 NOTE — Telephone Encounter (Signed)
Pt called office stating that she had lithotripsy done 8/29. Pt states she is still having the same pain as before but feels the pain has gotten worse. Flank pain that shoots and front left groin that radiates to her left side of her vagina, also has urinary frequency and seems to have less output than what she feels she has when she has to go. Please advise pt at [phone].

## 2018-08-28 NOTE — ED Notes (Signed)
Pt states that she has come back to ED due to persisting pain in flank. Pt was given fentanyl and states that pain has improved and is feeling much better.

## 2018-08-28 NOTE — ED Notes (Signed)
Pt reports pain has improved

## 2018-08-28 NOTE — ED Notes (Signed)
Stuck X 2 for PIV, no success.

## 2018-08-28 NOTE — ED Notes (Signed)
Unable to provide urine sample

## 2018-08-28 NOTE — ED Notes (Signed)
Computer not working in room. Pt verbalized understanding of discharge instructions and reviewed the paperwork with the patient. Pt understood future appointments and rest of paperwork.

## 2018-08-28 NOTE — ED Provider Notes (Addendum)
[facility] Emergency Department Provider Note ____________________________________________   First MD Initiated Contact with Patient 08/28/18 1922     (approximate)  I have reviewed the triage vital signs and the nursing notes.   HISTORY  Chief Complaint Flank Pain  HPI [name] [name] is a 62 y.o. female with a history of kidney stones status post recent lithotripsy who was presented to the emergency department today with left flank and left lower quadrant pain.  She says that the pain is sharp and a 3 out of 10 at this time after receiving fentanyl in triage.  Says that she had a lithotripsy this past Thursday and the pain is been controlled with ibuprofen since until today.  She had a trans-corporeal lithotripsy and a transurethral was discussed but the less invasive route was chosen.  Reports compliance with her Flomax.  Past Medical History:  Diagnosis Date  . Anxiety   . Hypertension 02/19/2017  . Hypertriglyceridemia 06/25/2016  . Kidney stone   . Osteopenia 06/25/2016  . Renal disorder    kidney stones    Patient Active Problem List   Diagnosis Date Noted  . Hypertension 02/19/2017  . Hypertriglyceridemia 06/25/2016  . Osteopenia 06/25/2016  . Buzzing in ear 06/25/2016  . Avitaminosis D 06/25/2016  . Cannot sleep 03/28/2007    Past Surgical History:  Procedure Laterality Date  . ANKLE FRACTURE SURGERY    . CHOLECYSTECTOMY    . EXTRACORPOREAL SHOCK WAVE LITHOTRIPSY Left 08/21/2018   Procedure: EXTRACORPOREAL SHOCK WAVE LITHOTRIPSY (ESWL);  Surgeon: Abbie Sons, MD;  Location: [facility] ORS;  Service: Urology;  Laterality: Left;    Prior to Admission medications   Medication Sig Start Date End Date Taking? Authorizing Provider  calcium-vitamin D (OSCAL WITH D) 500-200 MG-UNIT tablet Take 1 tablet by mouth.    [provider]  dicyclomine (BENTYL) 10 MG capsule Take 1 capsule (10 mg total) by mouth 3 (three) times daily before  meals. 08/01/18 08/31/18  Trinna Post, PA-C  HYDROcodone-acetaminophen (NORCO) 5-325 MG tablet Take 1 tablet by mouth every 6 (six) hours as needed for up to 15 doses for severe pain. 08/15/18   Darel Hong, MD  ibuprofen (ADVIL,MOTRIN) 600 MG tablet Take 1 tablet (600 mg total) by mouth every 8 (eight) hours as needed. 08/15/18   Darel Hong, MD  losartan-hydrochlorothiazide (HYZAAR) 50-12.5 MG tablet Take 1 tablet by mouth daily. 03/19/18 09/15/18  Trinna Post, PA-C  Multiple Vitamin (MULTIVITAMIN) capsule Take 1 capsule by mouth daily.    [provider]  niacin 500 MG tablet Take 500 mg by mouth 2 (two) times daily with a meal.     [provider]  sertraline (ZOLOFT) 50 MG tablet Take 50 mg by mouth daily.    [provider]  tamsulosin (FLOMAX) 0.4 MG CAPS capsule Take 1 capsule (0.4 mg total) by mouth daily. 08/15/18   Darel Hong, MD  traZODone (DESYREL) 100 MG tablet Take 100 mg by mouth at bedtime.    [provider]    Allergies Oxycodone-acetaminophen  Family History  Problem Relation Age of Onset  . Breast cancer Maternal Aunt 60    Social History Social History   Tobacco Use  . Smoking status: Never Smoker  . Smokeless tobacco: Never Used  Substance Use Topics  . Alcohol use: No    Comment: rare  . Drug use: No    Review of Systems  Constitutional: No fever/chills Eyes: No visual changes. ENT: No  sore throat. Cardiovascular: Denies chest pain. Respiratory: Denies shortness of breath. Gastrointestinal: As above no nausea, no vomiting.  No diarrhea.  No constipation. Genitourinary: Negative for dysuria. Musculoskeletal: Mild left lower back pain. Skin: Negative for rash. Neurological: Negative for headaches, focal weakness or numbness.   ____________________________________________   PHYSICAL EXAM:  VITAL SIGNS: ED Triage Vitals [08/28/18 1624]  Enc Vitals Group     BP (!) 153/86     Pulse Rate 72      Resp 18     Temp 98.1 F (36.7 C)     Temp Source Oral     SpO2 100 %     Weight 194 lb 0.1 oz (88 kg)     Height 5\' 4"  (1.626 m)     Head Circumference      Peak Flow      Pain Score 8     Pain Loc      Pain Edu?      Excl. in [place]?     Constitutional: Alert and oriented. Well appearing and in no acute distress. Eyes: Conjunctivae are normal.  Head: Atraumatic. Nose: No congestion/rhinnorhea. Mouth/Throat: Mucous membranes are moist.  Neck: No stridor.   Cardiovascular: Normal rate, regular rhythm. Grossly normal heart sounds. Respiratory: Normal respiratory effort.  No retractions. Lungs CTAB. Gastrointestinal: Soft with mild left lower quadrant abdominal tenderness without any rebound or guarding. No distention. No CVA tenderness. Musculoskeletal: No lower extremity tenderness nor edema.  No joint effusions. Neurologic:  Normal speech and language. No gross focal neurologic deficits are appreciated. Skin:  Skin is warm, dry and intact. No rash noted. Psychiatric: Mood and affect are normal. Speech and behavior are normal.  ____________________________________________   LABS (all labs ordered are listed, but only abnormal results are displayed)  Labs Reviewed  URINALYSIS, COMPLETE (UACMP) WITH MICROSCOPIC - Abnormal; Notable for the following components:      Result Value   Color, Urine YELLOW (*)    APPearance CLEAR (*)    Hgb urine dipstick LARGE (*)    All other components within normal limits  CBC WITH DIFFERENTIAL/PLATELET - Abnormal; Notable for the following components:   RDW 14.9 (*)    Neutro Abs 7.2 (*)    All other components within normal limits  BASIC METABOLIC PANEL   ____________________________________________  EKG   ____________________________________________  RADIOLOGY  Interval development of left-sided hydronephrosis secondary to 4 mm calculus in the left UVJ. ____________________________________________   PROCEDURES  Procedure(s)  performed:   Procedures  Critical Care performed:   ____________________________________________   INITIAL IMPRESSION / ASSESSMENT AND PLAN / ED COURSE  Pertinent labs & imaging results that were available during my care of the patient were reviewed by me and considered in my medical decision making (see chart for details).  Differential diagnosis includes, but is not limited to, ovarian cyst, ovarian torsion, acute appendicitis, diverticulitis, urinary tract infection/pyelonephritis, endometriosis, bowel obstruction, colitis, renal colic, gastroenteritis, hernia, fibroids, endometriosis, pregnancy related pain including ectopic pregnancy, etc. As part of my medical decision making, I reviewed the following data within the electronic MEDICAL RECORD NUMBER Notes from prior ED visits  ----------------------------------------- 10:43 PM on 08/28/2018 -----------------------------------------  Discussed the case with the urologist, Dr.Sninsky, who believes that the stone is the same stone upon which the lithotripsy was attempted.  Recommends outpatient follow-up as long as the patient is comfortable.  The patient reports that she urinated and the pain went up to a 5 or 6.  She was given Toradol  at this time and she says that her pain is back down to 3 out of 10 and is tolerable.  I discussed with urologist follow-up in office any cyst report to his office at 1 PM.  Patient has hydrocodone at home as well as ibuprofen.  Reassuring lab work as well as urinalysis.  She will follow-up in the office tomorrow at 1 PM.  She is understanding the diagnosis as well as treatment and willing to comply. ____________________________________________   FINAL CLINICAL IMPRESSION(S) / ED DIAGNOSES  Kidney stone.  NEW MEDICATIONS STARTED DURING THIS VISIT:  New Prescriptions   No medications on file     Note:  This document was prepared using Dragon voice recognition software and may include unintentional  dictation errors.     Orbie Pyo, MD 08/28/18 2244    Orbie Pyo, MD 08/28/18 2245

## 2018-08-28 NOTE — ED Triage Notes (Signed)
First nurse note-left flank pain after lithotripsy. Denies known fever. Appears in pain, but no distress.

## 2018-08-29 ENCOUNTER — Ambulatory Visit (INDEPENDENT_AMBULATORY_CARE_PROVIDER_SITE_OTHER): Payer: Managed Care, Other (non HMO) | Admitting: Urology

## 2018-08-29 ENCOUNTER — Encounter: Payer: Self-pay | Admitting: Urology

## 2018-08-29 ENCOUNTER — Telehealth: Payer: Self-pay | Admitting: Urology

## 2018-08-29 VITALS — BP 133/80 | HR 80 | Ht 64.0 in | Wt 192.0 lb

## 2018-08-29 DIAGNOSIS — N201 Calculus of ureter: Secondary | ICD-10-CM

## 2018-08-29 MED ORDER — ONDANSETRON HCL 4 MG PO TABS: 4.0000 mg | ORAL_TABLET | Freq: Three times a day (TID) | ORAL | 0 refills | Status: DC | PRN

## 2018-08-29 NOTE — Telephone Encounter (Signed)
-----   Message from Billey Co, MD sent at 08/28/2018 10:15 PM EDT ----- Regarding: add on clinic patient Patient s/p recent swl with persistent stone and pain, in ED 9/5 PM. Please add her to my clinic schedule for Friday 9/6 at 1pm.  Thanks Billey Co, MD 08/28/2018

## 2018-08-29 NOTE — Progress Notes (Signed)
   08/29/2018 1:13 PM   [name] [name] 11-16-1956 [id number]  Reason for visit: Follow up left flank pain  HPI: I had the pleasure of seeing [name] [name] in urology clinic today for follow-up of left flank pain.  A 62 year old female who saw Dr. Erlene Quan on 08/19/2018 with left-sided flank pain and a 4 mm distal ureteral stone.  She elected to proceed with shockwave lithotripsy, and underwent this procedure on 08/22/2018.  She represented to the emergency department last night with recurrent sharp radiating left flank and groin pain, and repeat CT showed persistent 4 mm left distal ureteral calculus with hydronephrosis.  Urine was non-infected.  Her pain is currently well controlled today in clinic.  Denies fevers or chills.   ROS: Please see flowsheet from today's date for complete review of systems.  Physical Exam: BP 133/80   Pulse 80   Ht 5\' 4"  (1.626 m)   Wt 192 lb (87.1 kg)   BMI 32.96 kg/m    Constitutional:  Alert and oriented, No acute distress. Respiratory: Normal respiratory effort, no increased work of breathing. GI: Abdomen is soft, nontender, nondistended, no abdominal masses GU: Left CVA tenderness Skin: No rashes, bruises or suspicious lesions. Neurologic: Grossly intact, no focal deficits, moving all 4 extremities. Psychiatric: Normal mood and affect  Laboratory Data: Urinalysis yesterday noninfected  Pertinent Imaging:  I have personally reviewed the CT renal protocol dated 9/5.  Persistent left 4 mm distal ureteral calculus with upstream hydronephrosis.  Assessment & Plan:   In summary, [name] [name] is a 62 year old female with persistent left 4 mm distal ureteral stone and intermittent flank pain despite shockwave lithotripsy 8/30.  Her pain is currently well controlled, and she is afebrile with noninfected urine.  We discussed her options moving forward including medical expulsive therapy with Flomax, repeat shockwave lithotripsy, and scheduled  ureteroscopy, laser lithotripsy, and stent placement.  I recommended ureteroscopy in the setting of prior failed shockwave treatment.  She is in agreement for this moving forward and we will attempt to schedule this next Friday, September 13. Discussed return precautions including fever/chills/uncontrolled pain. I also added a prescription for PRN zofran.  Schedule LEFT URS/LL/stent (Likely Friday 9/13)  Billey Co, MD  [facility] [address], [place], [place] [address] [phone]

## 2018-08-29 NOTE — Telephone Encounter (Signed)
App has been made ° ° °[name] [name] °

## 2018-09-01 ENCOUNTER — Other Ambulatory Visit: Payer: Self-pay | Admitting: Radiology

## 2018-09-01 DIAGNOSIS — N201 Calculus of ureter: Secondary | ICD-10-CM

## 2018-09-03 ENCOUNTER — Other Ambulatory Visit: Payer: Self-pay

## 2018-09-03 ENCOUNTER — Telehealth: Payer: Self-pay | Admitting: Urology

## 2018-09-03 ENCOUNTER — Encounter
Admission: RE | Admit: 2018-09-03 | Discharge: 2018-09-03 | Disposition: A | Discharge status: Home / Self Care | Payer: Managed Care, Other (non HMO) | Source: Ambulatory Visit | Attending: Urology | Admitting: Urology

## 2018-09-03 HISTORY — DX: Nausea with vomiting, unspecified: R11.2

## 2018-09-03 HISTORY — DX: Adverse effect of unspecified anesthetic, initial encounter: T41.45XA

## 2018-09-03 HISTORY — DX: Personal history of urinary calculi: Z87.442

## 2018-09-03 HISTORY — DX: Gastro-esophageal reflux disease without esophagitis: K21.9

## 2018-09-03 HISTORY — DX: Other specified postprocedural states: Z98.890

## 2018-09-03 HISTORY — DX: Unspecified osteoarthritis, unspecified site: M19.90

## 2018-09-03 HISTORY — DX: Other complications of anesthesia, initial encounter: T88.59XA

## 2018-09-03 NOTE — Patient Instructions (Signed)
Your procedure is scheduled on: 09-05-18 FRIDAY Report to Same Day Surgery 2nd floor medical mall [facility] Entrance-take elevator on left to 2nd floor.  Check in with surgery information desk.) To find out your arrival time please call [phone] between 1PM - 3PM on 09-04-18 THURSDAY  Remember: Instructions that are not followed completely may result in serious medical risk, up to and including death, or upon the discretion of your surgeon and anesthesiologist your surgery may need to be rescheduled.    _x___ 1. Do not eat food after midnight the night before your procedure. NO GUM OR CANDY AFTER MIDNIGHT. You may drink clear liquids up to 2 hours before you are scheduled to arrive at the hospital for your procedure.  Do not drink clear liquids within 2 hours of your scheduled arrival to the hospital.  Clear liquids include  --Water or Apple juice without pulp  --Clear carbohydrate beverage such as ClearFast or Gatorade  --Black Coffee or Clear Tea (No milk, no creamers, do not add anything to the coffee or Tea   ____Ensure clear carbohydrate drink on the way to the hospital for bariatric patients  ____Ensure clear carbohydrate drink 3 hours before surgery for Dr Dwyane Luo patients if physician instructed.     __x__ 2. No Alcohol for 24 hours before or after surgery.   __x__3. No Smoking or e-cigarettes for 24 prior to surgery.  Do not use any chewable tobacco products for at least 6 hour prior to surgery   ____  4. Bring all medications with you on the day of surgery if instructed.    __x__ 5. Notify your doctor if there is any change in your medical condition     (cold, fever, infections).    x___6. On the morning of surgery brush your teeth with toothpaste and water.  You may rinse your mouth with mouth wash if you wish.  Do not swallow any toothpaste or mouthwash.   Do not wear jewelry, make-up, hairpins, clips or nail polish.  Do not wear lotions, powders, or perfumes.  You may wear deodorant.  Do not shave 48 hours prior to surgery. Men may shave face and neck.  Do not bring valuables to the hospital.    [facility] is not responsible for any belongings or valuables.               Contacts, dentures or bridgework may not be worn into surgery.  Leave your suitcase in the car. After surgery it may be brought to your room.  For patients admitted to the hospital, discharge time is determined by your treatment team.  _  Patients discharged the day of surgery will not be allowed to drive home.  You will need someone to drive you home and stay with you the night of your procedure.    Please read over the following fact sheets that you were given:   [facility] Preparing for Surgery  _x___ TAKE THE FOLLOWING MEDICATION THE MORNING OF SURGERY WITH A SMALL SIP OF WATER. These include:  1. ZOLOFT (SERTRALINE)  2. FLOMAX (TAMSULOSIN)  3. YOU MAY TAKE NORCO DAY OF SURGERY IF NEEDED  4.  5.  6.  ____Fleets enema or Magnesium Citrate as directed.   ____ Use CHG Soap or sage wipes as directed on instruction sheet   ____ Use inhalers on the day of surgery and bring to hospital day of surgery  ____ Stop Metformin and Janumet 2 days prior to surgery.  ____ Take 1/2 of usual insulin dose the night before surgery and none on the morning surgery.   ____ Follow recommendations from Cardiologist, Pulmonologist or PCP regarding stopping Aspirin, Coumadin, Plavix ,Eliquis, Effient, or Pradaxa, and Pletal.  X____Stop Anti-inflammatories such as Advil, Aleve, Ibuprofen, Motrin, Naproxen, Naprosyn, Goodies powders or aspirin products NOW-OK to take Tylenol    ____ Stop supplements until after surgery.    ____ Bring C-Pap to the hospital.

## 2018-09-03 NOTE — Telephone Encounter (Signed)
Insurance Authorization for Outpatient Surgery CPT [phone] - Left URS, U, stent placement  Diiag: N20.1 - L distal ureteral stone DOS: 09/05/2018 Dr. Marye Round  Per Christella Scheuermann [phone]), prior authorization is NOT required. Reference # [id number]  [id number] 09/03/18

## 2018-09-04 ENCOUNTER — Encounter
Admission: RE | Admit: 2018-09-04 | Discharge: 2018-09-04 | Disposition: A | Discharge status: Home / Self Care | Payer: Managed Care, Other (non HMO) | Source: Ambulatory Visit | Attending: Urology | Admitting: Urology

## 2018-09-04 DIAGNOSIS — K219 Gastro-esophageal reflux disease without esophagitis: Secondary | ICD-10-CM | POA: Diagnosis not present

## 2018-09-04 DIAGNOSIS — F419 Anxiety disorder, unspecified: Secondary | ICD-10-CM | POA: Diagnosis not present

## 2018-09-04 DIAGNOSIS — Z79899 Other long term (current) drug therapy: Secondary | ICD-10-CM | POA: Diagnosis not present

## 2018-09-04 DIAGNOSIS — N202 Calculus of kidney with calculus of ureter: Secondary | ICD-10-CM | POA: Diagnosis present

## 2018-09-04 DIAGNOSIS — I10 Essential (primary) hypertension: Secondary | ICD-10-CM | POA: Diagnosis not present

## 2018-09-04 MED ORDER — CIPROFLOXACIN IN D5W 400 MG/200ML IV SOLN
400.0000 mg | INTRAVENOUS | Status: AC
  Administered 2018-09-05: 400 mg via INTRAVENOUS

## 2018-09-05 ENCOUNTER — Telehealth: Payer: Self-pay | Admitting: Urology

## 2018-09-05 ENCOUNTER — Ambulatory Visit: Payer: Managed Care, Other (non HMO) | Admitting: Anesthesiology

## 2018-09-05 ENCOUNTER — Ambulatory Visit
Admission: RE | Admit: 2018-09-05 | Discharge: 2018-09-05 | Disposition: A | Discharge status: Home / Self Care | Payer: Managed Care, Other (non HMO) | Source: Ambulatory Visit | Attending: Urology | Admitting: Urology

## 2018-09-05 ENCOUNTER — Encounter
Admission: RE | Disposition: A | Discharge status: Home / Self Care | Payer: Self-pay | Source: Ambulatory Visit | Attending: Urology

## 2018-09-05 ENCOUNTER — Encounter: Payer: Self-pay | Admitting: *Deleted

## 2018-09-05 DIAGNOSIS — N201 Calculus of ureter: Secondary | ICD-10-CM

## 2018-09-05 DIAGNOSIS — I10 Essential (primary) hypertension: Secondary | ICD-10-CM | POA: Insufficient documentation

## 2018-09-05 DIAGNOSIS — N202 Calculus of kidney with calculus of ureter: Secondary | ICD-10-CM | POA: Diagnosis not present

## 2018-09-05 DIAGNOSIS — F419 Anxiety disorder, unspecified: Secondary | ICD-10-CM | POA: Insufficient documentation

## 2018-09-05 DIAGNOSIS — Z79899 Other long term (current) drug therapy: Secondary | ICD-10-CM | POA: Insufficient documentation

## 2018-09-05 DIAGNOSIS — K219 Gastro-esophageal reflux disease without esophagitis: Secondary | ICD-10-CM | POA: Insufficient documentation

## 2018-09-05 HISTORY — PX: CYSTOSCOPY/URETEROSCOPY/HOLMIUM LASER/STENT PLACEMENT: SHX6546

## 2018-09-05 SURGERY — CYSTOSCOPY/URETEROSCOPY/HOLMIUM LASER/STENT PLACEMENT
Anesthesia: General | Laterality: Left

## 2018-09-05 MED ORDER — FENTANYL CITRATE (PF) 100 MCG/2ML IJ SOLN
INTRAMUSCULAR | Status: AC
  Filled 2018-09-05: qty 2

## 2018-09-05 MED ORDER — FENTANYL CITRATE (PF) 100 MCG/2ML IJ SOLN
INTRAMUSCULAR | Status: DC | PRN
  Administered 2018-09-05: 100 ug via INTRAVENOUS

## 2018-09-05 MED ORDER — FAMOTIDINE 20 MG PO TABS
ORAL_TABLET | ORAL | Status: AC
  Administered 2018-09-05: 20 mg via ORAL
  Filled 2018-09-05: qty 1

## 2018-09-05 MED ORDER — PROPOFOL 10 MG/ML IV BOLUS
INTRAVENOUS | Status: AC
  Filled 2018-09-05: qty 20

## 2018-09-05 MED ORDER — SCOPOLAMINE 1 MG/3DAYS TD PT72
1.0000 | MEDICATED_PATCH | Freq: Once | TRANSDERMAL | Status: DC
  Administered 2018-09-05: 1.5 mg via TRANSDERMAL

## 2018-09-05 MED ORDER — CIPROFLOXACIN IN D5W 400 MG/200ML IV SOLN
INTRAVENOUS | Status: AC
  Filled 2018-09-05: qty 200

## 2018-09-05 MED ORDER — TAMSULOSIN HCL 0.4 MG PO CAPS: 0.4000 mg | ORAL_CAPSULE | Freq: Every day | ORAL | 0 refills | Status: DC

## 2018-09-05 MED ORDER — FAMOTIDINE 20 MG PO TABS
20.0000 mg | ORAL_TABLET | Freq: Once | ORAL | Status: AC
  Administered 2018-09-05: 20 mg via ORAL

## 2018-09-05 MED ORDER — FENTANYL CITRATE (PF) 100 MCG/2ML IJ SOLN: 25.0000 ug | INTRAMUSCULAR | Status: DC | PRN

## 2018-09-05 MED ORDER — PROPOFOL 10 MG/ML IV BOLUS
INTRAVENOUS | Status: DC | PRN
  Administered 2018-09-05: 150 mg via INTRAVENOUS

## 2018-09-05 MED ORDER — IOPAMIDOL (ISOVUE-M 200) INJECTION 41%
INTRAMUSCULAR | Status: DC | PRN
  Administered 2018-09-05: 15 mL

## 2018-09-05 MED ORDER — SCOPOLAMINE 1 MG/3DAYS TD PT72
MEDICATED_PATCH | TRANSDERMAL | Status: AC
  Administered 2018-09-05: 1.5 mg via TRANSDERMAL
  Filled 2018-09-05: qty 1

## 2018-09-05 MED ORDER — ONDANSETRON HCL 4 MG/2ML IJ SOLN
INTRAMUSCULAR | Status: DC | PRN
  Administered 2018-09-05: 4 mg via INTRAVENOUS

## 2018-09-05 MED ORDER — HYDROCODONE-ACETAMINOPHEN 5-325 MG PO TABS: 1.0000 | ORAL_TABLET | ORAL | 0 refills | Status: AC | PRN

## 2018-09-05 MED ORDER — LACTATED RINGERS IV SOLN
INTRAVENOUS | Status: DC
  Administered 2018-09-05: 09:00:00 via INTRAVENOUS
  Administered 2018-09-05: 50 mL/h via INTRAVENOUS

## 2018-09-05 MED ORDER — ONDANSETRON HCL 4 MG/2ML IJ SOLN: 4.0000 mg | Freq: Once | INTRAMUSCULAR | Status: DC | PRN

## 2018-09-05 MED ORDER — SULFAMETHOXAZOLE-TRIMETHOPRIM 800-160 MG PO TABS: 1.0000 | ORAL_TABLET | Freq: Every day | ORAL | 0 refills | Status: DC

## 2018-09-05 MED ORDER — DEXAMETHASONE SODIUM PHOSPHATE 10 MG/ML IJ SOLN
INTRAMUSCULAR | Status: DC | PRN
  Administered 2018-09-05: 5 mg via INTRAVENOUS

## 2018-09-05 MED ORDER — LIDOCAINE HCL (CARDIAC) PF 100 MG/5ML IV SOSY
PREFILLED_SYRINGE | INTRAVENOUS | Status: DC | PRN
  Administered 2018-09-05: 100 mg via INTRAVENOUS

## 2018-09-05 SURGICAL SUPPLY — 34 items
BAG DRAIN CYSTO-URO LG1000N (MISCELLANEOUS) ×2 IMPLANT
BRUSH SCRUB EZ 1% IODOPHOR (MISCELLANEOUS) ×2 IMPLANT
BULB IRRIG PATHFIND (MISCELLANEOUS) ×2 IMPLANT
CATH URETL 5X70 OPEN END (CATHETERS) IMPLANT
CNTNR SPEC 2.5X3XGRAD LEK (MISCELLANEOUS)
CONT SPEC 4OZ STER OR WHT (MISCELLANEOUS)
CONT SPEC 4OZ STRL OR WHT (MISCELLANEOUS)
CONTAINER SPEC 2.5X3XGRAD LEK (MISCELLANEOUS) IMPLANT
DRAPE UTILITY 15X26 TOWEL STRL (DRAPES) ×2 IMPLANT
FIBER LASER LITHO 273 (Laser) ×1 IMPLANT
GLOVE BIOGEL PI IND STRL 7.5 (GLOVE) ×1 IMPLANT
GLOVE BIOGEL PI INDICATOR 7.5 (GLOVE) ×1
GOWN STRL REUS W/ TWL LRG LVL3 (GOWN DISPOSABLE) ×1 IMPLANT
GOWN STRL REUS W/ TWL XL LVL3 (GOWN DISPOSABLE) ×1 IMPLANT
GOWN STRL REUS W/TWL LRG LVL3 (GOWN DISPOSABLE) ×2
GOWN STRL REUS W/TWL XL LVL3 (GOWN DISPOSABLE) ×2
INTRODUCER DILATOR DOUBLE (INTRODUCER) IMPLANT
KIT TURNOVER CYSTO (KITS) ×2 IMPLANT
PACK CYSTO AR (MISCELLANEOUS) ×2 IMPLANT
SENSORWIRE 0.038 NOT ANGLED (WIRE) ×2
SET CYSTO W/LG BORE CLAMP LF (SET/KITS/TRAYS/PACK) ×2 IMPLANT
SHEATH URETERAL 12FRX35CM (MISCELLANEOUS) IMPLANT
SOL .9 NS 3000ML IRR  AL (IV SOLUTION) ×1
SOL .9 NS 3000ML IRR AL (IV SOLUTION) ×1
SOL .9 NS 3000ML IRR UROMATIC (IV SOLUTION) ×1 IMPLANT
STENT URET 6FRX24 CONTOUR (STENTS) IMPLANT
STENT URET 6FRX26 CONTOUR (STENTS) IMPLANT
STRIP CLOSURE SKIN 1/4X4 (GAUZE/BANDAGES/DRESSINGS) ×1 IMPLANT
SURGILUBE 2OZ TUBE FLIPTOP (MISCELLANEOUS) ×2 IMPLANT
SWABSTK COMLB BENZOIN TINCTURE (MISCELLANEOUS) ×1 IMPLANT
SYR 10ML LL (SYRINGE) ×2 IMPLANT
TUBING ART PRESS 48 MALE/FEM (TUBING) ×2 IMPLANT
WATER STERILE IRR 1000ML POUR (IV SOLUTION) ×2 IMPLANT
WIRE SENSOR 0.038 NOT ANGLED (WIRE) ×1 IMPLANT

## 2018-09-05 NOTE — Anesthesia Postprocedure Evaluation (Signed)
Anesthesia Post Note  Patient: [name] [name]  Procedure(s) Performed: CYSTOSCOPY/URETEROSCOPY/HOLMIUM LASER/STENT PLACEMENT (Left )  Patient location during evaluation: PACU Anesthesia Type: General Level of consciousness: awake and alert and oriented Pain management: pain level controlled Vital Signs Assessment: post-procedure vital signs reviewed and stable Respiratory status: spontaneous breathing Cardiovascular status: blood pressure returned to baseline Anesthetic complications: no     Last Vitals:  Vitals:   09/05/18 1031 09/05/18 1048  BP: (!) 146/69 (!) 134/58  Pulse: 62 (!) 58  Resp: 16 16  Temp: 36.6 C   SpO2: 98%     Last Pain:  Vitals:   09/05/18 1031  TempSrc: Oral  PainSc: 0-No pain                 Terique Kawabata

## 2018-09-05 NOTE — Discharge Instructions (Signed)

## 2018-09-05 NOTE — Telephone Encounter (Signed)
[facility] called about a script that was called in for this patient today. Please call back @ [phone] her name is [name] [name]   Sharyn Lull

## 2018-09-05 NOTE — Telephone Encounter (Signed)
App made ° ° °[name] [name]  °

## 2018-09-05 NOTE — H&P (Signed)
UROLOGY H&P UPDATE  Agree with prior H&P dated 08/29/2018. Left 35mm distal ureteral stone s/p failed SWL  Cardiac: RRR Lungs: CTA bilaterally  Laterality: LEFT Procedure: LEFT URS/LL/Stent  Urinalysis: Negative 9/5  Informed consent obtained, we specifically discussed the risks of bleeding, infection including sepsis, need for additional procedures, and stent related symptoms.  Billey Co, MD 09/05/2018

## 2018-09-05 NOTE — Transfer of Care (Signed)
Immediate Anesthesia Transfer of Care Note  Patient: [name] [name]  Procedure(s) Performed: CYSTOSCOPY/URETEROSCOPY/HOLMIUM LASER/STENT PLACEMENT (Left )  Patient Location: PACU  Anesthesia Type:General  Level of Consciousness: awake  Airway & Oxygen Therapy: Patient Spontanous Breathing  Post-op Assessment: Report given to RN  Post vital signs: stable  Last Vitals:  Vitals Value Taken Time  BP    Temp    Pulse 86 09/05/2018  9:50 AM  Resp 27 09/05/2018  9:50 AM  SpO2 96 % 09/05/2018  9:50 AM  Vitals shown include unvalidated device data.  Last Pain:  Vitals:   09/05/18 0810  TempSrc: Tympanic  PainSc: 0-No pain         Complications: No apparent anesthesia complications

## 2018-09-05 NOTE — Addendum Note (Signed)
Addendum  created 09/05/18 1621 by Doreen Salvage, CRNA   Charge Capture section accepted

## 2018-09-05 NOTE — Telephone Encounter (Signed)
-----   Message from Billey Co, MD sent at 09/05/2018 10:21 AM EDT ----- Regarding: follow up RBUS Follow up with me in 6 weeks with renal ultrasound  Billey Co, MD 09/05/2018

## 2018-09-05 NOTE — Anesthesia Procedure Notes (Signed)
Procedure Name: LMA Insertion Performed by: Marcy Siren, CRNA Pre-anesthesia Checklist: Patient identified, Emergency Drugs available, Suction available, Patient being monitored and Timeout performed Patient Re-evaluated:Patient Re-evaluated prior to induction Oxygen Delivery Method: Circle system utilized Preoxygenation: Pre-oxygenation with 100% oxygen Induction Type: IV induction Ventilation: Mask ventilation without difficulty LMA: LMA inserted LMA Size: 4.0 Number of attempts: 1

## 2018-09-05 NOTE — Anesthesia Preprocedure Evaluation (Addendum)
Anesthesia Evaluation  Patient identified by MRN, date of birth, ID band Patient awake    Reviewed: Allergy & Precautions, NPO status , Patient's Chart, lab work & pertinent test results  History of Anesthesia Complications (+) PONV and history of anesthetic complications  Airway Mallampati: II  TM Distance: <3 FB     Dental  (+) Teeth Intact   Pulmonary neg pulmonary ROS,    Pulmonary exam normal        Cardiovascular hypertension, Normal cardiovascular exam     Neuro/Psych Anxiety negative neurological ROS     GI/Hepatic Neg liver ROS, GERD  ,  Endo/Other  negative endocrine ROS  Renal/GU Stones   negative genitourinary   Musculoskeletal  (+) Arthritis , Osteoarthritis,    Abdominal Normal abdominal exam  (+)   Peds negative pediatric ROS (+)  Hematology negative hematology ROS (+)   Anesthesia Other Findings   Reproductive/Obstetrics                            Anesthesia Physical Anesthesia Plan  ASA: II  Anesthesia Plan: General   Post-op Pain Management:    Induction: Intravenous  PONV Risk Score and Plan:   Airway Management Planned: Oral ETT  Additional Equipment:   Intra-op Plan:   Post-operative Plan: Extubation in OR  Informed Consent: I have reviewed the patients History and Physical, chart, labs and discussed the procedure including the risks, benefits and alternatives for the proposed anesthesia with the patient or authorized representative who has indicated his/her understanding and acceptance.   Dental advisory given  Plan Discussed with: CRNA and Surgeon  Anesthesia Plan Comments:         Anesthesia Quick Evaluation

## 2018-09-05 NOTE — Anesthesia Post-op Follow-up Note (Signed)
Anesthesia QCDR form completed.        

## 2018-09-05 NOTE — Op Note (Signed)
Date of procedure: 09/05/18  Preoperative diagnosis:  1. Left 4 mm distal ureteral stone  Postoperative diagnosis:  1. Left renal stones  Procedure: 1. Cystoscopy, left ureteroscopy, laser lithotripsy, stent placement 2. Left retrograde pyelogram with intraoperative interpretation  Surgeon: Nickolas Madrid, MD  Anesthesia: General  Complications: None  Intraoperative findings: Normal cystoscopy.  No stone seen in left distal ureter.  Flexible ureteroscopy demonstrated 2 small stones in the left collecting system and these were fragmented to dust.  Uncomplicated stent placement  EBL: Minimal  Specimens: None  Drains: Left 6FX 26 cm stent  Indication: [name] [name] is a 62 y.o. patient with 4 mm left distal ureteral stone and ongoing flank pain with CT demonstrating persistent stone in the distal ureter after shockwave lithotripsy..  After reviewing the management options for treatment, they elected to proceed with the above surgical procedure(s). We have discussed the potential benefits and risks of the procedure, side effects of the proposed treatment, the likelihood of the patient achieving the goals of the procedure, and any potential problems that might occur during the procedure or recuperation. Informed consent has been obtained.  Description of procedure:  The patient was taken to the operating room and general anesthesia was induced.  The patient was placed in the dorsal lithotomy position, prepped and draped in the usual sterile fashion, and preoperative antibiotics(Cipro) were administered. A preoperative time-out was performed.   A 21 French rigid cystoscope with a 30 degree lens was used to intubate the meatus.  Thorough cystoscopy demonstrated no lesions.  We turned our attention to the left ureteral orifice and a 0.035 sensor wire was advanced into the ureter and up to the kidney under fluoroscopic vision.  We then advanced a semirigid ureteroscope alongside the wire  into the distal ureter.  Thorough inspection up to the proximal ureter did not demonstrate any residual stone or stone fragments.  At this point I elected to pass a flexible ureteroscope over the wire up to the collecting system under fluoroscopic vision and we treated her two renal stones.  There was a small 2 to 3 mm stone in the midpole as well as a small 2 mm stone in the lower pole which were both dusted using the 273 m laser fiber.  Thorough inspection revealed no other stones.  Contrast was injected and opacified the collecting system to aid in stent placement, and no residual filling defects were seen.  The flexible ureteroscope was removed.  A 6 French X 26 cm stent was manually placed over the wire and the string was left attached.  A good curl was noted in the renal pelvis under fluoroscopy.  Disposition: Stable to PACU  Plan: Discharge home today.  Prophylactic Bactrim while stent in place.  Remove stent at home on Wednesday next week.  Follow-up renal ultrasound in 4 to 6 weeks.  Nickolas Madrid, MD

## 2018-09-12 ENCOUNTER — Ambulatory Visit: Payer: Managed Care, Other (non HMO) | Admitting: Urology

## 2018-09-30 ENCOUNTER — Ambulatory Visit (INDEPENDENT_AMBULATORY_CARE_PROVIDER_SITE_OTHER): Payer: Managed Care, Other (non HMO) | Admitting: Physician Assistant

## 2018-09-30 ENCOUNTER — Encounter: Payer: Self-pay | Admitting: Physician Assistant

## 2018-09-30 ENCOUNTER — Ambulatory Visit
Admission: RE | Admit: 2018-09-30 | Discharge: 2018-09-30 | Disposition: A | Discharge status: Home / Self Care | Payer: Managed Care, Other (non HMO) | Source: Ambulatory Visit | Attending: Urology | Admitting: Urology

## 2018-09-30 VITALS — BP 110/80 | HR 66 | Temp 98.4°F | Resp 16 | Ht 64.0 in | Wt 188.0 lb

## 2018-09-30 DIAGNOSIS — Z23 Encounter for immunization: Secondary | ICD-10-CM

## 2018-09-30 DIAGNOSIS — F4322 Adjustment disorder with anxiety: Secondary | ICD-10-CM | POA: Diagnosis not present

## 2018-09-30 DIAGNOSIS — E781 Pure hyperglyceridemia: Secondary | ICD-10-CM | POA: Diagnosis not present

## 2018-09-30 DIAGNOSIS — N201 Calculus of ureter: Secondary | ICD-10-CM | POA: Diagnosis present

## 2018-09-30 DIAGNOSIS — I10 Essential (primary) hypertension: Secondary | ICD-10-CM

## 2018-09-30 DIAGNOSIS — Z0000 Encounter for general adult medical examination without abnormal findings: Secondary | ICD-10-CM

## 2018-09-30 DIAGNOSIS — N132 Hydronephrosis with renal and ureteral calculous obstruction: Secondary | ICD-10-CM | POA: Diagnosis not present

## 2018-09-30 DIAGNOSIS — R7303 Prediabetes: Secondary | ICD-10-CM | POA: Diagnosis not present

## 2018-09-30 MED ORDER — LOSARTAN POTASSIUM-HCTZ 50-12.5 MG PO TABS: 1.0000 | ORAL_TABLET | Freq: Every day | ORAL | 1 refills | Status: DC

## 2018-09-30 NOTE — Patient Instructions (Signed)

## 2018-09-30 NOTE — Progress Notes (Signed)
Patient: [name] [name], Female    DOB: 12/01/1956, 62 y.o.   MRN: [id number] Visit Date: 09/30/2018  Today's Provider: Trinna Post, PA-C   Chief Complaint  Patient presents with  . Annual Exam   Subjective:    Annual physical exam [name] [name] is a 62 y.o. female who presents today for health maintenance and complete physical. She has had a recent episode of kidney stones that have required emergency room visits, multiple shockwave lithotripsy procedures and ureteral stent placement. Has been followed by Dr. Franchot Gallo in urology.   09/24/17 CPE 09/13/16 Pap-normal, [facility]  01/22/18 Mammogram-BI-RADS 1 01/22/18 BMD-osteopenia 2014 Colonoscopy, have requested records from [place] prior but have not received.  -----------------------------------------------------------------   Review of Systems  Constitutional: Negative.   HENT: Positive for tinnitus.   Eyes: Negative.   Respiratory: Negative.   Cardiovascular: Negative.   Gastrointestinal: Positive for constipation and diarrhea.  Endocrine: Negative.   Genitourinary: Negative.   Musculoskeletal: Positive for back pain.  Skin: Negative.   Allergic/Immunologic: Negative.   Neurological: Negative.   Hematological: Negative.   Psychiatric/Behavioral: Negative.     Social History      She  reports that she has never smoked. She has never used smokeless tobacco. She reports that she does not drink alcohol or use drugs.       Social History   Socioeconomic History  . Marital status: Married    Spouse name: Clair Gulling  . Number of children: 6  . Years of education: Not on file  . Highest education level: Not on file  Occupational History    Comment: retired  Scientific laboratory technician  . Financial resource strain: Not on file  . Food insecurity:    Worry: Not on file    Inability: Not on file  . Transportation needs:    Medical: Not on file    Non-medical: Not on file  Tobacco Use  . Smoking  status: Never Smoker  . Smokeless tobacco: Never Used  Substance and Sexual Activity  . Alcohol use: No    Frequency: Never  . Drug use: No  . Sexual activity: Not on file  Lifestyle  . Physical activity:    Days per week: Not on file    Minutes per session: Not on file  . Stress: Not on file  Relationships  . Social connections:    Talks on phone: Not on file    Gets together: Not on file    Attends religious service: Not on file    Active member of club or organization: Not on file    Attends meetings of clubs or organizations: Not on file    Relationship status: Not on file  Other Topics Concern  . Not on file  Social History Narrative  . Not on file    Past Medical History:  Diagnosis Date  . Anxiety    not an issue anymore. this was due to broken ankle  . Arthritis    BACK  . Complication of anesthesia    SLOW TO WAKE UP X 1 WITH ANKLE SURGERY  . GERD (gastroesophageal reflux disease)    OCC-NO MEDS  . History of kidney stones   . Hypertension 02/19/2017  . Hypertriglyceridemia 06/25/2016  . Osteopenia 06/25/2016  . PONV (postoperative nausea and vomiting)      Patient Active Problem List   Diagnosis Date Noted  . Hypertension 02/19/2017  . Hypertriglyceridemia 06/25/2016  . Osteopenia 06/25/2016  .  Buzzing in ear 06/25/2016  . Avitaminosis D 06/25/2016  . Cannot sleep 03/28/2007    Past Surgical History:  Procedure Laterality Date  . ANKLE FRACTURE SURGERY Left 2012  . CHOLECYSTECTOMY    . CYSTOSCOPY/URETEROSCOPY/HOLMIUM LASER/STENT PLACEMENT Left 09/05/2018   Procedure: CYSTOSCOPY/URETEROSCOPY/HOLMIUM LASER/STENT PLACEMENT;  Surgeon: Billey Co, MD;  Location: [facility] ORS;  Service: Urology;  Laterality: Left;  . EXTRACORPOREAL SHOCK WAVE LITHOTRIPSY Left 08/21/2018   Procedure: EXTRACORPOREAL SHOCK WAVE LITHOTRIPSY (ESWL);  Surgeon: Abbie Sons, MD;  Location: [facility] ORS;  Service: Urology;  Laterality: Left;  . FOOT SURGERY Right    neuroma  removed  . LITHOTRIPSY  1990'S  . TUBAL LIGATION    . VEIN LIGATION AND STRIPPING Left     Family History        Family Status  Relation Name Status  . Mat Aunt  (Not Specified)  . Mother  Deceased  . Father  Deceased       lung cancer  . Sister  Alive  . Brother  Alive  . Sister  Deceased at age 46       respiratory        Her family history includes Alzheimer's disease in her mother; Breast cancer (age of onset: 19) in her maternal aunt; Cancer in her father; Kidney disease in her mother.      Allergies  Allergen Reactions  . Oxycodone-Acetaminophen Other (See Comments)    Made her feel like "she was losing her mind". Unsure if the reaction was due to percocet or stress at the time.     Current Outpatient Medications:  .  calcium-vitamin D (OSCAL WITH D) 500-200 MG-UNIT tablet, Take 1 tablet by mouth See admin instructions. Take 2 tablets by mouth daily in the morning and 1 tablet in the evening, Disp: , Rfl:  .  losartan-hydrochlorothiazide (HYZAAR) 50-12.5 MG tablet, Take 1 tablet by mouth daily. (Patient taking differently: Take 1 tablet by mouth every morning. ), Disp: 90 tablet, Rfl: 1 .  Multiple Vitamin (MULTIVITAMIN) capsule, Take 1 capsule by mouth daily., Disp: , Rfl:  .  niacin 500 MG tablet, Take 500 mg by mouth 2 (two) times daily with a meal. , Disp: , Rfl:  .  sertraline (ZOLOFT) 50 MG tablet, Take 50 mg by mouth every morning. , Disp: , Rfl:  .  traZODone (DESYREL) 100 MG tablet, Take 100 mg by mouth at bedtime as needed for sleep. , Disp: , Rfl:  .  dicyclomine (BENTYL) 10 MG capsule, Take 1 capsule (10 mg total) by mouth 3 (three) times daily before meals. (Patient taking differently: Take 10 mg by mouth as needed. ), Disp: 90 capsule, Rfl: 0   Patient Care Team: Paulene Floor as PCP - General (Physician Assistant)      Objective:   Vitals: BP 110/80 (BP Location: Left Arm, Patient Position: Sitting, Cuff Size: Normal)   Pulse 66   Temp 98.4  F (36.9 C) (Oral)   Resp 16   Ht 5\' 4"  (1.626 m)   Wt 188 lb (85.3 kg)   SpO2 99%   BMI 32.27 kg/m    Vitals:   09/30/18 1054  BP: 110/80  Pulse: 66  Resp: 16  Temp: 98.4 F (36.9 C)  TempSrc: Oral  SpO2: 99%  Weight: 188 lb (85.3 kg)  Height: 5\' 4"  (1.626 m)     Physical Exam  Constitutional: She is oriented to person, place, and time. She appears well-developed and well-nourished.  HENT:  Right Ear: Tympanic membrane and external ear normal.  Left Ear: Tympanic membrane and external ear normal.  Nose: Nose normal.  Mouth/Throat: Oropharynx is clear and moist. No oropharyngeal exudate.  Neck: Neck supple.  Cardiovascular: Normal rate.  Pulmonary/Chest: Effort normal and breath sounds normal.  Abdominal: Soft. Bowel sounds are normal.  Lymphadenopathy:    She has no cervical adenopathy.  Neurological: She is alert and oriented to person, place, and time.  Skin: Skin is warm and dry.  Psychiatric: She has a normal mood and affect. Her behavior is normal.     Depression Screen PHQ 2/9 Scores 09/30/2018 09/25/2017 02/19/2017  PHQ - 2 Score 0 0 0  PHQ- 9 Score 2 2 3       Assessment & Plan:     Routine Health Maintenance and Physical Exam  Exercise Activities and Dietary recommendations Goals   None     Immunization History  Administered Date(s) Administered  . Zoster 06/18/2011    Health Maintenance  Topic Date Due  . Samul Dada  08/22/1974  . COLONOSCOPY  08/22/2005  . INFLUENZA VACCINE  07/24/2018  . PAP SMEAR  09/14/2019  . MAMMOGRAM  01/23/2020  . Hepatitis C Screening  Completed  . HIV Screening  Completed     Discussed health benefits of physical activity, and encouraged her to engage in regular exercise appropriate for her age and condition.    1. Annual physical exam  - CBC with Differential/Platelet - Hemoglobin A1c - MM Digital Screening; Future  2. Adjustment disorder with anxiety  Currently seeing Dr. Nicolasa Ducking and is on  zoloft.   3. Hypertriglyceridemia  - Lipid Panel With LDL/HDL Ratio  4. Essential hypertension  - Comprehensive metabolic panel - TSH - losartan-hydrochlorothiazide (HYZAAR) 50-12.5 MG tablet; Take 1 tablet by mouth daily.  Dispense: 90 tablet; Refill: 1 - losartan (COZAAR) 50 MG tablet; Take 1 tablet (50 mg total) by mouth daily.  Dispense: 90 tablet; Refill: 0 - hydrochlorothiazide (MICROZIDE) 12.5 MG capsule; Take 1 capsule (12.5 mg total) by mouth daily.  Dispense: 90 capsule; Refill: 0  5. Prediabetes  - Hemoglobin A1c  6. Need for influenza vaccination  Return in about 6 months (around 04/01/2019) for htn .  The entirety of the information documented in the History of Present Illness, Review of Systems and Physical Exam were personally obtained by me. Portions of this information were initially documented by Lynford Humphrey, CMA and reviewed by me for thoroughness and accuracy.    --------------------------------------------------------------------    Trinna Post, PA-C  [facility]

## 2018-10-01 ENCOUNTER — Telehealth: Payer: Self-pay | Admitting: Physician Assistant

## 2018-10-01 NOTE — Telephone Encounter (Signed)
[facility] sent a fax stating that the manufacturer of losartan-hydrochlorothiazide (HYZAAR) 50-12.5 MG tablet is currently unable to supply this medication.  They are requesting a replacement 90 day prescription with refills.  They state that losartan 50mg  tab and HTCZ 12.5mg  cap is abvailable.

## 2018-10-02 ENCOUNTER — Telehealth: Payer: Self-pay | Admitting: Family Medicine

## 2018-10-02 MED ORDER — HYDROCHLOROTHIAZIDE 12.5 MG PO CAPS: 12.5000 mg | ORAL_CAPSULE | Freq: Every day | ORAL | 0 refills | Status: DC

## 2018-10-02 MED ORDER — LOSARTAN POTASSIUM 50 MG PO TABS: 50.0000 mg | ORAL_TABLET | Freq: Every day | ORAL | 0 refills | Status: DC

## 2018-10-02 NOTE — Telephone Encounter (Signed)
-----   Message from Billey Co, MD sent at 10/02/2018  7:59 AM EDT ----- Please let her know her ultrasound looks good. No significant residual hydronephrosis/swelling in the kidney. Keep scheduled follow up 10/31.  Nickolas Madrid, MD 10/02/2018

## 2018-10-02 NOTE — Telephone Encounter (Signed)
Patient notified

## 2018-10-02 NOTE — Telephone Encounter (Signed)
Sent in split medication to home delivery pharmacy. Can you pease call patient and let her know we had to change it because the combo pill was unavailable? Thank you.

## 2018-10-17 ENCOUNTER — Other Ambulatory Visit: Payer: Self-pay | Admitting: Physician Assistant

## 2018-10-17 DIAGNOSIS — R102 Pelvic and perineal pain: Secondary | ICD-10-CM

## 2018-10-18 MED ORDER — DICYCLOMINE HCL 10 MG PO CAPS: 10.0000 mg | ORAL_CAPSULE | Freq: Three times a day (TID) | ORAL | 0 refills | Status: DC

## 2018-10-21 ENCOUNTER — Ambulatory Visit: Payer: Managed Care, Other (non HMO) | Admitting: Urology

## 2018-10-22 ENCOUNTER — Encounter: Payer: Self-pay | Admitting: Physician Assistant

## 2018-10-22 ENCOUNTER — Telehealth: Payer: Self-pay

## 2018-10-22 LAB — COMPREHENSIVE METABOLIC PANEL
ALT: 15 IU/L (ref 0–32)
AST: 20 IU/L (ref 0–40)
Albumin/Globulin Ratio: 1.7 (ref 1.2–2.2)
Albumin: 4.2 g/dL (ref 3.6–4.8)
Alkaline Phosphatase: 87 IU/L (ref 39–117)
BUN/Creatinine Ratio: 14 (ref 12–28)
BUN: 14 mg/dL (ref 8–27)
Bilirubin Total: 0.5 mg/dL (ref 0.0–1.2)
CO2: 24 mmol/L (ref 20–29)
Calcium: 9.8 mg/dL (ref 8.7–10.3)
Chloride: 102 mmol/L (ref 96–106)
Creatinine, Ser: 0.99 mg/dL (ref 0.57–1.00)
GFR calc Af Amer: 70 mL/min/{1.73_m2} (ref >59)
GFR calc non Af Amer: 61 mL/min/{1.73_m2} (ref >59)
Globulin, Total: 2.5 g/dL (ref 1.5–4.5)
Glucose: 90 mg/dL (ref 65–99)
Potassium: 4 mmol/L (ref 3.5–5.2)
Sodium: 142 mmol/L (ref 134–144)
Total Protein: 6.7 g/dL (ref 6.0–8.5)

## 2018-10-22 LAB — CBC WITH DIFFERENTIAL/PLATELET
Basophils Absolute: 0 10*3/uL (ref 0.0–0.2)
Basos: 1 %
EOS (ABSOLUTE): 0.1 10*3/uL (ref 0.0–0.4)
Eos: 3 %
Hematocrit: 39.1 % (ref 34.0–46.6)
Hemoglobin: 12.7 g/dL (ref 11.1–15.9)
Immature Grans (Abs): 0 10*3/uL (ref 0.0–0.1)
Immature Granulocytes: 0 %
Lymphocytes Absolute: 1.1 10*3/uL (ref 0.7–3.1)
Lymphs: 23 %
MCH: 28.3 pg (ref 26.6–33.0)
MCHC: 32.5 g/dL (ref 31.5–35.7)
MCV: 87 fL (ref 79–97)
Monocytes Absolute: 0.4 10*3/uL (ref 0.1–0.9)
Monocytes: 7 %
Neutrophils Absolute: 3.4 10*3/uL (ref 1.4–7.0)
Neutrophils: 66 %
Platelets: 316 10*3/uL (ref 150–450)
RBC: 4.48 x10E6/uL (ref 3.77–5.28)
RDW: 13.9 % (ref 12.3–15.4)
WBC: 5.1 10*3/uL (ref 3.4–10.8)

## 2018-10-22 LAB — LIPID PANEL WITH LDL/HDL RATIO
Cholesterol, Total: 203 mg/dL — ABNORMAL HIGH (ref 100–199)
HDL: 51 mg/dL (ref >39)
LDL Calculated: 135 mg/dL — ABNORMAL HIGH (ref 0–99)
LDl/HDL Ratio: 2.6 ratio (ref 0.0–3.2)
Triglycerides: 86 mg/dL (ref 0–149)
VLDL Cholesterol Cal: 17 mg/dL (ref 5–40)

## 2018-10-22 LAB — HEMOGLOBIN A1C
Est. average glucose Bld gHb Est-mCnc: 117 mg/dL
Hgb A1c MFr Bld: 5.7 % — ABNORMAL HIGH (ref 4.8–5.6)

## 2018-10-22 LAB — TSH: TSH: 1.54 u[IU]/mL (ref 0.450–4.500)

## 2018-10-22 NOTE — Telephone Encounter (Signed)
-----   Message from Trinna Post, [place] sent at 10/22/2018  8:26 AM EDT ----- [name] [name] is stable, cholesterol elevated but not requiring treatment yet. A1c is 5.7% in the prediabetic range but lower than last time.

## 2018-10-22 NOTE — Telephone Encounter (Signed)
Patient advised as below.  

## 2018-10-23 ENCOUNTER — Ambulatory Visit (INDEPENDENT_AMBULATORY_CARE_PROVIDER_SITE_OTHER): Payer: Managed Care, Other (non HMO) | Admitting: Urology

## 2018-10-23 ENCOUNTER — Encounter: Payer: Self-pay | Admitting: Urology

## 2018-10-23 VITALS — BP 105/67 | HR 80 | Ht 64.0 in | Wt 185.0 lb

## 2018-10-23 DIAGNOSIS — N200 Calculus of kidney: Secondary | ICD-10-CM | POA: Diagnosis not present

## 2018-10-23 NOTE — Progress Notes (Signed)
   10/23/2018 3:20 PM   MIKU UDALL October 24, 1956 [id number]  Reason for visit: Follow up LEFT URS/LL/Stent  HPI: I saw Ms. Balik back in urology clinic in follow-up after left ureteroscopy, laser lithotripsy, and stent placement on Dangler 09/05/2018.  She is a 62 year old female with a distant history stone disease who had originally presented with a left 4 mm distal ureteral stone and had previously undergone shockwave lithotripsy with Dr. Erlene Quan 08/21/2018.  She had persistent pain and CT in the ED showed persistence of the left sided distal stone.  She elected proceed to the operating room for ureteroscopy.  However at the time of ureteroscopy there were no stones found in the left distal ureter, however we treated 2 very small stones in the left collecting system.  At this point she has no residual stone.  Her stent was uneventfully removed on a Dangler 4 days after surgery.  She denies any flank pain or hematuria.  Renal ultrasound shows almost complete resolution of her previous left-sided hydronephrosis, and no residual stones.  We discussed general stone prevention strategies including adequate hydration with goal of producing 2.5 L of urine daily, increasing citric acid intake, increasing calcium intake during high oxalate meals, minimizing animal protein, and decreasing salt intake. Information about dietary recommendations given today.   She would like to follow-up as needed  Please note that 10 minutes were spent with the patient, greater than 50% were spent in direct patient education and counseling regarding stone disease and stone prevention.  Kennedy [address] [place], [place] [address] [phone]

## 2019-01-23 ENCOUNTER — Other Ambulatory Visit: Payer: Self-pay | Admitting: Physician Assistant

## 2019-01-23 DIAGNOSIS — I10 Essential (primary) hypertension: Secondary | ICD-10-CM

## 2019-01-26 MED ORDER — LOSARTAN POTASSIUM-HCTZ 50-12.5 MG PO TABS: 1.0000 | ORAL_TABLET | Freq: Every day | ORAL | 1 refills | Status: DC

## 2019-01-27 ENCOUNTER — Encounter: Payer: Self-pay | Admitting: Physician Assistant

## 2019-01-28 IMAGING — US US RENAL
1 series · 14 of 25 positions shown · non-contrast
Comparison: 08/28/2018 CT.

CLINICAL DATA: 62-year-old female with left ureteral stone.
Evaluate residual hydronephrosis. Subsequent encounter.

EXAM:
RENAL / URINARY TRACT ULTRASOUND COMPLETE

[Series 1: us renal · 0.22mm/px · 14 of 32 slices shown]
[im 1/32]
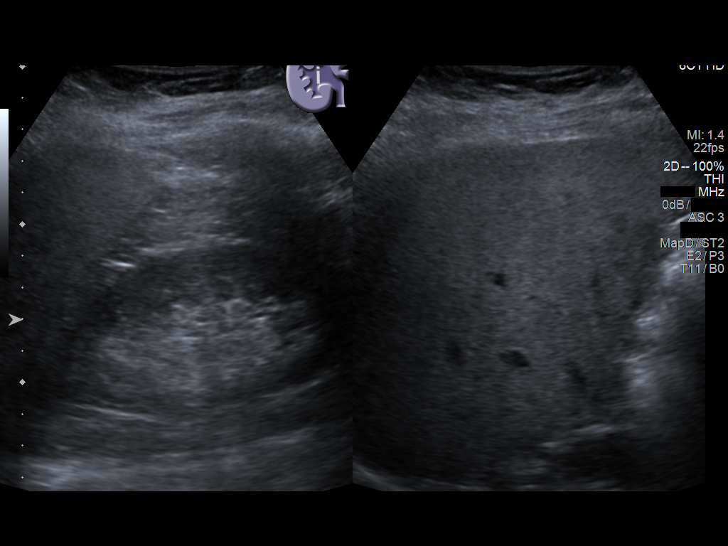
[im 3/32]
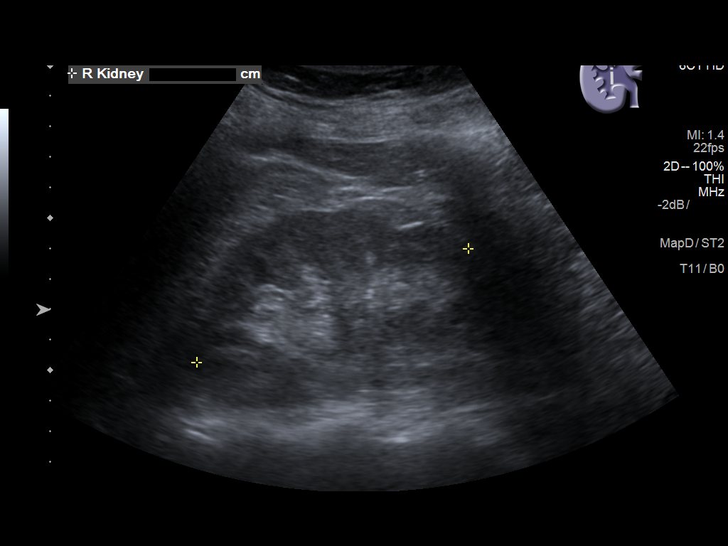
[im 6/32]
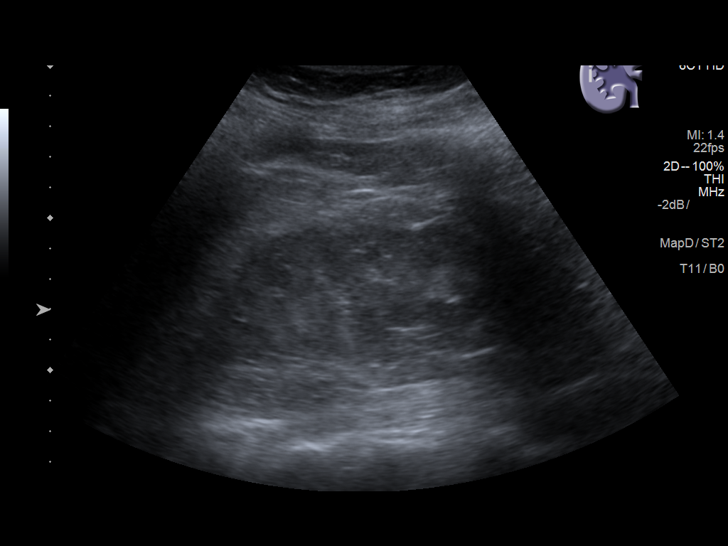
[im 8/32]
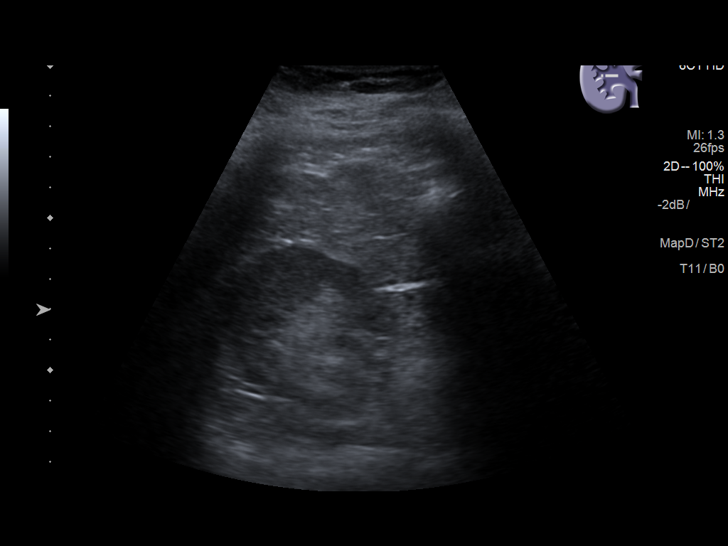
[im 11/32]
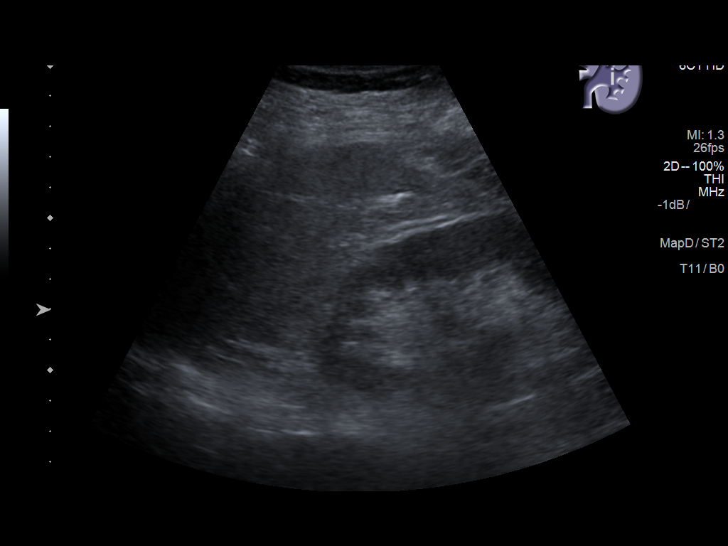
[im 12/32]
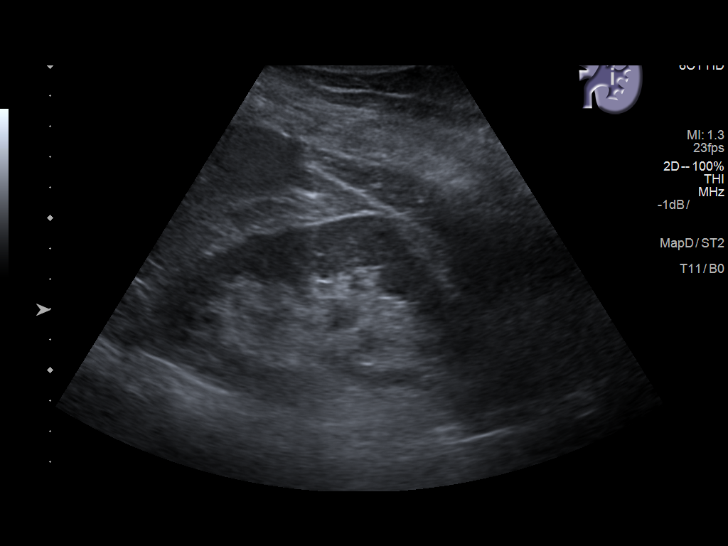
[im 15/32]
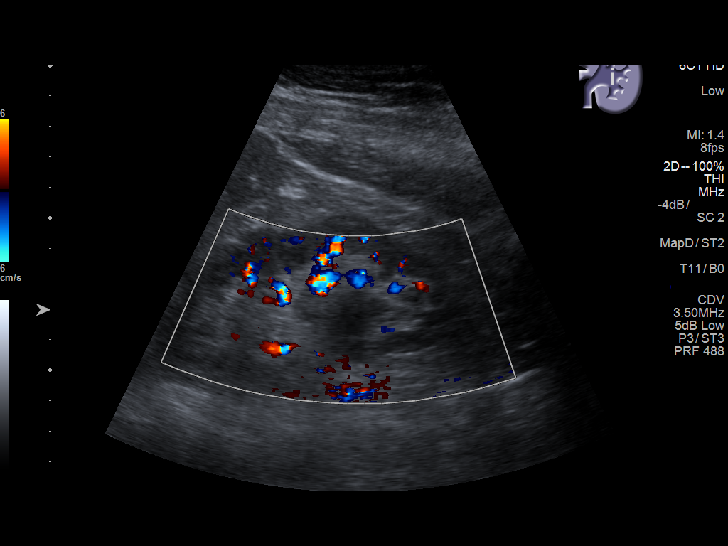
[im 17/32]
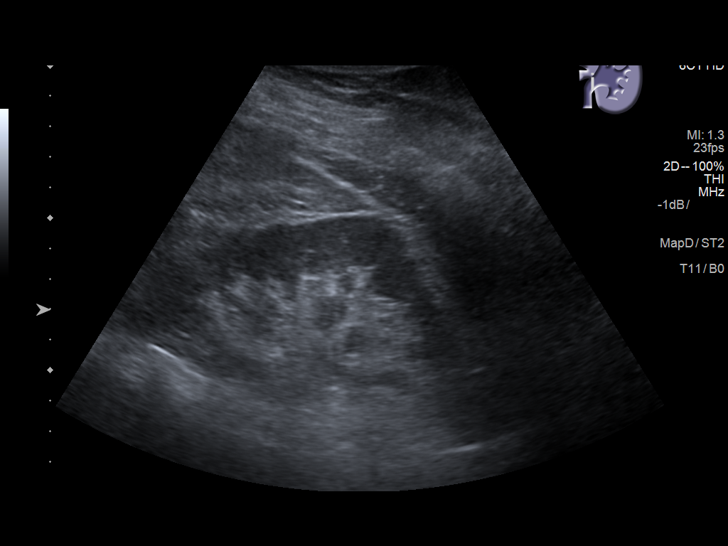
[im 20/32]
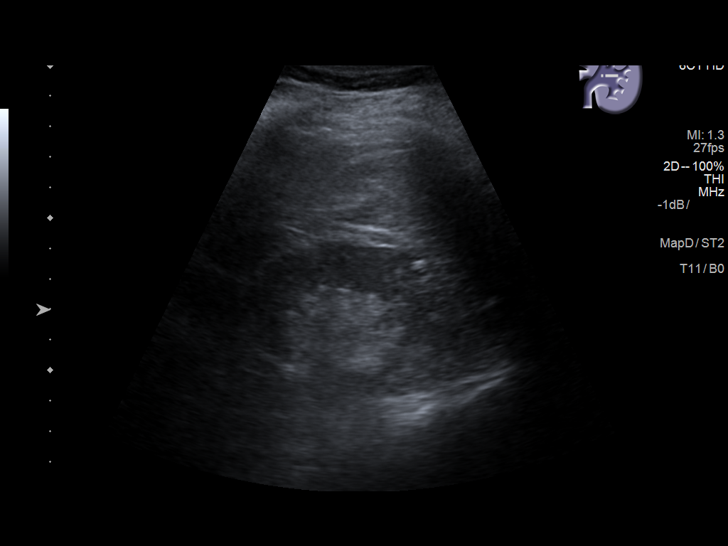
[im 21/32]
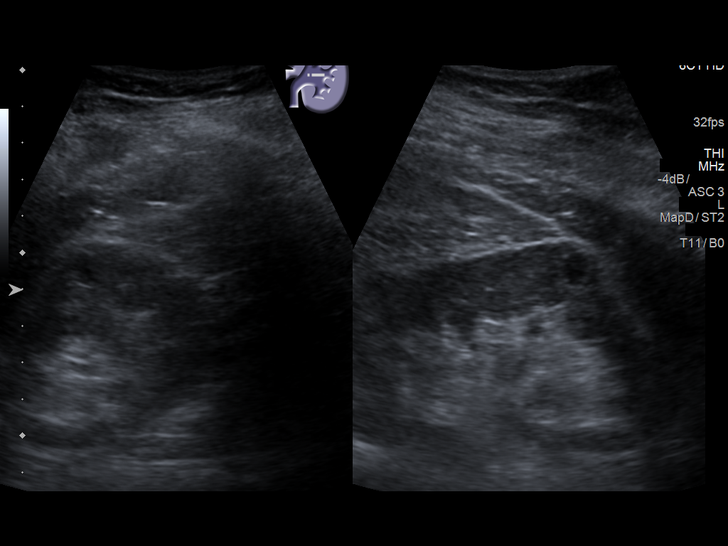
[im 24/32]
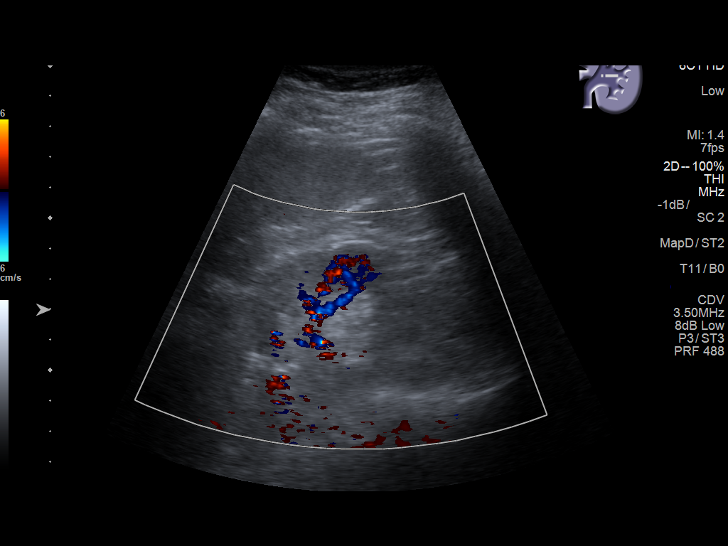
[im 26/32]
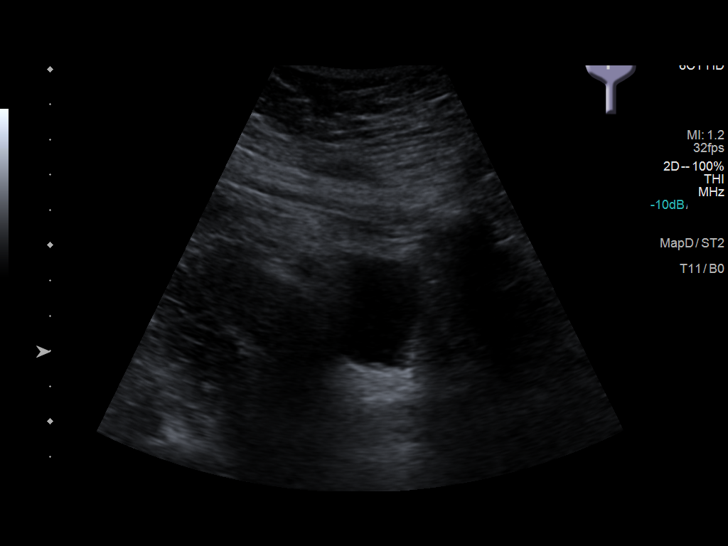
[im 29/32]
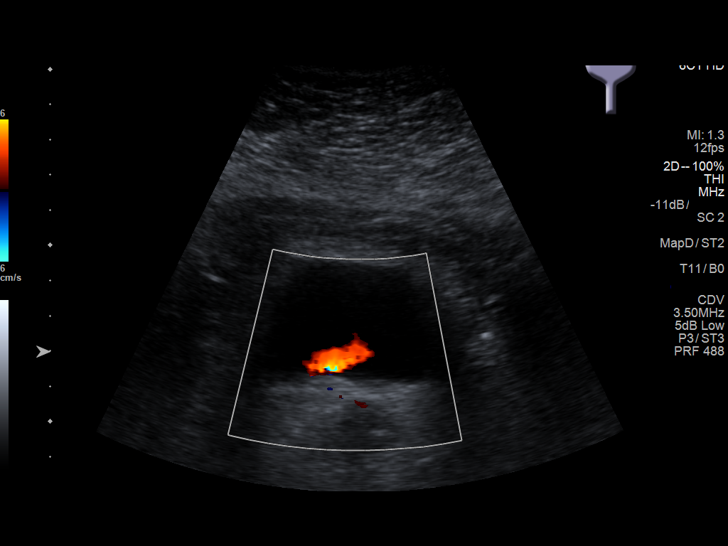
[im 32/32]
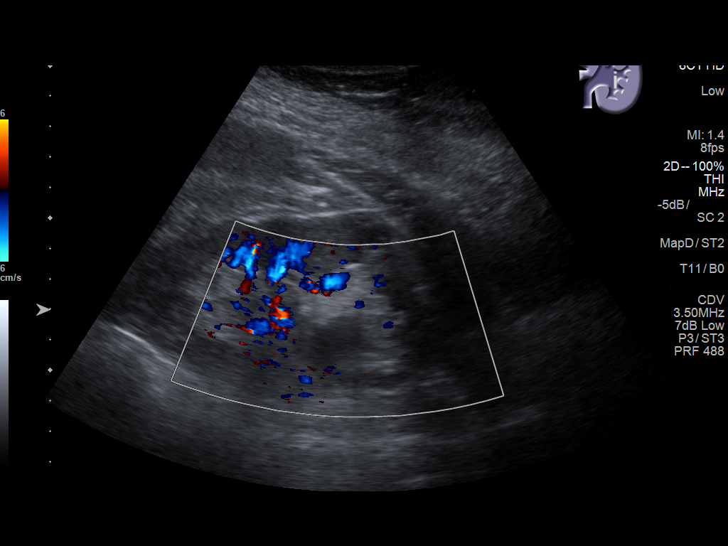

[14 of 25 positions shown; findings below may reference images not displayed]

FINDINGS: Right Kidney:

Length: 9.7 cm. Echogenicity within normal limits. No mass or
hydronephrosis visualized.

Left Kidney:

Length: 10.4 cm. Echogenicity within normal limits. Mild fullness
left renal pelvis. 1 x 0.7 x 0.8 cm cyst.

Bladder:

Appears normal for degree of bladder distention.
IMPRESSION: Interval decrease in left-sided hydronephrosis with residual mild
fullness of left renal pelvis.

## 2019-04-01 ENCOUNTER — Encounter: Payer: Self-pay | Admitting: Physician Assistant

## 2019-04-01 ENCOUNTER — Other Ambulatory Visit: Payer: Self-pay

## 2019-04-01 ENCOUNTER — Ambulatory Visit (INDEPENDENT_AMBULATORY_CARE_PROVIDER_SITE_OTHER): Payer: Self-pay | Admitting: Physician Assistant

## 2019-04-01 VITALS — BP 128/78

## 2019-04-01 DIAGNOSIS — F32A Depression, unspecified: Secondary | ICD-10-CM

## 2019-04-01 DIAGNOSIS — I10 Essential (primary) hypertension: Secondary | ICD-10-CM

## 2019-04-01 DIAGNOSIS — R7303 Prediabetes: Secondary | ICD-10-CM | POA: Insufficient documentation

## 2019-04-01 DIAGNOSIS — F329 Major depressive disorder, single episode, unspecified: Secondary | ICD-10-CM

## 2019-04-01 NOTE — Patient Instructions (Signed)

## 2019-04-01 NOTE — Progress Notes (Signed)
Patient: [name] [name] Female    DOB: 06-Apr-1956   63 y.o.   MRN: [id number] Visit Date: 04/01/2019  Today's Provider: Trinna Post, PA-C   Chief Complaint  Patient presents with  . Hypertension  . Hyperglycemia  . Depression   Subjective:    Virtual Visit via Video Note  I connected with [name] [name] on 04/01/19 at  9:00 AM EDT by a video enabled telemedicine application and verified that I am speaking with the correct person using two identifiers.   I discussed the limitations of evaluation and management by telemedicine and the availability of in person appointments. The patient expressed understanding and agreed to proceed.  HPI   Patient presenting for follow up today. Reports she is doing well. Still caring for chronically ill granddaughter. Husband doing well.  HTN: Taking losartan 50 mg daily and hctz 12.5 mg daily. Medications had been split due to backorder of combination medication. 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 Reports it has been normal and has had no chest pain, SOB.  BP Readings from Last 3 Encounters:  04/01/19 128/78  10/23/18 105/67  09/30/18 110/80   CMP Latest Ref Rng & Units 10/21/2018 08/28/2018 08/15/2018  Glucose 65 - 99 mg/dL 90 92 112(H)  BUN 8 - 27 mg/dL 14 17 16   Creatinine 0.57 - 1.00 mg/dL 0.99 0.92 1.13(H)  Sodium 134 - 144 mmol/L 142 140 142  Potassium 3.5 - 5.2 mmol/L 4.0 4.2 3.6  Chloride 96 - 106 mmol/L 102 109 107  CO2 20 - 29 mmol/L 24 25 28   Calcium 8.7 - 10.3 mg/dL 9.8 9.1 9.1  Total Protein 6.0 - 8.5 g/dL 6.7 - -  Total Bilirubin 0.0 - 1.2 mg/dL 0.5 - -  Alkaline Phos 39 - 117 IU/L 87 - -  AST 0 - 40 IU/L 20 - -  ALT 0 - 32 IU/L 15 - -   Prediabetes:   Lab Results  Component Value Date   HGBA1C 5.7 (H) 10/21/2018   Depression and anxiety: Followed by Dr. Nicolasa Ducking in psychiatry and is doing well with 50 mg zoloft daily.    Allergies  Allergen Reactions  .  Oxycodone-Acetaminophen Other (See Comments)    Made her feel like "she was losing her mind". Unsure if the reaction was due to percocet or stress at the time.     Current Outpatient Medications:  .  calcium-vitamin D (OSCAL WITH D) 500-200 MG-UNIT tablet, Take 1 tablet by mouth See admin instructions. Take 2 tablets by mouth daily in the morning and 1 tablet in the evening, Disp: , Rfl:  .  losartan-hydrochlorothiazide (HYZAAR) 50-12.5 MG tablet, Take 1 tablet by mouth daily., Disp: 90 tablet, Rfl: 1 .  Multiple Vitamin (MULTIVITAMIN) capsule, Take 1 capsule by mouth daily., Disp: , Rfl:  .  niacin 500 MG tablet, Take 500 mg by mouth 2 (two) times daily with a meal. , Disp: , Rfl:  .  sertraline (ZOLOFT) 50 MG tablet, Take 50 mg by mouth every morning. , Disp: , Rfl:  .  traZODone (DESYREL) 100 MG tablet, Take 100 mg by mouth at bedtime as needed for sleep. , Disp: , Rfl:  .  dicyclomine (BENTYL) 10 MG capsule, Take 1 capsule (10 mg total) by mouth 3 (three) times daily before meals., Disp: 90 capsule, Rfl: 0  Review of Systems  All other systems reviewed and are negative.   Social History  Tobacco Use  . Smoking status: Never Smoker  . Smokeless tobacco: Never Used  Substance Use Topics  . Alcohol use: No    Frequency: Never      Objective:   BP 128/78 (BP Location: Right Arm, Patient Position: Sitting, Cuff Size: Large)  Vitals:   04/01/19 0829  BP: 128/78     Physical Exam Constitutional:      Appearance: Normal appearance.  Pulmonary:     Effort: Pulmonary effort is normal. No respiratory distress.  Neurological:     Mental Status: She is alert. Mental status is at baseline.  Psychiatric:        Mood and Affect: Mood normal.        Behavior: Behavior normal.         Assessment & Plan    1. Essential hypertension  Well controlled, continue losartan 50 mg daily and HCTZ 12.5 mg daily. F/u in 6 mo for CPE and HTN.  2. Prediabetes  Stable.  3.  Depression, unspecified depression type  Stable, continues to follow with Dr. Nicolasa Ducking and remains on 50 mg zoloft daily.  The entirety of the information documented in the History of Present Illness, Review of Systems and Physical Exam were personally obtained by me. Portions of this information were initially documented by Doran Clay, LPN and reviewed by me for thoroughness and accuracy.         Trinna Post, PA-C  [facility]

## 2019-04-29 ENCOUNTER — Other Ambulatory Visit: Payer: Self-pay | Admitting: Physician Assistant

## 2019-04-29 DIAGNOSIS — I10 Essential (primary) hypertension: Secondary | ICD-10-CM

## 2019-05-26 ENCOUNTER — Other Ambulatory Visit: Payer: Self-pay

## 2019-05-26 ENCOUNTER — Ambulatory Visit (INDEPENDENT_AMBULATORY_CARE_PROVIDER_SITE_OTHER): Payer: BLUE CROSS/BLUE SHIELD | Admitting: Physician Assistant

## 2019-05-26 ENCOUNTER — Encounter: Payer: Self-pay | Admitting: Physician Assistant

## 2019-05-26 VITALS — BP 128/70 | HR 83 | Temp 98.7°F | Ht 64.5 in

## 2019-05-26 DIAGNOSIS — M545 Low back pain, unspecified: Secondary | ICD-10-CM

## 2019-05-26 MED ORDER — CYCLOBENZAPRINE HCL 5 MG PO TABS: 5.0000 mg | ORAL_TABLET | Freq: Every day | ORAL | 0 refills | Status: DC

## 2019-05-26 NOTE — Progress Notes (Signed)
Patient: [name] [name] Female    DOB: 1956/12/17   63 y.o.   MRN: [id number] Visit Date: 05/26/2019  Today's Provider: Trinna Post, PA-C   Chief Complaint  Patient presents with  . Back Pain    05/16/19   Subjective:     Back Pain  This is a new problem. The current episode started 1 to 4 weeks ago. The problem occurs constantly. The pain is present in the lumbar spine. The quality of the pain is described as burning, stabbing and aching. The pain does not radiate. The pain is at a severity of 7/10. The pain is moderate. The pain is the same all the time. The symptoms are aggravated by bending, lying down, standing, twisting and position. Stiffness is present at night. She has tried NSAIDs for the symptoms. The treatment provided no relief.   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 No fevers, abdominal pain, dysuria, numbness or tingling in lower extremities. She has had kidney stones before but it does not feel like this. She has tolerated muscle relaxer prior.   Allergies  Allergen Reactions  . Oxycodone-Acetaminophen Other (See Comments)    Made her feel like "she was losing her mind". Unsure if the reaction was due to percocet or stress at the time.     Current Outpatient Medications:  .  calcium-vitamin D (OSCAL WITH D) 500-200 MG-UNIT tablet, Take 1 tablet by mouth See admin instructions. Take 2 tablets by mouth daily in the morning and 1 tablet in the evening, Disp: , Rfl:  .  dicyclomine (BENTYL) 10 MG capsule, Take 1 capsule (10 mg total) by mouth 3 (three) times daily before meals., Disp: 90 capsule, Rfl: 0 .  hydrochlorothiazide (HYDRODIURIL) 12.5 MG tablet, TAKE ONE TABLET EVERY DAY WITH LOSARTAN 50MG , Disp: 90 tablet, Rfl: 1 .  losartan (COZAAR) 50 MG tablet, TAKE ONE TABLET EVERY DAY WITH HYDROCHLOROTHIAZIDE 12.5MG  TAB, Disp: 90 tablet, Rfl: 1 .  Multiple Vitamin (MULTIVITAMIN)  capsule, Take 1 capsule by mouth daily., Disp: , Rfl:  .  niacin 500 MG tablet, Take 500 mg by mouth 2 (two) times daily with a meal. , Disp: , Rfl:  .  sertraline (ZOLOFT) 50 MG tablet, Take 50 mg by mouth every morning. , Disp: , Rfl:  .  traZODone (DESYREL) 100 MG tablet, Take 100 mg by mouth at bedtime as needed for sleep. , Disp: , Rfl:   Review of Systems  Musculoskeletal: Positive for back pain (1 1/2 weeks now 05/16/19).    Social History   Tobacco Use  . Smoking status: Never Smoker  . Smokeless tobacco: Never Used  Substance Use Topics  . Alcohol use: No    Frequency: Never      Objective:   BP 128/70 (BP Location: Left Arm, Patient Position: Sitting, Cuff Size: Normal)   Pulse 83   Temp 98.7 F (37.1 C) (Oral)   Ht 5' 4.5" (1.638 m)   SpO2 99%   BMI 31.26 kg/m  Vitals:   05/26/19 1341  BP: 128/70  Pulse: 83  Temp: 98.7 F (37.1 C)  TempSrc: Oral  SpO2: 99%  Height: 5' 4.5" (1.638 m)     Physical Exam Constitutional:      Appearance: Normal appearance.  Cardiovascular:     Rate and Rhythm: Normal rate.  Pulmonary:     Effort: Pulmonary effort is normal.  Abdominal:  Tenderness: There is no right CVA tenderness or left CVA tenderness.  Musculoskeletal:        General: No swelling.     Lumbar back: She exhibits decreased range of motion and pain. She exhibits no tenderness, no swelling, no edema, no deformity and no laceration.  Neurological:     Mental Status: She is alert and oriented to person, place, and time. Mental status is at baseline.  Psychiatric:        Mood and Affect: Mood normal.        Behavior: Behavior normal.         Assessment & Plan    1. Acute bilateral low back pain without sciatica  Counseled on self limiting nature, may do warm heat and continue with PRN NSAIDs. Will likely resolve in several weeks.   - cyclobenzaprine (FLEXERIL) 5 MG tablet; Take 1 tablet (5 mg total) by mouth at bedtime.  Dispense: 30 tablet;  Refill: 0  The entirety of the information documented in the History of Present Illness, Review of Systems and Physical Exam were personally obtained by me. Portions of this information were initially documented by Lynford Humphrey, CMA and reviewed by me for thoroughness and accuracy.   F/u PRN    Trinna Post, PA-C  [facility]

## 2019-05-26 NOTE — Patient Instructions (Signed)
Low Back Sprain Rehab  Ask your health care provider which exercises are safe for you. Do exercises exactly as told by your health care provider and adjust them as directed. It is normal to feel mild stretching, pulling, tightness, or discomfort as you do these exercises, but you should stop right away if you feel sudden pain or your pain gets worse. Do not begin these exercises until told by your health care provider.  Stretching and range of motion exercises  These exercises warm up your muscles and joints and improve the movement and flexibility of your back. These exercises also help to relieve pain, numbness, and tingling.  Exercise A: Lumbar rotation    1. Lie on your back on a firm surface and bend your knees.  2. Straighten your arms out to your sides so each arm forms an "L" shape with a side of your body (a 90 degree angle).  3. Slowly move both of your knees to one side of your body until you feel a stretch in your lower back. Try not to let your shoulders move off of the floor.  4. Hold for __________ seconds.  5. Tense your abdominal muscles and slowly move your knees back to the starting position.  6. Repeat this exercise on the other side of your body.  Repeat __________ times. Complete this exercise __________ times a day.  Exercise B: Prone extension on elbows    1. Lie on your abdomen on a firm surface.  2. Prop yourself up on your elbows.  3. Use your arms to help lift your chest up until you feel a gentle stretch in your abdomen and your lower back.  ? This will place some of your body weight on your elbows. If this is uncomfortable, try stacking pillows under your chest.  ? Your hips should stay down, against the surface that you are lying on. Keep your hip and back muscles relaxed.  4. Hold for __________ seconds.  5. Slowly relax your upper body and return to the starting position.  Repeat __________ times. Complete this exercise __________ times a day.  Strengthening exercises  These  exercises build strength and endurance in your back. Endurance is the ability to use your muscles for a long time, even after they get tired.  Exercise C: Pelvic tilt  1. Lie on your back on a firm surface. Bend your knees and keep your feet flat.  2. Tense your abdominal muscles. Tip your pelvis up toward the ceiling and flatten your lower back into the floor.  ? To help with this exercise, you may place a small towel under your lower back and try to push your back into the towel.  3. Hold for __________ seconds.  4. Let your muscles relax completely before you repeat this exercise.  Repeat __________ times. Complete this exercise __________ times a day.  Exercise D: Alternating arm and leg raises    1. Get on your hands and knees on a firm surface. If you are on a hard floor, you may want to use padding to cushion your knees, such as an exercise mat.  2. Line up your arms and legs. Your hands should be below your shoulders, and your knees should be below your hips.  3. Lift your left leg behind you. At the same time, raise your right arm and straighten it in front of you.  ? Do not lift your leg higher than your hip.  ? Do not lift your arm   higher than your shoulder.  ? Keep your abdominal and back muscles tight.  ? Keep your hips facing the ground.  ? Do not arch your back.  ? Keep your balance carefully, and do not hold your breath.  4. Hold for __________ seconds.  5. Slowly return to the starting position and repeat with your right leg and your left arm.  Repeat __________ times. Complete this exercise __________ times a day.  Exercise E: Abdominal set with straight leg raise    1. Lie on your back on a firm surface.  2. Bend one of your knees and keep your other leg straight.  3. Tense your abdominal muscles and lift your straight leg up, 4-6 inches (10-15 cm) off the ground.  4. Keep your abdominal muscles tight and hold for __________ seconds.  ? Do not hold your breath.  ? Do not arch your back. Keep it  flat against the ground.  5. Keep your abdominal muscles tense as you slowly lower your leg back to the starting position.  6. Repeat with your other leg.  Repeat __________ times. Complete this exercise __________ times a day.  Posture and body mechanics    Body mechanics refers to the movements and positions of your body while you do your daily activities. Posture is part of body mechanics. Good posture and healthy body mechanics can help to relieve stress in your body's tissues and joints. Good posture means that your spine is in its natural S-curve position (your spine is neutral), your shoulders are pulled back slightly, and your head is not tipped forward. The following are general guidelines for applying improved posture and body mechanics to your everyday activities.  Standing    · When standing, keep your spine neutral and your feet about hip-width apart. Keep a slight bend in your knees. Your ears, shoulders, and hips should line up.  · When you do a task in which you stand in one place for a long time, place one foot up on a stable object that is 2-4 inches (5-10 cm) high, such as a footstool. This helps keep your spine neutral.  Sitting    · When sitting, keep your spine neutral and keep your feet flat on the floor. Use a footrest, if necessary, and keep your thighs parallel to the floor. Avoid rounding your shoulders, and avoid tilting your head forward.  · When working at a desk or a computer, keep your desk at a height where your hands are slightly lower than your elbows. Slide your chair under your desk so you are close enough to maintain good posture.  · When working at a computer, place your monitor at a height where you are looking straight ahead and you do not have to tilt your head forward or downward to look at the screen.  Resting    · When lying down and resting, avoid positions that are most painful for you.  · If you have pain with activities such as sitting, bending, stooping, or squatting  (flexion-based activities), lie in a position in which your body does not bend very much. For example, avoid curling up on your side with your arms and knees near your chest (fetal position).  · If you have pain with activities such as standing for a long time or reaching with your arms (extension-based activities), lie with your spine in a neutral position and bend your knees slightly. Try the following positions:  · Lying on your side with a   pillow between your knees.  · Lying on your back with a pillow under your knees.  Lifting    · When lifting objects, keep your feet at least shoulder-width apart and tighten your abdominal muscles.  · Bend your knees and hips and keep your spine neutral. It is important to lift using the strength of your legs, not your back. Do not lock your knees straight out.  · Always ask for help to lift heavy or awkward objects.  This information is not intended to replace advice given to you by your health care provider. Make sure you discuss any questions you have with your health care provider.  Document Released: 12/10/2005 Document Revised: 08/16/2016 Document Reviewed: 09/21/2015  Elsevier Interactive Patient Education © 2019 [employer]

## 2019-06-04 ENCOUNTER — Telehealth (INDEPENDENT_AMBULATORY_CARE_PROVIDER_SITE_OTHER): Payer: BLUE CROSS/BLUE SHIELD | Admitting: Physician Assistant

## 2019-06-04 DIAGNOSIS — L237 Allergic contact dermatitis due to plants, except food: Secondary | ICD-10-CM

## 2019-06-04 MED ORDER — PREDNISONE 10 MG (21) PO TBPK: ORAL_TABLET | ORAL | 0 refills | Status: DC

## 2019-06-04 MED ORDER — TRIAMCINOLONE ACETONIDE 0.1 % EX CREA: 1.0000 "application " | TOPICAL_CREAM | Freq: Two times a day (BID) | CUTANEOUS | 0 refills | Status: DC

## 2019-06-04 NOTE — Patient Instructions (Signed)
Poison Ivy Dermatitis  Poison ivy dermatitis is redness and soreness (inflammation) of the skin. It is caused by a chemical that is found on the leaves of the poison ivy plant. You may also have itching, a rash, and blisters. Symptoms often clear up in 1-2 weeks. You may get this condition by touching a poison ivy plant. You can also get it by touching something that has the chemical on it. This may include animals or objects that have come in contact with the plant. Follow these instructions at home: General instructions  Take or apply over-the-counter and prescription medicines only as told by your doctor.  If you touch poison ivy, wash your skin with soap and cold water right away.  Use hydrocortisone creams or calamine lotion as needed to help with itching.  Take oatmeal baths as needed. Use colloidal oatmeal. You can get this at a pharmacy or grocery store. Follow the instructions on the package.  Do not scratch or rub your skin.  While you have the rash, wash your clothes right after you wear them. Prevention   Know what poison ivy looks like so you can avoid it. This plant has three leaves with flowering branches on a single stem. The leaves are glossy. They have uneven edges that come to a point at the front.  If you have touched poison ivy, wash with soap and water right away. Be sure to wash under your fingernails.  When hiking or camping, wear long pants, a long-sleeved shirt, tall socks, and hiking boots. You can also use a lotion on your skin that helps to prevent contact with the chemical on the plant.  If you think that your clothes or outdoor gear came in contact with poison ivy, rinse them off with a garden hose before you bring them inside your house. Contact a doctor if:  You have open sores in the rash area.  You have more redness, swelling, or pain in the affected area.  You have redness that spreads beyond the rash area.  You have fluid, blood, or pus coming  from the affected area.  You have a fever.  You have a rash over a large area of your body.  You have a rash on your eyes, mouth, or genitals.  Your rash does not get better after a few days. Get help right away if:  Your face swells or your eyes swell shut.  You have trouble breathing.  You have trouble swallowing. This information is not intended to replace advice given to you by your health care provider. Make sure you discuss any questions you have with your health care provider. Document Released: 01/12/2011 Document Revised: 09/03/2018 Document Reviewed: 05/18/2015 Elsevier Interactive Patient Education  2019 [employer].

## 2019-06-04 NOTE — Progress Notes (Signed)
   Subjective:    Patient ID: [name] [name], female    DOB: 02-Jun-1956, 63 y.o.   MRN: [id number]  [name] [name] is a 63 y.o. female presenting on 06/04/2019 for Rash  Virtual Visit via Video Note  I connected with VELMER BROADFOOT on 06/04/19 at  1:20 PM EDT by a video enabled telemedicine application and verified that I am speaking with the correct person using two identifiers.   I discussed the limitations of evaluation and management by telemedicine and the availability of in person appointments. The patient expressed understanding and agreed to proceed.  Patient location: home Provider location: [place] office  Persons involved in the visit: patient, provider   Interactive audio and video communications were attempted, although failed due to patient's inability to connect to video. Continued visit with audio only interaction with patient agreement.  HPI  Patient reports she was outside this weekend planting flowers along the tree line of her yard. Reports she has an extremely itchy red rash on her face and ears. Has been taking benadryl without relief. No fevers, chills, nausea, vomiting.   Social History   Tobacco Use  . Smoking status: Never Smoker  . Smokeless tobacco: Never Used  Substance Use Topics  . Alcohol use: No    Frequency: Never  . Drug use: No    Review of Systems Per HPI unless specifically indicated above     Objective:    There were no vitals taken for this visit.  Wt Readings from Last 3 Encounters:  10/23/18 185 lb (83.9 kg)  09/30/18 188 lb (85.3 kg)  09/05/18 186 lb 3 oz (84.5 kg)    Physical Exam Results for orders placed or performed in visit on 10/15/18  HM COLONOSCOPY  Result Value Ref Range   [facility] Colonoscopy See Report (in chart) See Report (in chart), Patient Reported      Assessment & Plan:  1. Poison ivy dermatitis  - predniSONE (STERAPRED UNI-PAK 21 TAB) 10 MG (21) TBPK tablet; Take 6 pills  on day 1, take 5 pills on day 2 and so on until complete.  Dispense: 21 tablet; Refill: 0 - triamcinolone cream (KENALOG) 0.1 %; Apply 1 application topically 2 (two) times daily.  Dispense: 30 g; Refill: 0    Follow up plan: Return if symptoms worsen or fail to improve.  Carles Collet, PA-C [facility] 06/04/2019, 2:04 PM

## 2019-06-10 ENCOUNTER — Encounter: Payer: Self-pay | Admitting: Physician Assistant

## 2019-06-10 DIAGNOSIS — L237 Allergic contact dermatitis due to plants, except food: Secondary | ICD-10-CM

## 2019-06-10 MED ORDER — PREDNISONE 10 MG (21) PO TBPK: ORAL_TABLET | ORAL | 0 refills | Status: DC

## 2019-06-24 ENCOUNTER — Encounter: Payer: Self-pay | Admitting: Physician Assistant

## 2019-06-24 ENCOUNTER — Other Ambulatory Visit: Payer: Self-pay

## 2019-06-24 ENCOUNTER — Ambulatory Visit (INDEPENDENT_AMBULATORY_CARE_PROVIDER_SITE_OTHER): Payer: BLUE CROSS/BLUE SHIELD | Admitting: Physician Assistant

## 2019-06-24 VITALS — BP 126/75 | HR 85 | Temp 98.7°F | Resp 16 | Ht 64.0 in | Wt 192.0 lb

## 2019-06-24 DIAGNOSIS — M545 Low back pain, unspecified: Secondary | ICD-10-CM

## 2019-06-24 MED ORDER — MELOXICAM 7.5 MG PO TABS: ORAL_TABLET | ORAL | 0 refills | Status: DC

## 2019-06-24 MED ORDER — CYCLOBENZAPRINE HCL 10 MG PO TABS: 10.0000 mg | ORAL_TABLET | Freq: Two times a day (BID) | ORAL | 0 refills | Status: AC | PRN

## 2019-06-24 NOTE — Progress Notes (Signed)
Patient: [name] [name] Female    DOB: [birth year]   63 y.o.   MRN: [id number] Visit Date: 07/14/2019  Today's Provider: Trinna Post, PA-C   Chief Complaint  Patient presents with  . Back Pain   Subjective:     HPI Patient here today c/o recurrent back pain, patient reports pain is worsening. Patient reports pain is worse with activities. Patient reports taking Motrin, and flexeril. Had back strain previously on 05/26/2019. She finds it difficult to stand from seated position and bend over. Pain does not shoot down legs. Does not have weakness or numbness in lower extremities. Denies incontinence.    Allergies  Allergen Reactions  . Oxycodone-Acetaminophen Other (See Comments)    Made her feel like "she was losing her mind". Unsure if the reaction was due to percocet or stress at the time.     Current Outpatient Medications:  .  calcium-vitamin D (OSCAL WITH D) 500-200 MG-UNIT tablet, Take 1 tablet by mouth See admin instructions. Take 2 tablets by mouth daily in the morning and 1 tablet in the evening, Disp: , Rfl:  .  dicyclomine (BENTYL) 10 MG capsule, Take 1 capsule (10 mg total) by mouth 3 (three) times daily before meals., Disp: 90 capsule, Rfl: 0 .  hydrochlorothiazide (HYDRODIURIL) 12.5 MG tablet, TAKE ONE TABLET EVERY DAY WITH LOSARTAN 50MG , Disp: 90 tablet, Rfl: 1 .  losartan (COZAAR) 50 MG tablet, TAKE ONE TABLET EVERY DAY WITH HYDROCHLOROTHIAZIDE 12.5MG  TAB, Disp: 90 tablet, Rfl: 1 .  Multiple Vitamin (MULTIVITAMIN) capsule, Take 1 capsule by mouth daily., Disp: , Rfl:  .  niacin 500 MG tablet, Take 500 mg by mouth 2 (two) times daily with a meal. , Disp: , Rfl:  .  sertraline (ZOLOFT) 50 MG tablet, Take 50 mg by mouth every morning. , Disp: , Rfl:  .  traZODone (DESYREL) 100 MG tablet, Take 100 mg by mouth at bedtime as needed for sleep. , Disp: , Rfl:  .  triamcinolone cream (KENALOG) 0.1 %, Apply 1 application topically 2 (two) times daily., Disp: 30 g,  Rfl: 0 .  cyclobenzaprine (FLEXERIL) 10 MG tablet, Take 1 tablet (10 mg total) by mouth 2 (two) times daily as needed for muscle spasms., Disp: 30 tablet, Rfl: 0 .  meloxicam (MOBIC) 7.5 MG tablet, Take 1-2 tablets daily., Disp: 30 tablet, Rfl: 0  Review of Systems  Constitutional: Negative.   Cardiovascular: Negative.   Musculoskeletal: Positive for back pain and myalgias.    Social History   Tobacco Use  . Smoking status: Never Smoker  . Smokeless tobacco: Never Used  Substance Use Topics  . Alcohol use: No    Frequency: Never      Objective:   BP 126/75 (BP Location: Left Arm, Patient Position: Sitting, Cuff Size: Normal)   Pulse 85   Temp 98.7 F (37.1 C) (Oral)   Resp 16   Ht 5\' 4"  (1.626 m)   Wt 192 lb (87.1 kg)   SpO2 97%   BMI 32.96 kg/m  Vitals:   06/24/19 1604  BP: 126/75  Pulse: 85  Resp: 16  Temp: 98.7 F (37.1 C)  TempSrc: Oral  SpO2: 97%  Weight: 192 lb (87.1 kg)  Height: 5\' 4"  (1.626 m)     Physical Exam Constitutional:      Appearance: Normal appearance.  Musculoskeletal:     Lumbar back: She exhibits decreased range of motion and pain. She exhibits no tenderness.  Comments: 5/5 bilateral lower extremity strength  Neurological:     Mental Status: She is alert.  Psychiatric:        Mood and Affect: Mood normal.        Behavior: Behavior normal.      No results found for any visits on 06/24/19.     Assessment & Plan    1. Acute bilateral low back pain without sciatica  We will do trial of PT for worsening back pain and place xray for her to get since pain has persisted > 6 weeks.   - DG Lumbar Spine Complete; Future - cyclobenzaprine (FLEXERIL) 10 MG tablet; Take 1 tablet (10 mg total) by mouth 2 (two) times daily as needed for muscle spasms.  Dispense: 30 tablet; Refill: 0 - Ambulatory referral to Physical Therapy - meloxicam (MOBIC) 7.5 MG tablet; Take 1-2 tablets daily.  Dispense: 30 tablet; Refill: 0  The entirety of the  information documented in the History of Present Illness, Review of Systems and Physical Exam were personally obtained by me. Portions of this information were initially documented by [name] [name], CMA and reviewed by me for thoroughness and accuracy.   F/u PRN.     Trinna Post, PA-C  [facility]

## 2019-06-24 NOTE — Patient Instructions (Signed)

## 2019-07-13 ENCOUNTER — Ambulatory Visit: Payer: BLUE CROSS/BLUE SHIELD

## 2019-07-15 ENCOUNTER — Ambulatory Visit: Payer: BLUE CROSS/BLUE SHIELD

## 2019-07-20 ENCOUNTER — Ambulatory Visit: Payer: BLUE CROSS/BLUE SHIELD

## 2019-08-07 ENCOUNTER — Encounter (INDEPENDENT_AMBULATORY_CARE_PROVIDER_SITE_OTHER): Payer: BLUE CROSS/BLUE SHIELD | Admitting: Physician Assistant

## 2019-08-07 DIAGNOSIS — H109 Unspecified conjunctivitis: Secondary | ICD-10-CM | POA: Diagnosis not present

## 2019-08-07 MED ORDER — OFLOXACIN 0.3 % OP SOLN: 1.0000 [drp] | Freq: Four times a day (QID) | OPHTHALMIC | 0 refills | Status: AC

## 2019-08-07 NOTE — Telephone Encounter (Signed)
Patient with 1-2 day of red eyes, in contact with her daughter with pink eye. She has thick yellow discharge that continues throughout the day and reports red eyes. Her eyes itch and burn. She denies injury to her eyes. She denies runny nose, cough, or sore throat. She has some light sensitivity. Will send her in ocuflox 0.3% eye drops 1 drop each eye four times a day.

## 2019-10-09 DIAGNOSIS — K589 Irritable bowel syndrome without diarrhea: Secondary | ICD-10-CM | POA: Insufficient documentation

## 2019-10-26 ENCOUNTER — Other Ambulatory Visit: Payer: Self-pay | Admitting: Physician Assistant

## 2019-10-26 DIAGNOSIS — Z1231 Encounter for screening mammogram for malignant neoplasm of breast: Secondary | ICD-10-CM

## 2019-11-05 ENCOUNTER — Encounter: Payer: Self-pay | Admitting: Physician Assistant

## 2019-11-05 ENCOUNTER — Ambulatory Visit (INDEPENDENT_AMBULATORY_CARE_PROVIDER_SITE_OTHER): Payer: BLUE CROSS/BLUE SHIELD | Admitting: Physician Assistant

## 2019-11-05 ENCOUNTER — Other Ambulatory Visit: Payer: Self-pay

## 2019-11-05 VITALS — BP 122/72 | Temp 96.9°F

## 2019-11-05 DIAGNOSIS — R7303 Prediabetes: Secondary | ICD-10-CM | POA: Diagnosis not present

## 2019-11-05 DIAGNOSIS — Z0000 Encounter for general adult medical examination without abnormal findings: Secondary | ICD-10-CM | POA: Diagnosis not present

## 2019-11-05 DIAGNOSIS — Z124 Encounter for screening for malignant neoplasm of cervix: Secondary | ICD-10-CM

## 2019-11-05 DIAGNOSIS — Z23 Encounter for immunization: Secondary | ICD-10-CM

## 2019-11-05 DIAGNOSIS — I10 Essential (primary) hypertension: Secondary | ICD-10-CM | POA: Diagnosis not present

## 2019-11-05 DIAGNOSIS — Z1231 Encounter for screening mammogram for malignant neoplasm of breast: Secondary | ICD-10-CM | POA: Diagnosis not present

## 2019-11-05 DIAGNOSIS — R102 Pelvic and perineal pain: Secondary | ICD-10-CM | POA: Diagnosis not present

## 2019-11-05 DIAGNOSIS — M8580 Other specified disorders of bone density and structure, unspecified site: Secondary | ICD-10-CM

## 2019-11-05 MED ORDER — DICYCLOMINE HCL 10 MG PO CAPS: 10.0000 mg | ORAL_CAPSULE | Freq: Three times a day (TID) | ORAL | 0 refills | Status: DC

## 2019-11-05 MED ORDER — HYDROCHLOROTHIAZIDE 12.5 MG PO TABS: ORAL_TABLET | ORAL | 1 refills | Status: DC

## 2019-11-05 MED ORDER — LOSARTAN POTASSIUM 50 MG PO TABS: ORAL_TABLET | ORAL | 1 refills | Status: DC

## 2019-11-05 NOTE — Patient Instructions (Signed)
Health Maintenance, Female Adopting a healthy lifestyle and getting preventive care are important in promoting health and wellness. Ask your health care provider about:  The right schedule for you to have regular tests and exams.  Things you can do on your own to prevent diseases and keep yourself healthy. What should I know about diet, weight, and exercise? Eat a healthy diet   Eat a diet that includes plenty of vegetables, fruits, low-fat dairy products, and lean protein.  Do not eat a lot of foods that are high in solid fats, added sugars, or sodium. Maintain a healthy weight Body mass index (BMI) is used to identify weight problems. It estimates body fat based on height and weight. Your health care provider can help determine your BMI and help you achieve or maintain a healthy weight. Get regular exercise Get regular exercise. This is one of the most important things you can do for your health. Most adults should:  Exercise for at least 150 minutes each week. The exercise should increase your heart rate and make you sweat (moderate-intensity exercise).  Do strengthening exercises at least twice a week. This is in addition to the moderate-intensity exercise.  Spend less time sitting. Even light physical activity can be beneficial. Watch cholesterol and blood lipids Have your blood tested for lipids and cholesterol at 64 years of age, then have this test every 5 years. Have your cholesterol levels checked more often if:  Your lipid or cholesterol levels are high.  You are older than 64 years of age.  You are at high risk for heart disease. What should I know about cancer screening? Depending on your health history and family history, you may need to have cancer screening at various ages. This may include screening for:  Breast cancer.  Cervical cancer.  Colorectal cancer.  Skin cancer.  Lung cancer. What should I know about heart disease, diabetes, and high blood  pressure? Blood pressure and heart disease  High blood pressure causes heart disease and increases the risk of stroke. This is more likely to develop in people who have high blood pressure readings, are of African descent, or are overweight.  Have your blood pressure checked: ? Every 3-5 years if you are 18-39 years of age. ? Every year if you are 40 years old or older. Diabetes Have regular diabetes screenings. This checks your fasting blood sugar level. Have the screening done:  Once every three years after age 40 if you are at a normal weight and have a low risk for diabetes.  More often and at a younger age if you are overweight or have a high risk for diabetes. What should I know about preventing infection? Hepatitis B If you have a higher risk for hepatitis B, you should be screened for this virus. Talk with your health care provider to find out if you are at risk for hepatitis B infection. Hepatitis C Testing is recommended for:  Everyone born from 1945 through 1965.  Anyone with known risk factors for hepatitis C. Sexually transmitted infections (STIs)  Get screened for STIs, including gonorrhea and chlamydia, if: ? You are sexually active and are younger than 64 years of age. ? You are older than 64 years of age and your health care provider tells you that you are at risk for this type of infection. ? Your sexual activity has changed since you were last screened, and you are at increased risk for chlamydia or gonorrhea. Ask your health care provider if   you are at risk.  Ask your health care provider about whether you are at high risk for HIV. Your health care provider may recommend a prescription medicine to help prevent HIV infection. If you choose to take medicine to prevent HIV, you should first get tested for HIV. You should then be tested every 3 months for as long as you are taking the medicine. Pregnancy  If you are about to stop having your period (premenopausal) and  you may become pregnant, seek counseling before you get pregnant.  Take 400 to 800 micrograms (mcg) of folic acid every day if you become pregnant.  Ask for birth control (contraception) if you want to prevent pregnancy. Osteoporosis and menopause Osteoporosis is a disease in which the bones lose minerals and strength with aging. This can result in bone fractures. If you are 65 years old or older, or if you are at risk for osteoporosis and fractures, ask your health care provider if you should:  Be screened for bone loss.  Take a calcium or vitamin D supplement to lower your risk of fractures.  Be given hormone replacement therapy (HRT) to treat symptoms of menopause. Follow these instructions at home: Lifestyle  Do not use any products that contain nicotine or tobacco, such as cigarettes, e-cigarettes, and chewing tobacco. If you need help quitting, ask your health care provider.  Do not use street drugs.  Do not share needles.  Ask your health care provider for help if you need support or information about quitting drugs. Alcohol use  Do not drink alcohol if: ? Your health care provider tells you not to drink. ? You are pregnant, may be pregnant, or are planning to become pregnant.  If you drink alcohol: ? Limit how much you use to 0-1 drink a day. ? Limit intake if you are breastfeeding.  Be aware of how much alcohol is in your drink. In the [place] one drink equals one 12 oz bottle of beer (355 mL), one 5 oz glass of wine (148 mL), or one 1 oz glass of hard liquor (44 mL). General instructions  Schedule regular health, dental, and eye exams.  Stay current with your vaccines.  Tell your health care provider if: ? You often feel depressed. ? You have ever been abused or do not feel safe at home. Summary  Adopting a healthy lifestyle and getting preventive care are important in promoting health and wellness.  Follow your health care provider's instructions about healthy  diet, exercising, and getting tested or screened for diseases.  Follow your health care provider's instructions on monitoring your cholesterol and blood pressure. This information is not intended to replace advice given to you by your health care provider. Make sure you discuss any questions you have with your health care provider. Document Released: 06/25/2011 Document Revised: 12/03/2018 Document Reviewed: 12/03/2018 Elsevier Patient Education  2020 Elsevier Inc.  

## 2019-11-05 NOTE — Progress Notes (Signed)
Patient: [name] [name], Female    DOB: [birth year], 63 y.o.   MRN: [id number] Visit Date: 11/05/2019  Today's Provider: Trinna Post, PA-C   Chief Complaint  Patient presents with  . Annual Exam   Subjective:    Annual physical exam [name] [name] is a 63 y.o. female who presents today for health maintenance and complete physical. She feels well. She reports exercising none. She reports she is sleeping fairly well.  HTN: She continues to take losartan 50 mg daily and HCTZ 12.5 mg daily without issue. She is due for labwork today.   BP Readings from Last 3 Encounters:  11/05/19 122/72  06/24/19 126/75  05/26/19 128/70   Last pap:09/13/2016 WNL, records not available, done with Dr. Cherylann Banas Last mammogram:01/22/2018 Last colonoscopy:11/23/2009, scheduled [facility]/2020 [facility]  -----------------------------------------------------------------  Review of Systems  Constitutional: Negative.   HENT: Positive for tinnitus.   Eyes: Negative.   Respiratory: Negative.   Cardiovascular: Negative.   Gastrointestinal: Negative.   Endocrine: Negative.   Genitourinary: Negative.   Musculoskeletal: Negative.   Skin: Negative.   Allergic/Immunologic: Negative.   Neurological: Negative.   Hematological: Negative.   Psychiatric/Behavioral: Negative.     Social History She  reports that she has never smoked. She has never used smokeless tobacco. She reports that she does not drink alcohol or use drugs. Social History   Socioeconomic History  . Marital status: Married    Spouse name: Clair Gulling  . Number of children: 6  . Years of education: Not on file  . Highest education level: Not on file  Occupational History    Comment: retired  Scientific laboratory technician  . Financial resource strain: Not on file  . Food insecurity    Worry: Not on file    Inability: Not on file  . Transportation needs    Medical: Not on file    Non-medical: Not on file  Tobacco Use  . Smoking status:  Never Smoker  . Smokeless tobacco: Never Used  Substance and Sexual Activity  . Alcohol use: No    Frequency: Never  . Drug use: No  . Sexual activity: Not on file  Lifestyle  . Physical activity    Days per week: Not on file    Minutes per session: Not on file  . Stress: Not on file  Relationships  . Social Herbalist on phone: Not on file    Gets together: Not on file    Attends religious service: Not on file    Active member of club or organization: Not on file    Attends meetings of clubs or organizations: Not on file    Relationship status: Not on file  Other Topics Concern  . Not on file  Social History Narrative  . Not on file    Patient Active Problem List   Diagnosis Date Noted  . Prediabetes 04/01/2019  . Depression 04/01/2019  . Hypertension 02/19/2017  . Hypertriglyceridemia 06/25/2016  . Osteopenia 06/25/2016  . Buzzing in ear 06/25/2016  . Avitaminosis D 06/25/2016  . Cannot sleep 03/28/2007    Past Surgical History:  Procedure Laterality Date  . ANKLE FRACTURE SURGERY Left 2012  . CHOLECYSTECTOMY    . CYSTOSCOPY/URETEROSCOPY/HOLMIUM LASER/STENT PLACEMENT Left 09/05/2018   Procedure: CYSTOSCOPY/URETEROSCOPY/HOLMIUM LASER/STENT PLACEMENT;  Surgeon: Billey Co, MD;  Location: [facility] ORS;  Service: Urology;  Laterality: Left;  . EXTRACORPOREAL SHOCK WAVE LITHOTRIPSY Left 08/21/2018   Procedure: EXTRACORPOREAL SHOCK WAVE  LITHOTRIPSY (ESWL);  Surgeon: Abbie Sons, MD;  Location: [facility] ORS;  Service: Urology;  Laterality: Left;  . FOOT SURGERY Right    neuroma removed  . LITHOTRIPSY  1990'S  . TUBAL LIGATION    . VEIN LIGATION AND STRIPPING Left     Family History  Family Status  Relation Name Status  . Mat Aunt  (Not Specified)  . Mother  Deceased  . Father  Deceased       lung cancer  . Sister  Alive  . Brother  Alive  . Sister  Deceased at age 27       respiratory   Her family history includes Alzheimer's disease in her  mother; Breast cancer (age of onset: 11) in her maternal aunt; Cancer in her father; Kidney disease in her mother.     Allergies  Allergen Reactions  . Oxycodone-Acetaminophen Other (See Comments)    Made her feel like "she was losing her mind". Unsure if the reaction was due to percocet or stress at the time.    Previous Medications   CALCIUM-VITAMIN D (OSCAL WITH D) 500-200 MG-UNIT TABLET    Take 1 tablet by mouth See admin instructions. Take 2 tablets by mouth daily in the morning and 1 tablet in the evening   DICYCLOMINE (BENTYL) 10 MG CAPSULE    Take 1 capsule (10 mg total) by mouth 3 (three) times daily before meals.   HYDROCHLOROTHIAZIDE (HYDRODIURIL) 12.5 MG TABLET    TAKE ONE TABLET EVERY DAY WITH LOSARTAN 50MG    LOSARTAN (COZAAR) 50 MG TABLET    TAKE ONE TABLET EVERY DAY WITH HYDROCHLOROTHIAZIDE 12.5MG  TAB   MELOXICAM (MOBIC) 7.5 MG TABLET    Take 1-2 tablets daily.   MULTIPLE VITAMIN (MULTIVITAMIN) CAPSULE    Take 1 capsule by mouth daily.   NIACIN 500 MG TABLET    Take 500 mg by mouth 2 (two) times daily with a meal.    SERTRALINE (ZOLOFT) 50 MG TABLET    Take 50 mg by mouth every morning.    TRAZODONE (DESYREL) 100 MG TABLET    Take 100 mg by mouth at bedtime as needed for sleep.    TRIAMCINOLONE CREAM (KENALOG) 0.1 %    Apply 1 application topically 2 (two) times daily.    Patient Care Team: Paulene Floor as PCP - General (Physician Assistant)      Objective:   Vitals: BP 122/72 (BP Location: Right Arm, Patient Position: Sitting, Cuff Size: Normal)   Temp (!) 96.9 F (36.1 C) (Temporal)    Physical Exam Exam conducted with a chaperone present.  Constitutional:      Appearance: Normal appearance.  Cardiovascular:     Rate and Rhythm: Normal rate and regular rhythm.     Pulses: Normal pulses.     Heart sounds: Normal heart sounds.  Pulmonary:     Effort: Pulmonary effort is normal.     Breath sounds: Normal breath sounds.  Abdominal:     General: Bowel  sounds are normal.     Palpations: Abdomen is soft.  Genitourinary:    Cervix: No discharge.  Skin:    General: Skin is warm and dry.  Neurological:     Mental Status: She is alert and oriented to person, place, and time. Mental status is at baseline.  Psychiatric:        Mood and Affect: Mood normal.        Behavior: Behavior normal.      Depression Screen  PHQ 2/9 Scores 11/05/2019 06/24/2019 05/26/2019 09/30/2018  PHQ - 2 Score 0 0 0 0  PHQ- 9 Score 2 0 - 2  Exception Documentation - Other- indicate reason in comment box - -      Assessment & Plan:     Routine Health Maintenance and Physical Exam  Exercise Activities and Dietary recommendations Goals   None     Immunization History  Administered Date(s) Administered  . Influenza-Unspecified 09/27/2018  . Td 09/30/2018  . Tdap 09/30/2018  . Zoster 06/18/2011    Health Maintenance  Topic Date Due  . INFLUENZA VACCINE  07/25/2019  . PAP SMEAR-Modifier  09/14/2019  . COLONOSCOPY  11/24/2019  . MAMMOGRAM  01/23/2020  . TETANUS/TDAP  09/30/2028  . Hepatitis C Screening  Completed  . HIV Screening  Completed     Discussed health benefits of physical activity, and encouraged her to engage in regular exercise appropriate for her age and condition.    1. Annual physical exam   2. Pelvic cramping  Having issues with IBS and plans to follow up with [facility].   - dicyclomine (BENTYL) 10 MG capsule; Take 1 capsule (10 mg total) by mouth 3 (three) times daily before meals.  Dispense: 90 capsule; Refill: 0  3. Essential hypertension  - losartan (COZAAR) 50 MG tablet; TAKE ONE TABLET EVERY DAY WITH HYDROCHLOROTHIAZIDE 12.5MG  TAB  Dispense: 90 tablet; Refill: 1 - Comprehensive Metabolic Panel (CMET) - Lipid Profile  4. Encounter for screening mammogram for malignant neoplasm of breast  - MM Digital Screening; Future  5. Prediabetes  - HgB A1c  6. Cervical cancer screening  - Pap IG w/ reflex to HPV when  ASC-U  7. Osteopenia, unspecified location  - DG BONE DENSITY (DXA); Future  8. Need for influenza vaccination  - Flu Vaccine QUAD 6+ mos PF IM (Fluarix Quad PF)  The entirety of the information documented in the History of Present Illness, Review of Systems and Physical Exam were personally obtained by me. Portions of this information were initially documented by [facility], CMA and reviewed by me for thoroughness and accuracy.   F/u 6 months HTN --------------------------------------------------------------------

## 2019-11-06 LAB — COMPREHENSIVE METABOLIC PANEL
ALT: 16 IU/L (ref 0–32)
AST: 25 IU/L (ref 0–40)
Albumin/Globulin Ratio: 1.7 (ref 1.2–2.2)
Albumin: 4.1 g/dL (ref 3.8–4.8)
Alkaline Phosphatase: 78 IU/L (ref 39–117)
BUN/Creatinine Ratio: 15 (ref 12–28)
BUN: 14 mg/dL (ref 8–27)
Bilirubin Total: 0.4 mg/dL (ref 0.0–1.2)
CO2: 20 mmol/L (ref 20–29)
Calcium: 9.7 mg/dL (ref 8.7–10.3)
Chloride: 106 mmol/L (ref 96–106)
Creatinine, Ser: 0.92 mg/dL (ref 0.57–1.00)
GFR calc Af Amer: 76 mL/min/{1.73_m2} (ref >59)
GFR calc non Af Amer: 66 mL/min/{1.73_m2} (ref >59)
Globulin, Total: 2.4 g/dL (ref 1.5–4.5)
Glucose: 92 mg/dL (ref 65–99)
Potassium: 3.9 mmol/L (ref 3.5–5.2)
Sodium: 140 mmol/L (ref 134–144)
Total Protein: 6.5 g/dL (ref 6.0–8.5)

## 2019-11-06 LAB — LIPID PANEL
Chol/HDL Ratio: 3.7 ratio (ref 0.0–4.4)
Cholesterol, Total: 194 mg/dL (ref 100–199)
HDL: 53 mg/dL (ref >39)
LDL Chol Calc (NIH): 123 mg/dL — ABNORMAL HIGH (ref 0–99)
Triglycerides: 102 mg/dL (ref 0–149)
VLDL Cholesterol Cal: 18 mg/dL (ref 5–40)

## 2019-11-06 LAB — HEMOGLOBIN A1C
Est. average glucose Bld gHb Est-mCnc: 120 mg/dL
Hgb A1c MFr Bld: 5.8 % — ABNORMAL HIGH (ref 4.8–5.6)

## 2019-11-12 LAB — PAP IG W/ RFLX HPV ASCU: PAP Smear Comment: 0

## 2019-11-13 ENCOUNTER — Encounter: Payer: Self-pay | Admitting: Physician Assistant

## 2020-01-28 ENCOUNTER — Ambulatory Visit: Admit: 2020-01-28 | Payer: BLUE CROSS/BLUE SHIELD | Admitting: Internal Medicine

## 2020-01-28 SURGERY — COLONOSCOPY WITH PROPOFOL
Anesthesia: General

## 2020-02-24 ENCOUNTER — Ambulatory Visit
Admission: RE | Admit: 2020-02-24 | Discharge: 2020-02-24 | Disposition: A | Discharge status: Home / Self Care | Payer: 59 | Source: Ambulatory Visit | Attending: Physician Assistant | Admitting: Physician Assistant

## 2020-02-24 DIAGNOSIS — Z1231 Encounter for screening mammogram for malignant neoplasm of breast: Secondary | ICD-10-CM | POA: Insufficient documentation

## 2020-02-24 DIAGNOSIS — M8580 Other specified disorders of bone density and structure, unspecified site: Secondary | ICD-10-CM | POA: Insufficient documentation

## 2020-05-02 ENCOUNTER — Other Ambulatory Visit
Admission: RE | Admit: 2020-05-02 | Discharge: 2020-05-02 | Disposition: A | Discharge status: Home / Self Care | Payer: 59 | Source: Ambulatory Visit | Attending: Internal Medicine | Admitting: Internal Medicine

## 2020-05-02 DIAGNOSIS — Z20822 Contact with and (suspected) exposure to covid-19: Secondary | ICD-10-CM | POA: Insufficient documentation

## 2020-05-02 DIAGNOSIS — Z01812 Encounter for preprocedural laboratory examination: Secondary | ICD-10-CM | POA: Diagnosis present

## 2020-05-02 LAB — SARS CORONAVIRUS 2 (TAT 6-24 HRS): SARS Coronavirus 2: NEGATIVE

## 2020-05-04 ENCOUNTER — Ambulatory Visit: Payer: Self-pay | Admitting: Physician Assistant

## 2020-05-05 ENCOUNTER — Ambulatory Visit: Payer: 59 | Admitting: Anesthesiology

## 2020-05-05 ENCOUNTER — Encounter
Admission: RE | Disposition: A | Discharge status: Home / Self Care | Payer: Self-pay | Source: Home / Self Care | Attending: Internal Medicine

## 2020-05-05 ENCOUNTER — Encounter: Payer: Self-pay | Admitting: Internal Medicine

## 2020-05-05 ENCOUNTER — Ambulatory Visit
Admission: RE | Admit: 2020-05-05 | Discharge: 2020-05-05 | Disposition: A | Discharge status: Home / Self Care | Payer: 59 | Attending: Internal Medicine | Admitting: Internal Medicine

## 2020-05-05 DIAGNOSIS — M1990 Unspecified osteoarthritis, unspecified site: Secondary | ICD-10-CM | POA: Insufficient documentation

## 2020-05-05 DIAGNOSIS — K219 Gastro-esophageal reflux disease without esophagitis: Secondary | ICD-10-CM | POA: Insufficient documentation

## 2020-05-05 DIAGNOSIS — M8580 Other specified disorders of bone density and structure, unspecified site: Secondary | ICD-10-CM | POA: Diagnosis not present

## 2020-05-05 DIAGNOSIS — Z885 Allergy status to narcotic agent status: Secondary | ICD-10-CM | POA: Insufficient documentation

## 2020-05-05 DIAGNOSIS — F419 Anxiety disorder, unspecified: Secondary | ICD-10-CM | POA: Insufficient documentation

## 2020-05-05 DIAGNOSIS — I10 Essential (primary) hypertension: Secondary | ICD-10-CM | POA: Insufficient documentation

## 2020-05-05 DIAGNOSIS — K640 First degree hemorrhoids: Secondary | ICD-10-CM | POA: Insufficient documentation

## 2020-05-05 DIAGNOSIS — Z1211 Encounter for screening for malignant neoplasm of colon: Secondary | ICD-10-CM | POA: Diagnosis not present

## 2020-05-05 DIAGNOSIS — E781 Pure hyperglyceridemia: Secondary | ICD-10-CM | POA: Insufficient documentation

## 2020-05-05 DIAGNOSIS — Z79899 Other long term (current) drug therapy: Secondary | ICD-10-CM | POA: Insufficient documentation

## 2020-05-05 HISTORY — PX: COLONOSCOPY WITH PROPOFOL: SHX5780

## 2020-05-05 LAB — HM COLONOSCOPY

## 2020-05-05 SURGERY — COLONOSCOPY WITH PROPOFOL
Anesthesia: General

## 2020-05-05 MED ORDER — MIDAZOLAM HCL 2 MG/2ML IJ SOLN
INTRAMUSCULAR | Status: AC
  Filled 2020-05-05: qty 2

## 2020-05-05 MED ORDER — ONDANSETRON HCL 4 MG/2ML IJ SOLN
INTRAMUSCULAR | Status: DC | PRN
  Administered 2020-05-05: 4 mg via INTRAVENOUS

## 2020-05-05 MED ORDER — SODIUM CHLORIDE 0.9 % IV SOLN: INTRAVENOUS | Status: DC

## 2020-05-05 MED ORDER — FENTANYL CITRATE (PF) 100 MCG/2ML IJ SOLN
INTRAMUSCULAR | Status: AC
  Filled 2020-05-05: qty 2

## 2020-05-05 MED ORDER — PROPOFOL 500 MG/50ML IV EMUL
INTRAVENOUS | Status: DC | PRN
  Administered 2020-05-05: 150 ug/kg/min via INTRAVENOUS

## 2020-05-05 MED ORDER — PROPOFOL 500 MG/50ML IV EMUL
INTRAVENOUS | Status: AC
  Filled 2020-05-05: qty 50

## 2020-05-05 MED ORDER — ONDANSETRON HCL 4 MG/2ML IJ SOLN
INTRAMUSCULAR | Status: AC
  Filled 2020-05-05: qty 2

## 2020-05-05 NOTE — Interval H&P Note (Signed)
History and Physical Interval Note:  05/05/2020 10:51 AM  [name] [name]  has presented today for surgery, with the diagnosis of SCREENING.  The various methods of treatment have been discussed with the patient and family. After consideration of risks, benefits and other options for treatment, the patient has consented to  Procedure(s): COLONOSCOPY WITH PROPOFOL (N/A) as a surgical intervention.  The patient's history has been reviewed, patient examined, no change in status, stable for surgery.  I have reviewed the patient's chart and labs.  Questions were answered to the patient's satisfaction.     [place], [place]

## 2020-05-05 NOTE — Anesthesia Preprocedure Evaluation (Addendum)
Anesthesia Evaluation  Patient identified by MRN, date of birth, ID band Patient awake    Reviewed: Allergy & Precautions, H&P , NPO status , reviewed documented beta blocker date and time   History of Anesthesia Complications (+) PONV and history of anesthetic complications  Airway Mallampati: II  TM Distance: <3 FB Neck ROM: full    Dental  (+) Teeth Intact   Pulmonary    Pulmonary exam normal        Cardiovascular hypertension, Normal cardiovascular exam     Neuro/Psych PSYCHIATRIC DISORDERS Anxiety Depression    GI/Hepatic GERD  Controlled,  Endo/Other    Renal/GU      Musculoskeletal  (+) Arthritis ,   Abdominal   Peds  Hematology   Anesthesia Other Findings Past Medical History: No date: Anxiety     Comment:  not an issue anymore. this was due to broken ankle No date: Arthritis     Comment:  BACK No date: Complication of anesthesia     Comment:  SLOW TO WAKE UP X 1 WITH ANKLE SURGERY No date: GERD (gastroesophageal reflux disease)     Comment:  OCC-NO MEDS No date: History of kidney stones 02/19/2017: Hypertension 06/25/2016: Hypertriglyceridemia 06/25/2016: Osteopenia No date: PONV (postoperative nausea and vomiting)  Past Surgical History: 2012: ANKLE FRACTURE SURGERY; Left No date: CHOLECYSTECTOMY 09/05/2018: CYSTOSCOPY/URETEROSCOPY/HOLMIUM LASER/STENT PLACEMENT; Left     Comment:  Procedure: CYSTOSCOPY/URETEROSCOPY/HOLMIUM LASER/STENT               PLACEMENT;  Surgeon: Billey Co, MD;  Location:               [facility] ORS;  Service: Urology;  Laterality: Left; 08/21/2018: EXTRACORPOREAL SHOCK WAVE LITHOTRIPSY; Left     Comment:  Procedure: EXTRACORPOREAL SHOCK WAVE LITHOTRIPSY (ESWL);              Surgeon: Abbie Sons, MD;  Location: [facility] ORS;                Service: Urology;  Laterality: Left; No date: FOOT SURGERY; Right     Comment:  neuroma removed 1990'S: LITHOTRIPSY No date: TUBAL  LIGATION No date: [place]; Left  BMI    Body Mass Index: 32.11 kg/m      Reproductive/Obstetrics                            Anesthesia Physical Anesthesia Plan  ASA: II  Anesthesia Plan: General   Post-op Pain Management:    Induction: Intravenous  PONV Risk Score and Plan: Treatment may vary due to age or medical condition and TIVA  Airway Management Planned: Nasal Cannula and Natural Airway  Additional Equipment:   Intra-op Plan:   Post-operative Plan:   Informed Consent: I have reviewed the patients History and Physical, chart, labs and discussed the procedure including the risks, benefits and alternatives for the proposed anesthesia with the patient or authorized representative who has indicated his/her understanding and acceptance.     Dental Advisory Given  Plan Discussed with: CRNA  Anesthesia Plan Comments:         Anesthesia Quick Evaluation

## 2020-05-05 NOTE — H&P (Signed)
Outpatient short stay form Pre-procedure 05/05/2020 10:51 AM [name] [name], M.D.  Primary Physician: Carles Collet, PA-C  Reason for visit:  Colon cancer screening  History of present illness:  Patient presents for colonoscopy for colon cancer screening. The patient denies complaints of abdominal pain, significant change in bowel habits, or rectal bleeding.      Current Facility-Administered Medications:  .  0.9 %  sodium chloride infusion, , Intravenous, Continuous, Gabrille Kilbride, Benay Pike, MD  Medications Prior to Admission  Medication Sig Dispense Refill Last Dose  . calcium-vitamin D (OSCAL WITH D) 500-200 MG-UNIT tablet Take 1 tablet by mouth See admin instructions. Take 2 tablets by mouth daily in the morning and 1 tablet in the evening   Past Month at Unknown time  . hydrochlorothiazide (HYDRODIURIL) 12.5 MG tablet TAKE ONE TABLET EVERY DAY WITH LOSARTAN 50MG  90 tablet 1 05/05/2020 at 0430  . losartan (COZAAR) 50 MG tablet TAKE ONE TABLET EVERY DAY WITH HYDROCHLOROTHIAZIDE 12.5MG  TAB 90 tablet 1 05/05/2020 at 0430  . Multiple Vitamin (MULTIVITAMIN) capsule Take 1 capsule by mouth daily.   Past Month at Unknown time  . niacin 500 MG tablet Take 500 mg by mouth 2 (two) times daily with a meal.    05/04/2020 at 0500  . sertraline (ZOLOFT) 50 MG tablet Take 50 mg by mouth every morning.    05/04/2020 at 0500  . traZODone (DESYREL) 100 MG tablet Take 100 mg by mouth at bedtime as needed for sleep.    Past Month at Unknown time  . dicyclomine (BENTYL) 10 MG capsule Take 1 capsule (10 mg total) by mouth 3 (three) times daily before meals. 90 capsule 0   . meloxicam (MOBIC) 7.5 MG tablet Take 1-2 tablets daily. (Patient not taking: Reported on 11/05/2019) 30 tablet 0 Not Taking at Unknown time  . triamcinolone cream (KENALOG) 0.1 % Apply 1 application topically 2 (two) times daily. (Patient not taking: Reported on 11/05/2019) 30 g 0 Completed Course at Unknown time     Allergies  Allergen  Reactions  . Oxycodone-Acetaminophen Other (See Comments)    Made her feel like "she was losing her mind". Unsure if the reaction was due to percocet or stress at the time.     Past Medical History:  Diagnosis Date  . Anxiety    not an issue anymore. this was due to broken ankle  . Arthritis    BACK  . Complication of anesthesia    SLOW TO WAKE UP X 1 WITH ANKLE SURGERY  . GERD (gastroesophageal reflux disease)    OCC-NO MEDS  . History of kidney stones   . Hypertension 02/19/2017  . Hypertriglyceridemia 06/25/2016  . Osteopenia 06/25/2016  . PONV (postoperative nausea and vomiting)     Review of systems:  Otherwise negative.    Physical Exam  Gen: Alert, oriented. Appears stated age.  HEENT: [place]/AT. PERRLA. Lungs: CTA, no wheezes. CV: RR nl S1, S2. Abd: soft, benign, no masses. BS+ Ext: No edema. Pulses 2+    Planned procedures: Proceed with colonoscopy. The patient understands the nature of the planned procedure, indications, risks, alternatives and potential complications including but not limited to bleeding, infection, perforation, damage to internal organs and possible oversedation/side effects from anesthesia. The patient agrees and gives consent to proceed.  Please refer to procedure notes for findings, recommendations and patient disposition/instructions.     Lanett Lasorsa K. Alice [name], M.D. Gastroenterology 05/05/2020  10:51 AM

## 2020-05-05 NOTE — Op Note (Addendum)
[facility] Gastroenterology Patient Name: [name] [name] Procedure Date: 05/05/2020 11:36 AM MRN: [id number] Account #: [id number] Date of Birth: 14-Feb-1956 Admit Type: Outpatient Age: 64 Room: [facility] ENDO ROOM 4 Gender: Female Note Status: Finalized Procedure:             Colonoscopy Indications:           Screening for colorectal malignant neoplasm Providers:             Benay Pike. Alice Reichert MD, MD Referring MD:          Wendee Beavers. Terrilee Croak (Referring MD) Medicines:             Propofol per Anesthesia Complications:         No immediate complications. Procedure:             Pre-Anesthesia Assessment:                        - The risks and benefits of the procedure and the                         sedation options and risks were discussed with the                         patient. All questions were answered and informed                         consent was obtained.                        - Patient identification and proposed procedure were                         verified prior to the procedure by the nurse. The                         procedure was verified in the procedure room.                        - ASA Grade Assessment: III - A patient with severe                         systemic disease.                        - After reviewing the risks and benefits, the patient                         was deemed in satisfactory condition to undergo the                         procedure.                        After obtaining informed consent, the colonoscope was                         passed under direct vision. Throughout the procedure,                         the patient's blood pressure, pulse,  and oxygen                         saturations were monitored continuously. The                         Colonoscope was introduced through the anus and                         advanced to the the cecum, identified by appendiceal                         orifice and ileocecal  valve. The colonoscopy was                         performed without difficulty. The patient tolerated                         the procedure well. The quality of the bowel                         preparation was excellent. The ileocecal valve,                         appendiceal orifice, and rectum were photographed. Findings:      The perianal and digital rectal examinations were normal. Pertinent       negatives include normal sphincter tone and no palpable rectal lesions.      Non-bleeding internal hemorrhoids were found during retroflexion. The       hemorrhoids were Grade I (internal hemorrhoids that do not prolapse).      The entire examined colon appeared normal. Impression:            - Non-bleeding internal hemorrhoids.                        - The entire examined colon is normal.                        - No specimens collected. Recommendation:        - Patient has a contact number available for                         emergencies. The signs and symptoms of potential                         delayed complications were discussed with the patient.                         Return to normal activities tomorrow. Written                         discharge instructions were provided to the patient.                        - Resume previous diet.                        - Continue present medications.                        -  Repeat colonoscopy in 10 years for screening                         purposes.                        - Return to GI office PRN.                        - The findings and recommendations were discussed with                         the patient. Procedure Code(s):     --- Professional ---                        [id number], Colorectal cancer screening; colonoscopy on                         individual not meeting criteria for high risk Diagnosis Code(s):     --- Professional ---                        K64.0, First degree hemorrhoids                        Z12.11, Encounter  for screening for malignant neoplasm                         of colon CPT copyright 2019 American [employer] Association. All rights reserved. The codes documented in this report are preliminary and upon coder review may  be revised to meet current compliance requirements. Efrain Sella MD, MD 05/05/2020 11:56:20 AM This report has been signed electronically. Number of Addenda: 0 Note Initiated On: 05/05/2020 11:36 AM Scope Withdrawal Time: 0 hours 6 minutes 31 seconds  Total Procedure Duration: 0 hours 11 minutes 38 seconds  Estimated Blood Loss:  Estimated blood loss: none.      [facility]

## 2020-05-05 NOTE — Transfer of Care (Signed)
Immediate Anesthesia Transfer of Care Note  Patient: [name] [name]  Procedure(s) Performed: COLONOSCOPY WITH PROPOFOL (N/A )  Patient Location: PACU  Anesthesia Type:General  Level of Consciousness: awake and sedated  Airway & Oxygen Therapy: Patient Spontanous Breathing and Patient connected to nasal cannula oxygen  Post-op Assessment: Report given to RN and Post -op Vital signs reviewed and stable  Post vital signs: Reviewed and stable  Last Vitals:  Vitals Value Taken Time  BP    Temp    Pulse    Resp    SpO2      Last Pain:  Vitals:   05/05/20 1044  TempSrc: Temporal  PainSc: 4          Complications: No apparent anesthesia complications

## 2020-05-06 ENCOUNTER — Encounter: Payer: Self-pay | Admitting: *Deleted

## 2020-05-06 NOTE — Anesthesia Postprocedure Evaluation (Signed)
Anesthesia Post Note  Patient: [name] [name]  Procedure(s) Performed: COLONOSCOPY WITH PROPOFOL (N/A )  Patient location during evaluation: Endoscopy Anesthesia Type: General Level of consciousness: awake and alert Pain management: pain level controlled Vital Signs Assessment: post-procedure vital signs reviewed and stable Respiratory status: spontaneous breathing, nonlabored ventilation and respiratory function stable Cardiovascular status: blood pressure returned to baseline and stable Postop Assessment: no apparent nausea or vomiting Anesthetic complications: no     Last Vitals:  Vitals:   05/05/20 1209 05/05/20 1219  BP: (!) 87/67 115/68  Pulse: 70 61  Resp: 17 (!) 22  Temp:    SpO2: 98% 99%    Last Pain:  Vitals:   05/06/20 0742  TempSrc:   PainSc: 0-No pain                 Alphonsus Sias

## 2020-06-13 ENCOUNTER — Ambulatory Visit: Payer: Self-pay | Admitting: Physician Assistant

## 2020-06-13 NOTE — Progress Notes (Signed)
Established patient visit   Patient: [name] [name]   DOB: 05-Jan-1956   64 y.o. Female  MRN: [id number] Visit Date: 06/14/2020  Today's healthcare provider: Trinna Post, PA-C   Chief Complaint  Patient presents with  . Hypertension  I,Jeramie Scogin M Dino Borntreger,acting as a scribe for [employer], PA-C.,have documented all relevant documentation on the behalf of Trinna Post, PA-C,as directed by  Trinna Post, PA-C while in the presence of Trinna Post, PA-C.  Subjective    HPI Hypertension, follow-up  BP Readings from Last 3 Encounters:  06/14/20 124/82  05/05/20 115/68  11/05/19 122/72   Wt Readings from Last 3 Encounters:  06/14/20 191 lb 9.6 oz (86.9 kg)  05/05/20 190 lb (86.2 kg)  06/24/19 192 lb (87.1 kg)     She was last seen for hypertension 3 months ago.  BP at that visit was 122/72. Management since that visit includes no changes.  She reports good compliance with treatment. She is not having side effects.  She is following a Regular diet. She is exercising. She does not smoke.  Use of agents associated with hypertension: none.   Outside blood pressures are 120's/80's. Symptoms: No chest pain No chest pressure  No palpitations No syncope  No dyspnea No orthopnea  No paroxysmal nocturnal dyspnea No lower extremity edema   Pertinent labs: Lab Results  Component Value Date   CHOL 194 11/05/2019   HDL 53 11/05/2019   LDLCALC 123 (H) 11/05/2019   TRIG 102 11/05/2019   CHOLHDL 3.7 11/05/2019   Lab Results  Component Value Date   NA 140 11/05/2019   K 3.9 11/05/2019   CREATININE 0.92 11/05/2019   GFRNONAA 66 11/05/2019   GFRAA 76 11/05/2019   GLUCOSE 92 11/05/2019     The 10-year ASCVD risk score Mikey Bussing DC Jr., et al., 2013) is: 6.3%   She continues to follow with psychiatry for depression and anxiety.  ---------------------------------------------------------------------------------------------------       Medications: Outpatient Medications Prior to Visit  Medication Sig  . calcium-vitamin D (OSCAL WITH D) 500-200 MG-UNIT tablet Take 1 tablet by mouth See admin instructions. Take 2 tablets by mouth daily in the morning and 1 tablet in the evening  . hydrochlorothiazide (HYDRODIURIL) 12.5 MG tablet TAKE ONE TABLET EVERY DAY WITH LOSARTAN 50MG   . losartan (COZAAR) 50 MG tablet TAKE ONE TABLET EVERY DAY WITH HYDROCHLOROTHIAZIDE 12.5MG  TAB  . Multiple Vitamin (MULTIVITAMIN) capsule Take 1 capsule by mouth daily.  . niacin 500 MG tablet Take 500 mg by mouth 2 (two) times daily with a meal.   . sertraline (ZOLOFT) 50 MG tablet Take 50 mg by mouth every morning.   . traZODone (DESYREL) 100 MG tablet Take 100 mg by mouth at bedtime as needed for sleep.   Marland Kitchen dicyclomine (BENTYL) 10 MG capsule Take 1 capsule (10 mg total) by mouth 3 (three) times daily before meals.  . meloxicam (MOBIC) 7.5 MG tablet Take 1-2 tablets daily. (Patient not taking: Reported on 11/05/2019)  . triamcinolone cream (KENALOG) 0.1 % Apply 1 application topically 2 (two) times daily. (Patient not taking: Reported on 11/05/2019)   No facility-administered medications prior to visit.    Review of Systems  Constitutional: Negative.   Respiratory: Negative.   Cardiovascular: Negative.   Hematological: Negative.       Objective    BP 124/82 (BP Location: Right Arm, Patient Position: Sitting, Cuff Size: Normal)   Pulse 66   Temp Marland Kitchen)  96.6 F (35.9 C) (Temporal)   Wt 191 lb 9.6 oz (86.9 kg)   SpO2 96%   BMI 32.38 kg/m    Physical Exam Constitutional:      Appearance: Normal appearance. She is normal weight.  Cardiovascular:     Rate and Rhythm: Normal rate and regular rhythm.     Pulses: Normal pulses.     Heart sounds: Normal heart sounds.  Pulmonary:     Effort: Pulmonary effort is normal.     Breath sounds: Normal breath sounds.  Skin:    General: Skin is warm and dry.  Neurological:     General: No focal  deficit present.     Mental Status: She is alert and oriented to person, place, and time.  Psychiatric:        Mood and Affect: Mood normal.        Behavior: Behavior normal.       No results found for any visits on 06/14/20.  Assessment & Plan    1. Essential hypertension  Well controlled Continue current medications Recheck metabolic panel at next visit     Return in about 3 months (around 09/14/2020) for Welcome to Medicare.      ITrinna Post, PA-C, have reviewed all documentation for this visit. The documentation on 06/15/20 for the exam, diagnosis, procedures, and orders are all accurate and complete.    Paulene Floor  [facility] [phone] (phone) [phone] (fax)  [place]

## 2020-06-14 ENCOUNTER — Ambulatory Visit: Payer: 59 | Admitting: Physician Assistant

## 2020-06-14 ENCOUNTER — Other Ambulatory Visit: Payer: Self-pay

## 2020-06-14 ENCOUNTER — Encounter: Payer: Self-pay | Admitting: Physician Assistant

## 2020-06-14 VITALS — BP 124/82 | HR 66 | Temp 96.6°F | Wt 191.6 lb

## 2020-06-14 DIAGNOSIS — I10 Essential (primary) hypertension: Secondary | ICD-10-CM

## 2020-06-14 NOTE — Patient Instructions (Signed)

## 2020-06-23 ENCOUNTER — Encounter: Payer: Self-pay | Admitting: Family Medicine

## 2020-06-23 ENCOUNTER — Telehealth (INDEPENDENT_AMBULATORY_CARE_PROVIDER_SITE_OTHER): Payer: 59 | Admitting: Family Medicine

## 2020-06-23 DIAGNOSIS — G4483 Primary cough headache: Secondary | ICD-10-CM

## 2020-06-23 DIAGNOSIS — R0982 Postnasal drip: Secondary | ICD-10-CM | POA: Diagnosis not present

## 2020-06-23 MED ORDER — HYDROCODONE-HOMATROPINE 5-1.5 MG/5ML PO SYRP: 5.0000 mL | ORAL_SOLUTION | Freq: Three times a day (TID) | ORAL | 0 refills | Status: DC | PRN

## 2020-06-23 NOTE — Progress Notes (Signed)
MyChart Video Visit    Virtual Visit via Video Note   This visit type was conducted due to national recommendations for restrictions regarding the COVID-19 Pandemic (e.g. social distancing) in an effort to limit this patient's exposure and mitigate transmission in our community. This patient is at least at moderate risk for complications without adequate follow up. This format is felt to be most appropriate for this patient at this time. Physical exam was limited by quality of the video and audio technology used for the visit.   Patient location: Home Provider location: Office   Patient: [name] [name]   DOB: 1956-07-08   63 y.o. Female  MRN: [id number] Visit Date: 06/23/2020  Today's healthcare provider: Vernie Murders, PA   Chief Complaint  Patient presents with  . Cough   Subjective    Cough This is a new problem. Episode onset: 1 week ago. The problem has been gradually worsening. The cough is non-productive (dry cough). Associated symptoms include headaches, rhinorrhea and a sore throat (scratchy throat). Pertinent negatives include no chest pain, chills, ear congestion, ear pain, fever, hemoptysis, nasal congestion, postnasal drip, shortness of breath, sweats or wheezing. Treatments tried: OTC Robitussin, cough drops. The treatment provided no relief.     Past Medical History:  Diagnosis Date  . Anxiety    not an issue anymore. this was due to broken ankle  . Arthritis    BACK  . Complication of anesthesia    SLOW TO WAKE UP X 1 WITH ANKLE SURGERY  . GERD (gastroesophageal reflux disease)    OCC-NO MEDS  . History of kidney stones   . Hypertension 02/19/2017  . Hypertriglyceridemia 06/25/2016  . Osteopenia 06/25/2016  . PONV (postoperative nausea and vomiting)    Past Surgical History:  Procedure Laterality Date  . ANKLE FRACTURE SURGERY Left 2012  . CHOLECYSTECTOMY    . COLONOSCOPY WITH PROPOFOL N/A 05/05/2020   Procedure: COLONOSCOPY WITH PROPOFOL;   Surgeon: Toledo, Benay Pike, MD;  Location: [facility] ENDOSCOPY;  Service: Gastroenterology;  Laterality: N/A;  . CYSTOSCOPY/URETEROSCOPY/HOLMIUM LASER/STENT PLACEMENT Left 09/05/2018   Procedure: CYSTOSCOPY/URETEROSCOPY/HOLMIUM LASER/STENT PLACEMENT;  Surgeon: Billey Co, MD;  Location: [facility] ORS;  Service: Urology;  Laterality: Left;  . EXTRACORPOREAL SHOCK WAVE LITHOTRIPSY Left 08/21/2018   Procedure: EXTRACORPOREAL SHOCK WAVE LITHOTRIPSY (ESWL);  Surgeon: Abbie Sons, MD;  Location: [facility] ORS;  Service: Urology;  Laterality: Left;  . FOOT SURGERY Right    neuroma removed  . LITHOTRIPSY  1990'S  . TUBAL LIGATION    . VEIN LIGATION AND STRIPPING Left    Social History   Tobacco Use  . Smoking status: Never Smoker  . Smokeless tobacco: Never Used  Vaping Use  . Vaping Use: Never used  Substance Use Topics  . Alcohol use: No  . Drug use: No   Family Status  Relation Name Status  . Mat Aunt  (Not Specified)  . Mother  Deceased  . Father  Deceased       lung cancer  . Sister  Alive  . Brother  Alive  . Sister  Deceased at age 37       respiratory   Allergies  Allergen Reactions  . Oxycodone-Acetaminophen Other (See Comments)    Made her feel like "she was losing her mind". Unsure if the reaction was due to percocet or stress at the time.      Medications: Outpatient Medications Prior to Visit  Medication Sig  . calcium-vitamin D (OSCAL WITH D)  500-200 MG-UNIT tablet Take 1 tablet by mouth See admin instructions. Take 2 tablets by mouth daily in the morning and 1 tablet in the evening  . hydrochlorothiazide (HYDRODIURIL) 12.5 MG tablet TAKE ONE TABLET EVERY DAY WITH LOSARTAN 50MG   . losartan (COZAAR) 50 MG tablet TAKE ONE TABLET EVERY DAY WITH HYDROCHLOROTHIAZIDE 12.5MG  TAB  . niacin 500 MG tablet Take 500 mg by mouth 2 (two) times daily with a meal.   . sertraline (ZOLOFT) 50 MG tablet Take 50 mg by mouth every morning.   . traZODone (DESYREL) 100 MG tablet Take 100 mg  by mouth at bedtime as needed for sleep.   Marland Kitchen dicyclomine (BENTYL) 10 MG capsule Take 1 capsule (10 mg total) by mouth 3 (three) times daily before meals.  . meloxicam (MOBIC) 7.5 MG tablet Take 1-2 tablets daily. (Patient not taking: Reported on 11/05/2019)  . Multiple Vitamin (MULTIVITAMIN) capsule Take 1 capsule by mouth daily. (Patient not taking: Reported on 06/23/2020)  . triamcinolone cream (KENALOG) 0.1 % Apply 1 application topically 2 (two) times daily. (Patient not taking: Reported on 11/05/2019)   No facility-administered medications prior to visit.    Review of Systems  Constitutional: Negative for appetite change, chills, fatigue and fever.  HENT: Positive for rhinorrhea and sore throat (scratchy throat). Negative for ear pain and postnasal drip.   Respiratory: Positive for cough. Negative for hemoptysis, chest tightness, shortness of breath and wheezing.   Cardiovascular: Negative for chest pain and palpitations.  Gastrointestinal: Negative for abdominal pain, nausea and vomiting.  Neurological: Positive for headaches. Negative for dizziness and weakness.    Objective    There were no vitals taken for this visit.   Physical Exam:WDWN female in no apparent acute distress.  Head: Normocephalic, atraumatic. Neck: Supple, NROM Respiratory: No apparent distress Psych: Normal mood and affect      Assessment & Plan     1. Cough headache Dry hacking cough with PND causing headache over the past week. "Same as 2 years ago which was helped with Hycodan syrup without side effects or allergic reaction as with Oxycodone. May use antihistamine prn and Mucinex. Increase fluid. No signs of COVID infection. - HYDROcodone-homatropine (HYCODAN) 5-1.5 MG/5ML syrup; Take 5 mLs by mouth every 8 (eight) hours as needed for cough.  Dispense: 120 mL; Refill: 0  2. Post-nasal drip No congestion or sputum production with cough. May use Flonase with antihistamine and increase fluid intake.  Recheck prn. - HYDROcodone-homatropine (HYCODAN) 5-1.5 MG/5ML syrup; Take 5 mLs by mouth every 8 (eight) hours as needed for cough.  Dispense: 120 mL; Refill: 0   No follow-ups on file.     I discussed the assessment and treatment plan with the patient. The patient was provided an opportunity to ask questions and all were answered. The patient agreed with the plan and demonstrated an understanding of the instructions.   The patient was advised to call back or seek an in-person evaluation if the symptoms worsen or if the condition fails to improve as anticipated.  I provided 20 minutes of non-face-to-face time during this encounter.  Andres Shad, PA, have reviewed all documentation for this visit. The documentation on 06/23/20 for the exam, diagnosis, procedures, and orders are all accurate and complete.   Vernie Murders, [place] [phone] (phone) [phone] (fax)  [place]

## 2020-08-09 ENCOUNTER — Other Ambulatory Visit: Payer: Self-pay | Admitting: Physician Assistant

## 2020-08-09 NOTE — Telephone Encounter (Signed)
[facility] faxed refill request for the following medications:  traZODone (DESYREL) 100 MG tablet   hydrochlorothiazide (HYDRODIURIL) 12.5 MG tablet  sertraline (ZOLOFT) 50 MG tablet    Please advise.

## 2020-08-09 NOTE — Telephone Encounter (Signed)
Please review. Thanks!  

## 2020-08-10 DIAGNOSIS — D2271 Melanocytic nevi of right lower limb, including hip: Secondary | ICD-10-CM | POA: Diagnosis not present

## 2020-08-10 DIAGNOSIS — D2272 Melanocytic nevi of left lower limb, including hip: Secondary | ICD-10-CM | POA: Diagnosis not present

## 2020-08-10 DIAGNOSIS — L821 Other seborrheic keratosis: Secondary | ICD-10-CM | POA: Diagnosis not present

## 2020-08-10 DIAGNOSIS — D2261 Melanocytic nevi of right upper limb, including shoulder: Secondary | ICD-10-CM | POA: Diagnosis not present

## 2020-08-10 DIAGNOSIS — L218 Other seborrheic dermatitis: Secondary | ICD-10-CM | POA: Diagnosis not present

## 2020-08-10 DIAGNOSIS — R202 Paresthesia of skin: Secondary | ICD-10-CM | POA: Diagnosis not present

## 2020-08-10 DIAGNOSIS — D225 Melanocytic nevi of trunk: Secondary | ICD-10-CM | POA: Diagnosis not present

## 2020-08-10 DIAGNOSIS — L718 Other rosacea: Secondary | ICD-10-CM | POA: Diagnosis not present

## 2020-08-10 DIAGNOSIS — D2262 Melanocytic nevi of left upper limb, including shoulder: Secondary | ICD-10-CM | POA: Diagnosis not present

## 2020-08-17 ENCOUNTER — Other Ambulatory Visit: Payer: Self-pay | Admitting: Physician Assistant

## 2020-08-17 MED ORDER — SERTRALINE HCL 50 MG PO TABS: 50.0000 mg | ORAL_TABLET | ORAL | 1 refills | Status: DC

## 2020-08-17 MED ORDER — HYDROCHLOROTHIAZIDE 12.5 MG PO TABS: ORAL_TABLET | ORAL | 1 refills | Status: DC

## 2020-08-17 MED ORDER — TRAZODONE HCL 100 MG PO TABS: 100.0000 mg | ORAL_TABLET | Freq: Every evening | ORAL | 1 refills | Status: DC | PRN

## 2020-08-17 NOTE — Telephone Encounter (Signed)
[facility] faxed refill request for the following medications:  traZODone (DESYREL) 100 MG tablet hydrochlorothiazide (HYDRODIURIL) 12.5 MG tablet sertraline (ZOLOFT) 50 MG tablet  Please advise.  Thanks, [employer]

## 2020-08-24 ENCOUNTER — Ambulatory Visit (INDEPENDENT_AMBULATORY_CARE_PROVIDER_SITE_OTHER): Payer: Medicare HMO | Admitting: Physician Assistant

## 2020-08-24 ENCOUNTER — Other Ambulatory Visit: Payer: Self-pay

## 2020-08-24 ENCOUNTER — Other Ambulatory Visit: Payer: Self-pay | Admitting: Physician Assistant

## 2020-08-24 VITALS — BP 116/75 | HR 74 | Temp 98.6°F | Ht 64.0 in | Wt 191.8 lb

## 2020-08-24 DIAGNOSIS — Z0000 Encounter for general adult medical examination without abnormal findings: Secondary | ICD-10-CM | POA: Diagnosis not present

## 2020-08-24 DIAGNOSIS — I10 Essential (primary) hypertension: Secondary | ICD-10-CM

## 2020-08-24 DIAGNOSIS — Z23 Encounter for immunization: Secondary | ICD-10-CM | POA: Diagnosis not present

## 2020-08-24 MED ORDER — HYDROCHLOROTHIAZIDE 12.5 MG PO TABS: ORAL_TABLET | ORAL | 1 refills | Status: DC

## 2020-08-24 MED ORDER — LOSARTAN POTASSIUM 50 MG PO TABS: ORAL_TABLET | ORAL | 1 refills | Status: DC

## 2020-08-24 NOTE — Telephone Encounter (Signed)
Pt has a new Barrister's clerk. Pt needs refills on hctz and losartan #90 each w/refills. Pt was send today . Pt would like refills

## 2020-08-24 NOTE — Progress Notes (Signed)
Welcome to Medicare Visit     Patient: [name] [name], Female    DOB: 1956/02/14, 64 y.o.   MRN: [id number] Visit Date: 08/24/2020  Today's Provider: Trinna Post, PA-C   Chief Complaint  Patient presents with  . Welcome to Medicare   Subjective    [name] [name] is a 64 y.o. female who presents today for her Annual Wellness Visit. She reports consuming a general diet. The patient does not participate in regular exercise at present. She generally feels well. She reports sleeping well. She does not have additional problems to discuss today.         Medications: Outpatient Medications Prior to Visit  Medication Sig  . calcium-vitamin D (OSCAL WITH D) 500-200 MG-UNIT tablet Take 1 tablet by mouth See admin instructions. Take 2 tablets by mouth daily in the morning and 1 tablet in the evening  . niacin 500 MG tablet Take 500 mg by mouth 2 (two) times daily with a meal.   . sertraline (ZOLOFT) 50 MG tablet Take 1 tablet (50 mg total) by mouth every morning.  . traZODone (DESYREL) 100 MG tablet Take 1 tablet (100 mg total) by mouth at bedtime as needed for sleep.  . [DISCONTINUED] hydrochlorothiazide (HYDRODIURIL) 12.5 MG tablet TAKE ONE TABLET EVERY DAY WITH LOSARTAN 50MG   . [DISCONTINUED] losartan (COZAAR) 50 MG tablet TAKE ONE TABLET EVERY DAY WITH HYDROCHLOROTHIAZIDE 12.5MG  TAB  . dicyclomine (BENTYL) 10 MG capsule Take 1 capsule (10 mg total) by mouth 3 (three) times daily before meals.  Marland Kitchen HYDROcodone-homatropine (HYCODAN) 5-1.5 MG/5ML syrup Take 5 mLs by mouth every 8 (eight) hours as needed for cough. (Patient not taking: Reported on 08/24/2020)  . meloxicam (MOBIC) 7.5 MG tablet Take 1-2 tablets daily. (Patient not taking: Reported on 11/05/2019)  . Multiple Vitamin (MULTIVITAMIN) capsule Take 1 capsule by mouth daily. (Patient not taking: Reported on 06/23/2020)  . triamcinolone cream (KENALOG) 0.1 % Apply 1 application topically 2 (two) times daily. (Patient not  taking: Reported on 11/05/2019)   No facility-administered medications prior to visit.    Allergies  Allergen Reactions  . Oxycodone-Acetaminophen Other (See Comments)    Made her feel like "she was losing her mind". Unsure if the reaction was due to percocet or stress at the time.    Patient Care Team: Paulene Floor as PCP - General (Physician Assistant)  Review of Systems  Constitutional: Negative.   HENT: Negative.   Eyes: Negative.   Respiratory: Negative.   Cardiovascular: Negative.   Gastrointestinal: Negative.   Endocrine: Negative.   Genitourinary: Negative.   Musculoskeletal: Negative.   Skin: Negative.   Allergic/Immunologic: Negative.   Neurological: Negative.   Hematological: Negative.   Psychiatric/Behavioral: Negative.        Objective    Vitals: BP 116/75   Pulse 74   Temp 98.6 F (37 C)   Ht 5\' 4"  (1.626 m)   Wt 191 lb 12.8 oz (87 kg)   BMI 32.92 kg/m     Physical Exam Constitutional:      Appearance: Normal appearance.  HENT:     Right Ear: Tympanic membrane normal.     Left Ear: Tympanic membrane normal.  Eyes:     Pupils: Pupils are equal, round, and reactive to light.  Cardiovascular:     Rate and Rhythm: Regular rhythm.     Pulses: Normal pulses.     Heart sounds: Normal heart sounds.  Pulmonary:     Effort: Pulmonary  effort is normal.     Breath sounds: Normal breath sounds.  Abdominal:     General: Bowel sounds are normal.  Skin:    General: Skin is warm and dry.  Neurological:     Mental Status: She is alert and oriented to person, place, and time. Mental status is at baseline.  Psychiatric:        Mood and Affect: Mood normal.        Behavior: Behavior normal.    Most recent functional status assessment: In your present state of health, do you have any difficulty performing the following activities: 11/05/2019  Hearing? N  Vision? N  Difficulty concentrating or making decisions? N  Walking or climbing stairs?  N  Dressing or bathing? N  Doing errands, shopping? N  Some recent data might be hidden   Most recent fall risk assessment: Fall Risk  11/05/2019  Falls in the past year? 0  Number falls in past yr: 0  Injury with Fall? 0    Most recent depression screenings: PHQ 2/9 Scores 08/25/2020 11/05/2019  PHQ - 2 Score 0 0  PHQ- 9 Score 0 2  Exception Documentation - -   Most recent cognitive screening: No flowsheet data found. Most recent Audit-C alcohol use screening Alcohol Use Disorder Test (AUDIT) 11/05/2019  1. How often do you have a drink containing alcohol? 0  2. How many drinks containing alcohol do you have on a typical day when you are drinking? 0  3. How often do you have six or more drinks on one occasion? 0  AUDIT-C Score 0   A score of 3 or more in women, and 4 or more in men indicates increased risk for alcohol abuse, EXCEPT if all of the points are from question 1   No results found for any visits on 08/24/20.  Assessment & Plan     Annual wellness visit done today including the all of the following: Reviewed patient's Family Medical History Reviewed and updated list of patient's medical providers Assessment of cognitive impairment was done Assessed patient's functional ability Established a written schedule for health screening [place] Completed and Reviewed  Exercise Activities and Dietary recommendations Goals   None     Immunization History  Administered Date(s) Administered  . Influenza,inj,Quad PF,6+ Mos 11/05/2019  . Influenza-Unspecified 09/27/2018  . PFIZER SARS-COV-2 Vaccination 04/27/2020, 05/18/2020  . Td 09/30/2018  . Tdap 09/30/2018  . Zoster 06/18/2011    Health Maintenance  Topic Date Due  . INFLUENZA VACCINE  07/24/2020  . PNA vac Low Risk Adult (1 of 2 - PCV13) 08/22/2020  . MAMMOGRAM  02/23/2022  . PAP SMEAR-Modifier  11/04/2022  . TETANUS/TDAP  09/30/2028  . COLONOSCOPY  05/06/2030  . DEXA SCAN   Completed  . COVID-19 Vaccine  Completed  . Hepatitis C Screening  Completed  . HIV Screening  Completed     Discussed health benefits of physical activity, and encouraged her to engage in regular exercise appropriate for her age and condition.    1. Welcome to Medicare preventive visit  - EKG 12-Lead  2. Need for vaccination against Streptococcus pneumoniae  - Pneumococcal conjugate vaccine 13-valent   Return in about 3 months (around 11/23/2020) for CPE.     ITrinna Post, PA-C, have reviewed all documentation for this visit. The documentation on 08/25/20 for the exam, diagnosis, procedures, and orders are all accurate and complete.  The entirety of the information documented in the History  of Present Illness, Review of Systems and Physical Exam were personally obtained by me. Portions of this information were initially documented by [facility] and reviewed by me for thoroughness and accuracy.     Paulene Floor  [facility] [phone] (phone) [phone] (fax)  [place]

## 2020-09-02 ENCOUNTER — Telehealth: Payer: Self-pay

## 2020-09-02 DIAGNOSIS — I10 Essential (primary) hypertension: Secondary | ICD-10-CM

## 2020-09-02 MED ORDER — LOSARTAN POTASSIUM 50 MG PO TABS: ORAL_TABLET | ORAL | 0 refills | Status: DC

## 2020-09-02 NOTE — Telephone Encounter (Signed)
Copied from [place] [id number]. Topic: General - Inquiry >> Sep 02, 2020 12:39 PM Gillis Ends D wrote: Reason for CRM: [facility] pharmacy called and asked if we could send a shortfill to the pharmacy to hold the patient over until she gets her mail order. It was lost in the mail. The medication is Losartan 50mg  tablets. A seven day supply. Please advise.

## 2020-09-02 NOTE — Telephone Encounter (Signed)
Shortfill was sent into the pharmacy.

## 2020-09-26 DIAGNOSIS — M778 Other enthesopathies, not elsewhere classified: Secondary | ICD-10-CM | POA: Diagnosis not present

## 2020-09-26 DIAGNOSIS — L851 Acquired keratosis [keratoderma] palmaris et plantaris: Secondary | ICD-10-CM | POA: Diagnosis not present

## 2020-11-29 ENCOUNTER — Ambulatory Visit (INDEPENDENT_AMBULATORY_CARE_PROVIDER_SITE_OTHER): Payer: Medicare HMO | Admitting: Physician Assistant

## 2020-11-29 ENCOUNTER — Other Ambulatory Visit: Payer: Self-pay

## 2020-11-29 ENCOUNTER — Other Ambulatory Visit (HOSPITAL_COMMUNITY)
Admission: RE | Admit: 2020-11-29 | Discharge: 2020-11-29 | Disposition: A | Discharge status: Home / Self Care | Payer: Medicare HMO | Source: Ambulatory Visit | Attending: Physician Assistant | Admitting: Physician Assistant

## 2020-11-29 ENCOUNTER — Encounter: Payer: Self-pay | Admitting: Physician Assistant

## 2020-11-29 VITALS — BP 121/72 | HR 78 | Temp 98.8°F | Ht 64.0 in

## 2020-11-29 DIAGNOSIS — M545 Low back pain, unspecified: Secondary | ICD-10-CM | POA: Diagnosis not present

## 2020-11-29 DIAGNOSIS — Z124 Encounter for screening for malignant neoplasm of cervix: Secondary | ICD-10-CM | POA: Diagnosis not present

## 2020-11-29 DIAGNOSIS — Z1151 Encounter for screening for human papillomavirus (HPV): Secondary | ICD-10-CM | POA: Insufficient documentation

## 2020-11-29 DIAGNOSIS — R7303 Prediabetes: Secondary | ICD-10-CM

## 2020-11-29 DIAGNOSIS — Z0000 Encounter for general adult medical examination without abnormal findings: Secondary | ICD-10-CM

## 2020-11-29 NOTE — Progress Notes (Signed)
Complete physical exam   Patient: [name] [name]   DOB: 01/02/1956   64 y.o. Female  MRN: [id number] Visit Date: 11/29/2020  Today's healthcare provider: Trinna Post, PA-C   Chief Complaint  Patient presents with  . Annual Exam  I,Adriana M Pollak,acting as a scribe for Trinna Post, PA-C.,have documented all relevant documentation on the behalf of Trinna Post, PA-C,as directed by  Trinna Post, PA-C while in the presence of Trinna Post, PA-C.  Subjective    [name] [name] is a 64 y.o. female who presents today for a complete physical exam.  She reports consuming a general diet. Gym/ health club routine includes water. She generally feels well. She reports sleeping fairly well. Due to not being able to take Trazodone nightly. She does have additional problems to discuss today.  HPI   Last PAP 2020 came back with insufficient cellularity so will repeat today.   Patient also reports some intermittent back pain. This has been ongoing for at least a year, is located in the lower back and is more aching. She has tried some muscle relaxers previously which were only modestly helpful. No prior imaging. She does care for her disable granddaughter, frequently lifting her. No radiation of pain, numbness or tingling down the legs.    Past Medical History:  Diagnosis Date  . Anxiety    not an issue anymore. this was due to broken ankle  . Arthritis    BACK  . Complication of anesthesia    SLOW TO WAKE UP X 1 WITH ANKLE SURGERY  . GERD (gastroesophageal reflux disease)    OCC-NO MEDS  . History of kidney stones   . Hypertension 02/19/2017  . Hypertriglyceridemia 06/25/2016  . Osteopenia 06/25/2016  . PONV (postoperative nausea and vomiting)    Past Surgical History:  Procedure Laterality Date  . ANKLE FRACTURE SURGERY Left 2012  . CHOLECYSTECTOMY    . COLONOSCOPY WITH PROPOFOL N/A 05/05/2020   Procedure: COLONOSCOPY WITH PROPOFOL;  Surgeon: Toledo,  Benay Pike, MD;  Location: [facility] ENDOSCOPY;  Service: Gastroenterology;  Laterality: N/A;  . CYSTOSCOPY/URETEROSCOPY/HOLMIUM LASER/STENT PLACEMENT Left 09/05/2018   Procedure: CYSTOSCOPY/URETEROSCOPY/HOLMIUM LASER/STENT PLACEMENT;  Surgeon: Billey Co, MD;  Location: [facility] ORS;  Service: Urology;  Laterality: Left;  . EXTRACORPOREAL SHOCK WAVE LITHOTRIPSY Left 08/21/2018   Procedure: EXTRACORPOREAL SHOCK WAVE LITHOTRIPSY (ESWL);  Surgeon: Abbie Sons, MD;  Location: [facility] ORS;  Service: Urology;  Laterality: Left;  . FOOT SURGERY Right    neuroma removed  . LITHOTRIPSY  1990'S  . TUBAL LIGATION    . VEIN LIGATION AND STRIPPING Left    Social History   Socioeconomic History  . Marital status: Married    Spouse name: Clair Gulling  . Number of children: 6  . Years of education: Not on file  . Highest education level: Not on file  Occupational History    Comment: retired  Tobacco Use  . Smoking status: Never Smoker  . Smokeless tobacco: Never Used  Vaping Use  . Vaping Use: Never used  Substance and Sexual Activity  . Alcohol use: No  . Drug use: No  . Sexual activity: Not on file  Other Topics Concern  . Not on file  Social History Narrative  . Not on file   Social Determinants of Health   Financial Resource Strain:   . Difficulty of Paying Living Expenses: Not on file  Food Insecurity:   . Worried About [employer] of  Food in the Last Year: Not on file  . Ran Out of Food in the Last Year: Not on file  Transportation Needs:   . Lack of Transportation (Medical): Not on file  . Lack of Transportation (Non-Medical): Not on file  Physical Activity:   . Days of Exercise per Week: Not on file  . Minutes of Exercise per Session: Not on file  Stress:   . Feeling of Stress : Not on file  Social Connections:   . Frequency of Communication with Friends and Family: Not on file  . Frequency of Social Gatherings with Friends and Family: Not on file  . Attends Religious Services: Not  on file  . Active Member of Clubs or Organizations: Not on file  . Attends Archivist Meetings: Not on file  . Marital Status: Not on file  Intimate Partner Violence:   . Fear of Current or Ex-Partner: Not on file  . Emotionally Abused: Not on file  . Physically Abused: Not on file  . Sexually Abused: Not on file   Family Status  Relation Name Status  . Mat Aunt  (Not Specified)  . Mother  Deceased  . Father  Deceased       lung cancer  . Sister  Alive  . Brother  Alive  . Sister  Deceased at age 24       respiratory   Family History  Problem Relation Age of Onset  . Breast cancer Maternal Aunt 60  . Kidney disease Mother   . Alzheimer's disease Mother   . Cancer Father    Allergies  Allergen Reactions  . Oxycodone-Acetaminophen Other (See Comments)    Made her feel like "she was losing her mind". Unsure if the reaction was due to percocet or stress at the time.    Patient Care Team: Paulene Floor as PCP - General (Physician Assistant)   Medications: Outpatient Medications Prior to Visit  Medication Sig  . calcium-vitamin D (OSCAL WITH D) 500-200 MG-UNIT tablet Take 1 tablet by mouth See admin instructions. Take 2 tablets by mouth daily in the morning and 1 tablet in the evening  . hydrochlorothiazide (HYDRODIURIL) 12.5 MG tablet TAKE ONE TABLET EVERY DAY WITH LOSARTAN 50MG   . losartan (COZAAR) 50 MG tablet TAKE ONE TABLET EVERY DAY WITH HYDROCHLOROTHIAZIDE 12.5MG  TAB  . Multiple Vitamin (MULTIVITAMIN) capsule Take 1 capsule by mouth daily.   . niacin 500 MG tablet Take 500 mg by mouth 2 (two) times daily with a meal.   . sertraline (ZOLOFT) 50 MG tablet Take 1 tablet (50 mg total) by mouth every morning.  . traZODone (DESYREL) 100 MG tablet Take 1 tablet (100 mg total) by mouth at bedtime as needed for sleep.  . [DISCONTINUED] dicyclomine (BENTYL) 10 MG capsule Take 1 capsule (10 mg total) by mouth 3 (three) times daily before meals.  .  [DISCONTINUED] HYDROcodone-homatropine (HYCODAN) 5-1.5 MG/5ML syrup Take 5 mLs by mouth every 8 (eight) hours as needed for cough. (Patient not taking: Reported on 08/24/2020)  . [DISCONTINUED] meloxicam (MOBIC) 7.5 MG tablet Take 1-2 tablets daily. (Patient not taking: Reported on 11/05/2019)  . [DISCONTINUED] triamcinolone cream (KENALOG) 0.1 % Apply 1 application topically 2 (two) times daily. (Patient not taking: Reported on 11/05/2019)   No facility-administered medications prior to visit.    Review of Systems  Constitutional: Negative.   HENT: Negative.   Eyes: Negative.   Respiratory: Negative.   Cardiovascular: Negative.   Gastrointestinal: Negative.  Endocrine: Negative.   Genitourinary: Negative.   Musculoskeletal: Positive for back pain.  Skin: Negative.   Allergic/Immunologic: Negative.   Neurological: Negative.   Hematological: Negative.   Psychiatric/Behavioral: Positive for sleep disturbance.      Objective    BP 121/72 (BP Location: Left Arm, Patient Position: Sitting, Cuff Size: Large)   Pulse 78   Temp 98.8 F (37.1 C) (Oral)   Ht 5\' 4"  (1.626 m)   SpO2 100%   BMI 32.92 kg/m    Physical Exam Constitutional:      Appearance: Normal appearance.  HENT:     Right Ear: Tympanic membrane, ear canal and external ear normal.     Left Ear: Tympanic membrane, ear canal and external ear normal.  Cardiovascular:     Rate and Rhythm: Normal rate and regular rhythm.     Pulses: Normal pulses.     Heart sounds: Normal heart sounds.  Pulmonary:     Effort: Pulmonary effort is normal.     Breath sounds: Normal breath sounds.  Abdominal:     General: Abdomen is flat. Bowel sounds are normal.     Palpations: Abdomen is soft.  Genitourinary:    General: Normal vulva.     Vagina: Normal.     Cervix: Normal.  Musculoskeletal:     Cervical back: Normal range of motion and neck supple.  Lymphadenopathy:     Cervical: No cervical adenopathy.  Skin:    General:  Skin is warm and dry.  Neurological:     General: No focal deficit present.     Mental Status: She is alert and oriented to person, place, and time. Mental status is at baseline.  Psychiatric:        Mood and Affect: Mood normal.        Behavior: Behavior normal.       Last depression screening scores PHQ 2/9 Scores 11/29/2020 08/25/2020 11/05/2019  PHQ - 2 Score 1 0 0  PHQ- 9 Score 3 0 2  Exception Documentation - - -   Last fall risk screening Fall Risk  11/29/2020  Falls in the past year? 0  Number falls in past yr: 0  Injury with Fall? 0  Risk for fall due to : No Fall Risks  Follow up Falls evaluation completed   Last Audit-C alcohol use screening Alcohol Use Disorder Test (AUDIT) 11/29/2020  1. How often do you have a drink containing alcohol? 0  2. How many drinks containing alcohol do you have on a typical day when you are drinking? 0  3. How often do you have six or more drinks on one occasion? 0  AUDIT-C Score 0   A score of 3 or more in women, and 4 or more in men indicates increased risk for alcohol abuse, EXCEPT if all of the points are from question 1   Results for orders placed or performed in visit on 11/29/20  TSH  Result Value Ref Range   TSH 2.070 0.450 - 4.500 uIU/mL  Lipid panel  Result Value Ref Range   Cholesterol, Total 215 (H) 100 - 199 mg/dL   Triglycerides 146 0 - 149 mg/dL   HDL 51 >39 mg/dL   VLDL Cholesterol Cal 26 5 - 40 mg/dL   LDL Chol Calc (NIH) 138 (H) 0 - 99 mg/dL   Chol/HDL Ratio 4.2 0.0 - 4.4 ratio  Comprehensive metabolic panel  Result Value Ref Range   Glucose 85 65 - 99 mg/dL   BUN  15 8 - 27 mg/dL   Creatinine, Ser 1.03 (H) 0.57 - 1.00 mg/dL   GFR calc non Af Amer 57 (L) >59 mL/min/1.73   GFR calc Af Amer 66 >59 mL/min/1.73   BUN/Creatinine Ratio 15 12 - 28   Sodium 145 (H) 134 - 144 mmol/L   Potassium 4.3 3.5 - 5.2 mmol/L   Chloride 103 96 - 106 mmol/L   CO2 28 20 - 29 mmol/L   Calcium 9.7 8.7 - 10.3 mg/dL   Total Protein  7.4 6.0 - 8.5 g/dL   Albumin 4.4 3.8 - 4.8 g/dL   Globulin, Total 3.0 1.5 - 4.5 g/dL   Albumin/Globulin Ratio 1.5 1.2 - 2.2   Bilirubin Total 0.3 0.0 - 1.2 mg/dL   Alkaline Phosphatase 90 44 - 121 IU/L   AST 24 0 - 40 IU/L   ALT 22 0 - 32 IU/L  CBC with Differential/Platelet  Result Value Ref Range   WBC 5.9 3.4 - 10.8 x10E3/uL   RBC 4.80 3.77 - 5.28 x10E6/uL   Hemoglobin 13.7 11.1 - 15.9 g/dL   Hematocrit 42.4 34.0 - 46.6 %   MCV 88 79 - 97 fL   MCH 28.5 26.6 - 33.0 pg   MCHC 32.3 31 - 35 g/dL   RDW 13.3 11.7 - 15.4 %   Platelets 295 150 - 450 x10E3/uL   Neutrophils 59 Not Estab. %   Lymphs 27 Not Estab. %   Monocytes 10 Not Estab. %   Eos 3 Not Estab. %   Basos 1 Not Estab. %   Neutrophils Absolute 3.5 1.40 - 7.00 x10E3/uL   Lymphocytes Absolute 1.6 0 - 3 x10E3/uL   Monocytes Absolute 0.6 0 - 0 x10E3/uL   EOS (ABSOLUTE) 0.2 0.0 - 0.4 x10E3/uL   Basophils Absolute 0.0 0 - 0 x10E3/uL   Immature Granulocytes 0 Not Estab. %   Immature Grans (Abs) 0.0 0.0 - 0.1 x10E3/uL  Hemoglobin A1c  Result Value Ref Range   Hgb A1c MFr Bld 5.9 (H) 4.8 - 5.6 %   Est. average glucose Bld gHb Est-mCnc 123 mg/dL    Assessment & Plan    Routine Health Maintenance and Physical Exam  Exercise Activities and Dietary recommendations Goals   None     Immunization History  Administered Date(s) Administered  . Influenza,inj,Quad PF,6+ Mos 11/05/2019  . Influenza-Unspecified 09/27/2018, 11/24/2020  . PFIZER SARS-COV-2 Vaccination 04/27/2020, 05/18/2020  . Pneumococcal Conjugate-13 08/24/2020  . Td 09/30/2018  . Tdap 09/30/2018  . Zoster 06/18/2011    Health Maintenance  Topic Date Due  . PNA vac Low Risk Adult (2 of 2 - PPSV23) 08/24/2021  . MAMMOGRAM  02/23/2022  . PAP SMEAR-Modifier  11/04/2022  . TETANUS/TDAP  09/30/2028  . COLONOSCOPY  05/06/2030  . INFLUENZA VACCINE  Completed  . DEXA SCAN  Completed  . COVID-19 Vaccine  Completed  . Hepatitis C Screening  Completed  . HIV  Screening  Completed    Discussed health benefits of physical activity, and encouraged her to engage in regular exercise appropriate for her age and condition.  1. Annual physical exam  - TSH - Lipid panel - Comprehensive metabolic panel - CBC with Differential/Platelet  2. Cervical cancer screening  - Cytology - PAP  3. Acute bilateral low back pain without sciatica  May consider PT if pain persists.   - DG Lumbar Spine Complete; Future  4. Prediabetes  - HgB A1c   Return in about 6 months (around 05/30/2021) for  HTN.     I, Trinna Post, PA-C, have reviewed all documentation for this visit. The documentation on 11/30/20 for the exam, diagnosis, procedures, and orders are all accurate and complete.  The entirety of the information documented in the History of Present Illness, Review of Systems and Physical Exam were personally obtained by me. Portions of this information were initially documented by [facility] and reviewed by me for thoroughness and accuracy.     Paulene Floor  [facility] [phone] (phone) [phone] (fax)  [place]

## 2020-11-30 LAB — COMPREHENSIVE METABOLIC PANEL
ALT: 22 IU/L (ref 0–32)
AST: 24 IU/L (ref 0–40)
Albumin/Globulin Ratio: 1.5 (ref 1.2–2.2)
Albumin: 4.4 g/dL (ref 3.8–4.8)
Alkaline Phosphatase: 90 IU/L (ref 44–121)
BUN/Creatinine Ratio: 15 (ref 12–28)
BUN: 15 mg/dL (ref 8–27)
Bilirubin Total: 0.3 mg/dL (ref 0.0–1.2)
CO2: 28 mmol/L (ref 20–29)
Calcium: 9.7 mg/dL (ref 8.7–10.3)
Chloride: 103 mmol/L (ref 96–106)
Creatinine, Ser: 1.03 mg/dL — ABNORMAL HIGH (ref 0.57–1.00)
GFR calc Af Amer: 66 mL/min/{1.73_m2} (ref >59)
GFR calc non Af Amer: 57 mL/min/{1.73_m2} — ABNORMAL LOW (ref >59)
Globulin, Total: 3 g/dL (ref 1.5–4.5)
Glucose: 85 mg/dL (ref 65–99)
Potassium: 4.3 mmol/L (ref 3.5–5.2)
Sodium: 145 mmol/L — ABNORMAL HIGH (ref 134–144)
Total Protein: 7.4 g/dL (ref 6.0–8.5)

## 2020-11-30 LAB — CBC WITH DIFFERENTIAL/PLATELET
Basophils Absolute: 0 10*3/uL (ref 0.0–0.2)
Basos: 1 %
EOS (ABSOLUTE): 0.2 10*3/uL (ref 0.0–0.4)
Eos: 3 %
Hematocrit: 42.4 % (ref 34.0–46.6)
Hemoglobin: 13.7 g/dL (ref 11.1–15.9)
Immature Grans (Abs): 0 10*3/uL (ref 0.0–0.1)
Immature Granulocytes: 0 %
Lymphocytes Absolute: 1.6 10*3/uL (ref 0.7–3.1)
Lymphs: 27 %
MCH: 28.5 pg (ref 26.6–33.0)
MCHC: 32.3 g/dL (ref 31.5–35.7)
MCV: 88 fL (ref 79–97)
Monocytes Absolute: 0.6 10*3/uL (ref 0.1–0.9)
Monocytes: 10 %
Neutrophils Absolute: 3.5 10*3/uL (ref 1.4–7.0)
Neutrophils: 59 %
Platelets: 295 10*3/uL (ref 150–450)
RBC: 4.8 x10E6/uL (ref 3.77–5.28)
RDW: 13.3 % (ref 11.7–15.4)
WBC: 5.9 10*3/uL (ref 3.4–10.8)

## 2020-11-30 LAB — LIPID PANEL
Chol/HDL Ratio: 4.2 ratio (ref 0.0–4.4)
Cholesterol, Total: 215 mg/dL — ABNORMAL HIGH (ref 100–199)
HDL: 51 mg/dL (ref >39)
LDL Chol Calc (NIH): 138 mg/dL — ABNORMAL HIGH (ref 0–99)
Triglycerides: 146 mg/dL (ref 0–149)
VLDL Cholesterol Cal: 26 mg/dL (ref 5–40)

## 2020-11-30 LAB — TSH: TSH: 2.07 u[IU]/mL (ref 0.450–4.500)

## 2020-11-30 LAB — HEMOGLOBIN A1C
Est. average glucose Bld gHb Est-mCnc: 123 mg/dL
Hgb A1c MFr Bld: 5.9 % — ABNORMAL HIGH (ref 4.8–5.6)

## 2020-12-02 LAB — CYTOLOGY - PAP
Comment: NEGATIVE
Diagnosis: NEGATIVE
High risk HPV: NEGATIVE

## 2021-02-13 ENCOUNTER — Emergency Department: Payer: Medicare HMO

## 2021-02-13 ENCOUNTER — Other Ambulatory Visit: Payer: Self-pay

## 2021-02-13 ENCOUNTER — Encounter: Payer: Self-pay | Admitting: Emergency Medicine

## 2021-02-13 ENCOUNTER — Emergency Department
Admission: EM | Admit: 2021-02-13 | Discharge: 2021-02-13 | Disposition: A | Discharge status: Home / Self Care | Payer: Medicare HMO | Attending: Emergency Medicine | Admitting: Emergency Medicine

## 2021-02-13 DIAGNOSIS — S0990XA Unspecified injury of head, initial encounter: Secondary | ICD-10-CM | POA: Diagnosis not present

## 2021-02-13 DIAGNOSIS — S0081XA Abrasion of other part of head, initial encounter: Secondary | ICD-10-CM | POA: Diagnosis not present

## 2021-02-13 DIAGNOSIS — M25531 Pain in right wrist: Secondary | ICD-10-CM | POA: Diagnosis not present

## 2021-02-13 DIAGNOSIS — W01198A Fall on same level from slipping, tripping and stumbling with subsequent striking against other object, initial encounter: Secondary | ICD-10-CM | POA: Insufficient documentation

## 2021-02-13 DIAGNOSIS — Y92480 Sidewalk as the place of occurrence of the external cause: Secondary | ICD-10-CM | POA: Insufficient documentation

## 2021-02-13 DIAGNOSIS — I10 Essential (primary) hypertension: Secondary | ICD-10-CM | POA: Insufficient documentation

## 2021-02-13 DIAGNOSIS — Z043 Encounter for examination and observation following other accident: Secondary | ICD-10-CM | POA: Diagnosis not present

## 2021-02-13 DIAGNOSIS — Z79899 Other long term (current) drug therapy: Secondary | ICD-10-CM | POA: Insufficient documentation

## 2021-02-13 DIAGNOSIS — W19XXXA Unspecified fall, initial encounter: Secondary | ICD-10-CM

## 2021-02-13 MED ORDER — CEPHALEXIN 500 MG PO CAPS
1000.0000 mg | ORAL_CAPSULE | Freq: Once | ORAL | Status: AC
  Administered 2021-02-13: 1000 mg via ORAL
  Filled 2021-02-13: qty 2

## 2021-02-13 MED ORDER — MELOXICAM 15 MG PO TABS: 15.0000 mg | ORAL_TABLET | Freq: Every day | ORAL | 0 refills | Status: AC

## 2021-02-13 MED ORDER — CEPHALEXIN 500 MG PO CAPS: 1000.0000 mg | ORAL_CAPSULE | Freq: Two times a day (BID) | ORAL | 0 refills | Status: AC

## 2021-02-13 MED ORDER — HYDROCODONE-ACETAMINOPHEN 5-325 MG PO TABS
1.0000 | ORAL_TABLET | Freq: Once | ORAL | Status: AC
  Administered 2021-02-13: 1 via ORAL
  Filled 2021-02-13: qty 1

## 2021-02-13 MED ORDER — MELOXICAM 7.5 MG PO TABS
15.0000 mg | ORAL_TABLET | Freq: Once | ORAL | Status: AC
  Administered 2021-02-13: 15 mg via ORAL
  Filled 2021-02-13: qty 2

## 2021-02-13 MED ORDER — HYDROCODONE-ACETAMINOPHEN 5-325 MG PO TABS: 1.0000 | ORAL_TABLET | ORAL | 0 refills | Status: DC | PRN

## 2021-02-13 MED ORDER — BACITRACIN ZINC 500 UNIT/GM EX OINT: TOPICAL_OINTMENT | Freq: Two times a day (BID) | CUTANEOUS | Status: DC

## 2021-02-13 NOTE — Discharge Instructions (Addendum)
Take keflex as prescribed. Complete full course of antibiotics unless instructed to stop by another healthcare provider. Take mobic (anti-inflammatory) once daily with food. You have been prescribed a short course of Norco for pain. Please use this in addition to tylenol for pain. Follow up with Neurosurgery regarding neck findings of CT.

## 2021-02-13 NOTE — ED Provider Notes (Signed)
[facility] Emergency Department Provider Note  ____________________________________________   None    (approximate)  I have reviewed the triage vital signs and the nursing notes.   HISTORY  Chief Complaint Fall  HPI [name] [name] is a 65 y.o. female who reports to the emergency department for evaluation following a fall.  Patient states that she was holding her granddaughter, who is a 23-year-old with special needs, in her left arm, and tripped on sidewalk.  She caught herself on the right side and with a face plant in order to protect her grandchild from injury.  She is complaining of facial pain, right wrist pain.  She denies loss of consciousness, is not on any blood thinners.  She reports significant pain in her nasal area as well as her upper teeth.  No alleviating factors have been attempted at this time.  Pain is rated 10/10.  Reportedly Tdap is up-to-date.  Worse with movement of the face, smiling, biting.        Past Medical History:  Diagnosis Date   Anxiety    not an issue anymore. this was due to broken ankle   Arthritis    BACK   Complication of anesthesia    SLOW TO WAKE UP X 1 WITH ANKLE SURGERY   GERD (gastroesophageal reflux disease)    OCC-NO MEDS   History of kidney stones    Hypertension 02/19/2017   Hypertriglyceridemia 06/25/2016   Osteopenia 06/25/2016   PONV (postoperative nausea and vomiting)     Patient Active Problem List   Diagnosis Date Noted   Prediabetes 04/01/2019   Depression 04/01/2019   Hypertension 02/19/2017   Hypertriglyceridemia 06/25/2016   Osteopenia 06/25/2016   Buzzing in ear 06/25/2016   Avitaminosis D 06/25/2016   Cannot sleep 03/28/2007    Past Surgical History:  Procedure Laterality Date   ANKLE FRACTURE SURGERY Left 2012   CHOLECYSTECTOMY     COLONOSCOPY WITH PROPOFOL N/A 05/05/2020   Procedure: COLONOSCOPY WITH PROPOFOL;  Surgeon: Toledo, Benay Pike, MD;  Location: [facility]  ENDOSCOPY;  Service: Gastroenterology;  Laterality: N/A;   CYSTOSCOPY/URETEROSCOPY/HOLMIUM LASER/STENT PLACEMENT Left 09/05/2018   Procedure: CYSTOSCOPY/URETEROSCOPY/HOLMIUM LASER/STENT PLACEMENT;  Surgeon: Billey Co, MD;  Location: [facility] ORS;  Service: Urology;  Laterality: Left;   EXTRACORPOREAL SHOCK WAVE LITHOTRIPSY Left 08/21/2018   Procedure: EXTRACORPOREAL SHOCK WAVE LITHOTRIPSY (ESWL);  Surgeon: Abbie Sons, MD;  Location: [facility] ORS;  Service: Urology;  Laterality: Left;   FOOT SURGERY Right    neuroma removed   LITHOTRIPSY  1990'S   TUBAL LIGATION     VEIN LIGATION AND STRIPPING Left     Prior to Admission medications   Medication Sig Start Date End Date Taking? Authorizing Provider  cephALEXin (KEFLEX) 500 MG capsule Take 2 capsules (1,000 mg total) by mouth 2 (two) times daily for 7 days. 02/13/21 02/20/21 Yes Maxine Fredman, Farrel Gordon, PA  HYDROcodone-acetaminophen (NORCO) 5-325 MG tablet Take 1 tablet by mouth every 4 (four) hours as needed for moderate pain. 02/13/21  Yes Marlana Salvage, PA  meloxicam (MOBIC) 15 MG tablet Take 1 tablet (15 mg total) by mouth daily for 15 days. 02/13/21 02/28/21 Yes Elaysia Devargas, Farrel Gordon, PA  calcium-vitamin D (OSCAL WITH D) 500-200 MG-UNIT tablet Take 1 tablet by mouth See admin instructions. Take 2 tablets by mouth daily in the morning and 1 tablet in the evening    [provider]  hydrochlorothiazide (HYDRODIURIL) 12.5 MG tablet TAKE ONE TABLET EVERY DAY WITH LOSARTAN 50MG   08/24/20   Virginia Crews, MD  losartan (COZAAR) 50 MG tablet TAKE ONE TABLET EVERY DAY WITH HYDROCHLOROTHIAZIDE 12.5MG  TAB 09/02/20   Trinna Post, PA-C  Multiple Vitamin (MULTIVITAMIN) capsule Take 1 capsule by mouth daily.     [provider]  niacin 500 MG tablet Take 500 mg by mouth 2 (two) times daily with a meal.     [provider]  sertraline (ZOLOFT) 50 MG tablet Take 1 tablet (50 mg total) by mouth every morning. 08/17/20    Bacigalupo, Dionne Bucy, MD  traZODone (DESYREL) 100 MG tablet Take 1 tablet (100 mg total) by mouth at bedtime as needed for sleep. 08/17/20   Bacigalupo, Dionne Bucy, MD    Allergies Oxycodone-acetaminophen  Family History  Problem Relation Age of Onset   Breast cancer Maternal Aunt 46   Kidney disease Mother    Alzheimer's disease Mother    Cancer Father     Social History Social History   Tobacco Use   Smoking status: Never Smoker   Smokeless tobacco: Never Used  Scientific laboratory technician Use: Never used  Substance Use Topics   Alcohol use: No   Drug use: No    Review of Systems Constitutional: No fever/chills Eyes: No visual changes. ENT: + Nasal pain, + upper jaw pain, no sore throat. Cardiovascular: Denies chest pain. Respiratory: Denies shortness of breath. Gastrointestinal: No abdominal pain.  No nausea, no vomiting.  No diarrhea.  No constipation. Genitourinary: Negative for dysuria. Musculoskeletal: + Right wrist pain, negative for back pain. Skin: + Facial abrasions, negative for rash. Neurological: + headaches, negative for focal weakness or numbness.   ____________________________________________   PHYSICAL EXAM:  VITAL SIGNS: ED Triage Vitals  Enc Vitals Group     BP 02/13/21 1440 132/81     Pulse Rate 02/13/21 1440 100     Resp 02/13/21 1440 16     Temp 02/13/21 1440 (!) 96.8 F (36 C)     Temp Source 02/13/21 1440 Oral     SpO2 02/13/21 1440 99 %     Weight 02/13/21 1438 191 lb 12.8 oz (87 kg)     Height 02/13/21 1438 5\' 4"  (1.626 m)     Head Circumference --      Peak Flow --      Pain Score 02/13/21 1438 6     Pain Loc --      Pain Edu? --      Excl. in [place]? --    Constitutional: Alert and oriented. Well appearing and in no acute distress. Eyes: Conjunctivae are normal. PERRL. EOMI. Head: Obvious facial trauma with scratched glasses lens on left, abrasions to the central forehead, central bridge of nose as well as upper lip and chin.   No parietal, temporal or occipital trauma noted. Nose: Tender to palpate throughout, no active bleeding no congestion/rhinnorhea. Mouth/Throat: Mucous membranes are moist.   Neck: No stridor.  No tenderness to palpation of the midline or paraspinals of the cervical spine.  Full range of motion. Cardiovascular: Normal rate, regular rhythm. Grossly normal heart sounds.  Good peripheral circulation. Respiratory: Normal respiratory effort.  No retractions. Lungs CTAB. Gastrointestinal: Soft and nontender. No distention. No abdominal bruits. No CVA tenderness. Musculoskeletal: There is tenderness to palpation of the distal ulna on the right side.  All digits move freely and active range of motion, but with increased pain at gripping.  Radial pulse 2+.  No current soft tissue swelling or ecchymosis. Neurologic:  Normal speech and language. No gross focal neurologic deficits are appreciated.  Skin:  Skin is warm, dry and intact except abrasions as noted above. No rash noted. Psychiatric: Mood and affect are normal. Speech and behavior are normal.  ____________________________________________  RADIOLOGY I, Marlana Salvage, personally viewed and evaluated these images (plain radiographs) as part of my medical decision making, as well as reviewing the written report by the radiologist.  ED provider interpretation: No acute fracture identified of the right wrist; see radiology report for CT findings ____________________________________________   INITIAL IMPRESSION / ASSESSMENT AND PLAN / ED COURSE  As part of my medical decision making, I reviewed the following data within the [facility] notes reviewed and incorporated, Radiograph reviewed, Notes from prior ED visits and [place] Controlled Substance Database        Patient is a 65 year old female who presents to the emergency department after a fall in which she caught herself with her right wrist and face.  See HPI for further  details.  In triage, the patient has a mildly decreased temperature of 96.8, otherwise vitals are within normal limits.  On physical exam, the patient does have multiple noted abrasions to the face as well as some soft tissue swelling noted of the right wrist.  X-rays were obtained of the right wrist are negative for any acute fracture at this time.  CT scans were obtained of the head face and neck and are negative for acute injuries.  Will initiate Keflex for infection prophylaxis given the abrasions on the face.  Recommended wound cleaning and bacitracin application.  Reports Tdap is up-to-date.  We'll also prescribe meloxicam as well as short course of Norco for patient's injuries.  Recommended close follow-up with orthopedics if wrist is not improving in 1 to 2 weeks.  Patient is amenable with this plan, she stable this time for outpatient follow-up.  She'll return to the ER with any acute worsening.      ____________________________________________   FINAL CLINICAL IMPRESSION(S) / ED DIAGNOSES  Final diagnoses:  Fall, initial encounter  Facial abrasion, initial encounter  Right wrist pain     ED Discharge Orders         Ordered    HYDROcodone-acetaminophen (NORCO) 5-325 MG tablet  Every 4 hours PRN        02/13/21 1653    meloxicam (MOBIC) 15 MG tablet  Daily        02/13/21 1653    cephALEXin (KEFLEX) 500 MG capsule  2 times daily        02/13/21 1653          *Please note:  KYASIA STEUCK was evaluated in Emergency Department on 02/14/2021 for the symptoms described in the history of present illness. She was evaluated in the context of the global COVID-19 pandemic, which necessitated consideration that the patient might be at risk for infection with the SARS-CoV-2 virus that causes COVID-19. Institutional protocols and algorithms that pertain to the evaluation of patients at risk for COVID-19 are in a state of rapid change based on information released by regulatory bodies  including the CDC and federal and state organizations. These policies and algorithms were followed during the patient's care in the ED.  Some ED evaluations and interventions may be delayed as a result of limited staffing during and the pandemic.*   Note:  This document was prepared using Dragon voice recognition software and may include unintentional dictation errors.   Marlana Salvage, PA  02/14/21 1442    Blake Divine, MD 02/15/21 [phone]

## 2021-02-13 NOTE — ED Triage Notes (Signed)
Fall today, while holding grandchild.  [place] forward, hitting concrete.  No LOC.  Abrasions to forehead, bridge of nose.  Bleeding controlled.  C/O facial pain and right wrist pain.

## 2021-02-13 NOTE — ED Notes (Signed)
Provided DC instructions. Verbalized understanding. Alert, oriented, and ambulatory upon DC. No acute distress.

## 2021-02-13 NOTE — ED Notes (Signed)
See triage note  Presents s/p fall   Having pain to right wrist and face  [place] forward  Abrasion noted

## 2021-02-15 ENCOUNTER — Ambulatory Visit
Admission: RE | Admit: 2021-02-15 | Discharge: 2021-02-15 | Disposition: A | Discharge status: Home / Self Care | Payer: Medicare HMO | Source: Ambulatory Visit | Attending: Physician Assistant | Admitting: Physician Assistant

## 2021-02-15 ENCOUNTER — Other Ambulatory Visit: Payer: Self-pay

## 2021-02-15 DIAGNOSIS — M545 Low back pain, unspecified: Secondary | ICD-10-CM | POA: Insufficient documentation

## 2021-02-27 DIAGNOSIS — F4323 Adjustment disorder with mixed anxiety and depressed mood: Secondary | ICD-10-CM | POA: Diagnosis not present

## 2021-02-27 DIAGNOSIS — F5105 Insomnia due to other mental disorder: Secondary | ICD-10-CM | POA: Diagnosis not present

## 2021-03-09 DIAGNOSIS — H524 Presbyopia: Secondary | ICD-10-CM | POA: Diagnosis not present

## 2021-03-10 ENCOUNTER — Other Ambulatory Visit: Payer: Self-pay | Admitting: Family Medicine

## 2021-04-10 ENCOUNTER — Other Ambulatory Visit: Payer: Self-pay | Admitting: Family Medicine

## 2021-04-10 DIAGNOSIS — I10 Essential (primary) hypertension: Secondary | ICD-10-CM

## 2021-05-09 ENCOUNTER — Ambulatory Visit: Payer: Self-pay

## 2021-05-09 NOTE — Telephone Encounter (Signed)
There is an available appointment on Dr. Sharmaine Base schedule this Friday 05/12/2021 at 8:40am. I placed patient on her schedule so that the slot did not get filled. I tried calling patient to confirm that patient is able to come into the office at that time. Left message to call back. OK for [facility] triage to advise patient that appointment has been moved up to 05/12/2021 with Dr. B at 8:40am, and to confirm that patient is able to come in at that time.

## 2021-05-09 NOTE — Telephone Encounter (Signed)
Pt. Reports she started having palpitations x 2 months ago. Reports they are becoming more frequent. Last 10-15 minutes. Sometimes has dizziness with this.No chest pain. Appointment made for 05/25/21. Instructed to go to ED for worsening of symptoms.Pt. Would like to be seen sooner if possible. Reason for Disposition . [1] Palpitations AND [2] no improvement after using CARE ADVICE  Answer Assessment - Initial Assessment Questions 1. DESCRIPTION: "Please describe your heart rate or heartbeat that you are having" (e.g., fast/slow, regular/irregular, skipped or extra beats, "palpitations")     Palpitations 2. ONSET: "When did it start?" (Minutes, hours or days)      Started x 2 months ago 3. DURATION: "How long does it last" (e.g., seconds, minutes, hours)     10-15 MINUTES 4. PATTERN "Does it come and go, or has it been constant since it started?"  "Does it get worse with exertion?"   "Are you feeling it now?"     Comes and goes 5. TAP: "Using your hand, can you tap out what you are feeling on a chair or table in front of you, so that I can hear?" (Note: not all patients can do this)       No 6. HEART RATE: "Can you tell me your heart rate?" "How many beats in 15 seconds?"  (Note: not all patients can do this)       80 7. RECURRENT SYMPTOM: "Have you ever had this before?" If Yes, ask: "When was the last time?" and "What happened that time?"      No 8. CAUSE: "What do you think is causing the palpitations?"     Unsure 9. CARDIAC HISTORY: "Do you have any history of heart disease?" (e.g., heart attack, angina, bypass surgery, angioplasty, arrhythmia)      No 10. OTHER SYMPTOMS: "Do you have any other symptoms?" (e.g., dizziness, chest pain, sweating, difficulty breathing)       Dizzy 11. PREGNANCY: "Is there any chance you are pregnant?" "When was your last menstrual period?"       No  Protocols used: HEART RATE AND HEARTBEAT QUESTIONS-A-AH

## 2021-05-12 ENCOUNTER — Other Ambulatory Visit: Payer: Self-pay

## 2021-05-12 ENCOUNTER — Ambulatory Visit (INDEPENDENT_AMBULATORY_CARE_PROVIDER_SITE_OTHER): Payer: Medicare HMO | Admitting: Family Medicine

## 2021-05-12 ENCOUNTER — Encounter: Payer: Self-pay | Admitting: Family Medicine

## 2021-05-12 VITALS — BP 130/68 | HR 70 | Temp 98.8°F | Resp 16 | Ht 64.0 in | Wt 195.7 lb

## 2021-05-12 DIAGNOSIS — R002 Palpitations: Secondary | ICD-10-CM | POA: Diagnosis not present

## 2021-05-12 NOTE — Progress Notes (Signed)
Established patient visit   Patient: [name] [name]   DOB: [birth year]   65 y.o. Female  MRN: [id number] Visit Date: 05/12/2021  Today's healthcare provider: Lavon Paganini, MD   Chief Complaint  Patient presents with  . Palpitations   Subjective    Palpitations  This is a new problem. The current episode started 1 to 4 weeks ago. The problem occurs intermittently. The problem has been gradually worsening. On average, each episode lasts 15 minutes. Nothing aggravates the symptoms. Associated symptoms include diaphoresis, dizziness and an irregular heartbeat. Pertinent negatives include no chest pain, coughing, fever, malaise/fatigue, nausea, shortness of breath or vomiting. She has tried breathing exercises and deep relaxation for the symptoms. The treatment provided moderate relief. Risk factors include obesity.    Family history She has a family history of Atrial Fibrillation on her paternal side. She states that her parents, siblings, and a cousin have all died before the age of 41. She reports that most have died in between the 67-60 age.        Patient Active Problem List   Diagnosis Date Noted  . Palpitations 05/12/2021  . Prediabetes 04/01/2019  . Depression 04/01/2019  . Hypertension 02/19/2017  . Hypertriglyceridemia 06/25/2016  . Osteopenia 06/25/2016  . Buzzing in ear 06/25/2016  . Avitaminosis D 06/25/2016  . Cannot sleep 03/28/2007   Social History   Tobacco Use  . Smoking status: Never Smoker  . Smokeless tobacco: Never Used  Vaping Use  . Vaping Use: Never used  Substance Use Topics  . Alcohol use: No  . Drug use: No   Allergies  Allergen Reactions  . Oxycodone-Acetaminophen Other (See Comments)    Made her feel like "she was losing her mind". Unsure if the reaction was due to percocet or stress at the time.       Medications: Outpatient Medications Prior to Visit  Medication Sig  . calcium-vitamin D (OSCAL WITH D) 500-200 MG-UNIT  tablet Take 1 tablet by mouth See admin instructions. Take 2 tablets by mouth daily in the morning and 1 tablet in the evening  . hydrochlorothiazide (HYDRODIURIL) 12.5 MG tablet TAKE ONE TABLET EVERY DAY WITH LOSARTAN 50MG   . losartan (COZAAR) 50 MG tablet TAKE ONE TABLET EVERY DAY WITH HYDROCHLOROTHIAZIDE 12.5MG  TAB  . Multiple Vitamin (MULTIVITAMIN) capsule Take 1 capsule by mouth daily.   . niacin 500 MG tablet Take 500 mg by mouth 2 (two) times daily with a meal.   . sertraline (ZOLOFT) 50 MG tablet Take 1 tablet (50 mg total) by mouth every morning.  . traZODone (DESYREL) 100 MG tablet Take 1 tablet (100 mg total) by mouth at bedtime as needed for sleep.  . [DISCONTINUED] HYDROcodone-acetaminophen (NORCO) 5-325 MG tablet Take 1 tablet by mouth every 4 (four) hours as needed for moderate pain.   No facility-administered medications prior to visit.    Review of Systems  Constitutional: Positive for diaphoresis. Negative for chills, fatigue, fever and malaise/fatigue.  HENT: Negative for congestion, ear pain, rhinorrhea, sinus pain and sore throat.   Respiratory: Negative for cough, shortness of breath and wheezing.   Cardiovascular: Positive for palpitations. Negative for chest pain and leg swelling.  Gastrointestinal: Negative for abdominal pain, blood in stool, diarrhea, nausea and vomiting.  Genitourinary: Negative for dysuria, flank pain, frequency and urgency.  Neurological: Positive for dizziness. Negative for headaches.    Last CBC Lab Results  Component Value Date   WBC 5.9 11/29/2020   HGB 13.7 11/29/2020  HCT 42.4 11/29/2020   MCV 88 11/29/2020   MCH 28.5 11/29/2020   RDW 13.3 11/29/2020   PLT 295 09/98/3382   Last metabolic panel Lab Results  Component Value Date   GLUCOSE 85 11/29/2020   NA 145 (H) 11/29/2020   K 4.3 11/29/2020   CL 103 11/29/2020   CO2 28 11/29/2020   BUN 15 11/29/2020   CREATININE 1.03 (H) 11/29/2020   GFRNONAA 57 (L) 11/29/2020   GFRAA  66 11/29/2020   CALCIUM 9.7 11/29/2020   PROT 7.4 11/29/2020   ALBUMIN 4.4 11/29/2020   LABGLOB 3.0 11/29/2020   AGRATIO 1.5 11/29/2020   BILITOT 0.3 11/29/2020   ALKPHOS 90 11/29/2020   AST 24 11/29/2020   ALT 22 11/29/2020   ANIONGAP 6 08/28/2018   Last lipids Lab Results  Component Value Date   CHOL 215 (H) 11/29/2020   HDL 51 11/29/2020   LDLCALC 138 (H) 11/29/2020   TRIG 146 11/29/2020   CHOLHDL 4.2 11/29/2020   Last hemoglobin A1c Lab Results  Component Value Date   HGBA1C 5.9 (H) 11/29/2020   Last thyroid functions Lab Results  Component Value Date   TSH 2.070 11/29/2020        Objective    BP 130/68 (BP Location: Right Arm, Patient Position: Sitting, Cuff Size: Large)   Pulse 70   Temp 98.8 F (37.1 C) (Oral)   Resp 16   Ht 5\' 4"  (1.626 m)   Wt 195 lb 11.2 oz (88.8 kg)   SpO2 98%   BMI 33.59 kg/m  BP Readings from Last 3 Encounters:  05/12/21 130/68  02/13/21 132/81  11/29/20 121/72   Wt Readings from Last 3 Encounters:  05/12/21 195 lb 11.2 oz (88.8 kg)  02/13/21 191 lb 12.8 oz (87 kg)  08/24/20 191 lb 12.8 oz (87 kg)      Physical Exam Vitals reviewed.  Constitutional:      General: She is not in acute distress.    Appearance: Normal appearance. She is well-developed. She is not diaphoretic.  HENT:     Head: Normocephalic and atraumatic.  Eyes:     General: No scleral icterus.    Conjunctiva/sclera: Conjunctivae normal.  Neck:     Thyroid: No thyromegaly.  Cardiovascular:     Rate and Rhythm: Normal rate and regular rhythm.     Pulses: Normal pulses.     Heart sounds: Normal heart sounds. No murmur heard.   Pulmonary:     Effort: Pulmonary effort is normal. No respiratory distress.     Breath sounds: Normal breath sounds. No wheezing, rhonchi or rales.  Musculoskeletal:     Cervical back: Neck supple.     Right lower leg: No edema.     Left lower leg: No edema.  Lymphadenopathy:     Cervical: No cervical adenopathy.   Skin:    General: Skin is warm and dry.     Findings: No rash.  Neurological:     Mental Status: She is alert and oriented to person, place, and time. Mental status is at baseline.  Psychiatric:        Mood and Affect: Mood normal.        Behavior: Behavior normal.       No results found for any visits on 05/12/21.  Assessment & Plan     Problem List Items Addressed This Visit      Other   Palpitations - Primary    New problem Increasing in severity and  duration Family history is concerning for arrhythmias Concern for possible paroxysmal A. fib in this patient EKG is benign today-reviewed with patient Check CBC, CMP, TSH Zio patch x14 days Management pending results      Relevant Orders   EKG 12-Lead (Completed)   CBC w/Diff/Platelet   Comprehensive metabolic panel   TSH   LONG TERM MONITOR (3-14 DAYS)       Return if symptoms worsen or fail to improve.       Frederic Jericho Moorehead,acting as a Education administrator for Lavon Paganini, MD.,have documented all relevant documentation on the behalf of Lavon Paganini, MD,as directed by  Lavon Paganini, MD while in the presence of Lavon Paganini, MD.  I, Lavon Paganini, MD, have reviewed all documentation for this visit. The documentation on 05/12/21 for the exam, diagnosis, procedures, and orders are all accurate and complete.   Lucinda Spells, Dionne Bucy, MD, MPH [facility]

## 2021-05-12 NOTE — Assessment & Plan Note (Signed)
New problem Increasing in severity and duration Family history is concerning for arrhythmias Concern for possible paroxysmal A. fib in this patient EKG is benign today-reviewed with patient Check CBC, CMP, TSH Zio patch x14 days Management pending results

## 2021-05-13 LAB — CBC WITH DIFFERENTIAL/PLATELET
Basophils Absolute: 0 10*3/uL (ref 0.0–0.2)
Basos: 1 %
EOS (ABSOLUTE): 0.2 10*3/uL (ref 0.0–0.4)
Eos: 3 %
Hematocrit: 41.3 % (ref 34.0–46.6)
Hemoglobin: 13.4 g/dL (ref 11.1–15.9)
Immature Grans (Abs): 0 10*3/uL (ref 0.0–0.1)
Immature Granulocytes: 0 %
Lymphocytes Absolute: 1.1 10*3/uL (ref 0.7–3.1)
Lymphs: 18 %
MCH: 28.9 pg (ref 26.6–33.0)
MCHC: 32.4 g/dL (ref 31.5–35.7)
MCV: 89 fL (ref 79–97)
Monocytes Absolute: 0.5 10*3/uL (ref 0.1–0.9)
Monocytes: 8 %
Neutrophils Absolute: 4.1 10*3/uL (ref 1.4–7.0)
Neutrophils: 70 %
Platelets: 287 10*3/uL (ref 150–450)
RBC: 4.63 x10E6/uL (ref 3.77–5.28)
RDW: 14 % (ref 11.7–15.4)
WBC: 5.9 10*3/uL (ref 3.4–10.8)

## 2021-05-13 LAB — COMPREHENSIVE METABOLIC PANEL
ALT: 20 IU/L (ref 0–32)
AST: 27 IU/L (ref 0–40)
Albumin/Globulin Ratio: 1.4 (ref 1.2–2.2)
Albumin: 4.2 g/dL (ref 3.8–4.8)
Alkaline Phosphatase: 87 IU/L (ref 44–121)
BUN/Creatinine Ratio: 14 (ref 12–28)
BUN: 14 mg/dL (ref 8–27)
Bilirubin Total: 0.4 mg/dL (ref 0.0–1.2)
CO2: 24 mmol/L (ref 20–29)
Calcium: 9.8 mg/dL (ref 8.7–10.3)
Chloride: 103 mmol/L (ref 96–106)
Creatinine, Ser: 0.97 mg/dL (ref 0.57–1.00)
Globulin, Total: 2.9 g/dL (ref 1.5–4.5)
Glucose: 102 mg/dL — ABNORMAL HIGH (ref 65–99)
Potassium: 4.2 mmol/L (ref 3.5–5.2)
Sodium: 141 mmol/L (ref 134–144)
Total Protein: 7.1 g/dL (ref 6.0–8.5)
eGFR: 65 mL/min/{1.73_m2} (ref >59)

## 2021-05-13 LAB — TSH: TSH: 1.89 u[IU]/mL (ref 0.450–4.500)

## 2021-05-25 ENCOUNTER — Ambulatory Visit: Payer: Self-pay | Admitting: Family Medicine

## 2021-05-30 ENCOUNTER — Ambulatory Visit: Payer: Self-pay | Admitting: Physician Assistant

## 2021-05-30 DIAGNOSIS — F5105 Insomnia due to other mental disorder: Secondary | ICD-10-CM | POA: Diagnosis not present

## 2021-05-30 DIAGNOSIS — F4323 Adjustment disorder with mixed anxiety and depressed mood: Secondary | ICD-10-CM | POA: Diagnosis not present

## 2021-06-07 DIAGNOSIS — R002 Palpitations: Secondary | ICD-10-CM | POA: Diagnosis not present

## 2021-06-16 ENCOUNTER — Telehealth (INDEPENDENT_AMBULATORY_CARE_PROVIDER_SITE_OTHER): Payer: Medicare HMO | Admitting: Family Medicine

## 2021-06-16 DIAGNOSIS — I471 Supraventricular tachycardia: Secondary | ICD-10-CM | POA: Diagnosis not present

## 2021-06-16 NOTE — Telephone Encounter (Signed)
Patient Zio patch results from 05/12/2021 to 05/24/2021 received and reviewed by me.  The results are as follows.  Patient had a minimum heart rate of 48 bpm, max heart rate of 197, and average heart rate of 73.  Predominant underlying rhythm was sinus rhythm.  2 ventricular tachycardia runs occurred, the run with the fastest interval lasting 5 beats with a max rate of 150 bpm/average 123 bpm; the run with the fastest interval was also the longest.  28 supraventricular tachycardia runs occurred.  The run with the fastest interval lasting 22 seconds with a max rate of 197 bpm/average 127 bpm.  The run with the fastest interval was also the longest.  True duration of supraventricular tachycardia difficult to ascertain due to artifact.  Isolated SVE's were rare less than 1%, SVE couplets were rare less than 1%, and SVE triplets were rare less than 1%.  Isolated VE's were rare less than 1%, VE couplets were rare less than 1%, and no VE triplets were present.  Overall, this reflects intermittent supraventricular tachycardia.  This is also called PSVT.  This is a common cause of palpitations and likely the cause of the symptoms that she is feeling.  If she is still symptomatic, we can start metoprolol ER 25 mg daily to help with symptoms.  If she starts the medication, I would like to see her back in 1 month to reassess how the medicine is improving her symptoms.  Okay to send in the medication if she agrees for a 30-day supply with 1 refill.  Eden Toohey, Dionne Bucy, MD, MPH [facility]

## 2021-06-16 NOTE — Telephone Encounter (Signed)
Patient was advised she states that she will think over the weekend if she wants to start Metoprolol and will call us back if she decides that she wants to start next week. KW

## 2021-06-20 ENCOUNTER — Other Ambulatory Visit: Payer: Self-pay | Admitting: Family Medicine

## 2021-06-20 DIAGNOSIS — I10 Essential (primary) hypertension: Secondary | ICD-10-CM

## 2021-07-13 ENCOUNTER — Ambulatory Visit (INDEPENDENT_AMBULATORY_CARE_PROVIDER_SITE_OTHER): Payer: Medicare HMO | Admitting: Family Medicine

## 2021-07-13 ENCOUNTER — Encounter: Payer: Self-pay | Admitting: Family Medicine

## 2021-07-13 ENCOUNTER — Other Ambulatory Visit: Payer: Self-pay

## 2021-07-13 VITALS — BP 118/65 | HR 70 | Temp 98.0°F | Resp 16 | Ht 64.5 in | Wt 195.7 lb

## 2021-07-13 DIAGNOSIS — I10 Essential (primary) hypertension: Secondary | ICD-10-CM

## 2021-07-13 DIAGNOSIS — I471 Supraventricular tachycardia: Secondary | ICD-10-CM

## 2021-07-13 DIAGNOSIS — E781 Pure hyperglyceridemia: Secondary | ICD-10-CM

## 2021-07-13 DIAGNOSIS — R7303 Prediabetes: Secondary | ICD-10-CM

## 2021-07-13 LAB — POCT GLYCOSYLATED HEMOGLOBIN (HGB A1C)
Estimated Average Glucose: 117
Hemoglobin A1C: 5.7 % — AB (ref 4.0–5.6)

## 2021-07-13 MED ORDER — HYDROCHLOROTHIAZIDE 12.5 MG PO TABS: 12.5000 mg | ORAL_TABLET | Freq: Every day | ORAL | 1 refills | Status: DC

## 2021-07-13 NOTE — Assessment & Plan Note (Signed)
Reviewed last lipids Repeat at next visit

## 2021-07-13 NOTE — Assessment & Plan Note (Signed)
Well controlled Continue current medications Reviewed recent metabolic panel F/u in 6 months  

## 2021-07-13 NOTE — Assessment & Plan Note (Signed)
Improving Recommend low carb diet

## 2021-07-13 NOTE — Assessment & Plan Note (Signed)
Asymptomatic Cut back significantly on caffeine No BB at this time Continue to monitor

## 2021-07-13 NOTE — Progress Notes (Signed)
Established patient visit   Patient: [name] [name]   DOB: [birth year]   65 y.o. Female  MRN: [id number] Visit Date: 07/13/2021  Today's healthcare provider: Lavon Paganini, MD   Chief Complaint  Patient presents with   Hypertension   Hyperglycemia     Subjective    Hypertension Pertinent negatives include no chest pain, headaches, neck pain, palpitations or shortness of breath.  Hyperglycemia Pertinent negatives include no abdominal pain, chest pain, chills, coughing, fatigue, fever, headaches, myalgias, nausea, neck pain, numbness, sore throat, vomiting or weakness.   Prediabetes, Follow-up  Lab Results  Component Value Date   HGBA1C 5.9 (H) 11/29/2020   HGBA1C 5.8 (H) 11/05/2019   HGBA1C 5.7 (H) 10/21/2018   GLUCOSE 102 (H) 05/12/2021   GLUCOSE 85 11/29/2020   GLUCOSE 92 11/05/2019    Last seen for for this 6 months ago.  Management since that visit includes no changes Current symptoms include none and have been stable.  Prior visit with dietician: no Current diet: in general, a "healthy" diet   Current exercise: none  Pertinent Labs:    Component Value Date/Time   CHOL 215 (H) 11/29/2020 1450   CHOL 185 11/10/2013 0832   TRIG 146 11/29/2020 1450   TRIG 121 11/10/2013 0832   CHOLHDL 4.2 11/29/2020 1450   CREATININE 0.97 05/12/2021 0937   CREATININE 0.95 01/11/2015 2150    Wt Readings from Last 3 Encounters:  07/13/21 195 lb 11.2 oz (88.8 kg)  05/12/21 195 lb 11.2 oz (88.8 kg)  02/13/21 191 lb 12.8 oz (87 kg)   ----------------------------------------------------------------------------------------- Hypertension, follow-up  BP Readings from Last 3 Encounters:  07/13/21 118/65  05/12/21 130/68  02/13/21 132/81   Wt Readings from Last 3 Encounters:  07/13/21 195 lb 11.2 oz (88.8 kg)  05/12/21 195 lb 11.2 oz (88.8 kg)  02/13/21 191 lb 12.8 oz (87 kg)     She was last seen for hypertension 6 months ago.  BP at that visit was 121/72.  Management since that visit includes no changes.  She reports good compliance with treatment. She is not having side effects.  She is following a Regular, Low Sodium diet. She is not exercising. She does not smoke.  In the mornings she will feel palpitations or a sense of anxiousness. She used to be a frequent drinker of coke and tea. However she has limited her caffeine intake to help improve her symptoms after her diagnosis of PSVT. She prefers not take any additional medication and just continue to make lifestyle changes.   She is requesting for a refill on 12.5 mg HCTZ    Use of agents associated with hypertension: none.   Outside blood pressures are stable Symptoms: NOchest pain No chest pressure  No palpitations No syncope  No dyspnea No orthopnea  No paroxysmal nocturnal dyspnea No lower extremity edema   Pertinent labs: Lab Results  Component Value Date   CHOL 215 (H) 11/29/2020   HDL 51 11/29/2020   LDLCALC 138 (H) 11/29/2020   TRIG 146 11/29/2020   CHOLHDL 4.2 11/29/2020   Lab Results  Component Value Date   NA 141 05/12/2021   K 4.2 05/12/2021   CREATININE 0.97 05/12/2021   GFRNONAA 57 (L) 11/29/2020   GFRAA 66 11/29/2020   GLUCOSE 102 (H) 05/12/2021     The 10-year ASCVD risk score Mikey Bussing DC Jr., et al., 2013) is: 6.8%   ---------------------------------------------------------------------------------------------------   Patient Active Problem List   Diagnosis Date Noted  PSVT (paroxysmal supraventricular tachycardia) ([place]) 06/16/2021   Palpitations 05/12/2021   Prediabetes 04/01/2019   Depression 04/01/2019   Hypertension 02/19/2017   Hypertriglyceridemia 06/25/2016   Osteopenia 06/25/2016   Buzzing in ear 06/25/2016   Avitaminosis D 06/25/2016   Cannot sleep 03/28/2007   Past Medical History:  Diagnosis Date   Anxiety    not an issue anymore. this was due to broken ankle   Arthritis    BACK   Complication of anesthesia    SLOW TO WAKE UP  X 1 WITH ANKLE SURGERY   GERD (gastroesophageal reflux disease)    OCC-NO MEDS   History of kidney stones    Hypertension 02/19/2017   Hypertriglyceridemia 06/25/2016   Osteopenia 06/25/2016   PONV (postoperative nausea and vomiting)    Allergies  Allergen Reactions   Oxycodone-Acetaminophen Other (See Comments)    Made her feel like "she was losing her mind". Unsure if the reaction was due to percocet or stress at the time.     Medications: Outpatient Medications Prior to Visit  Medication Sig   calcium-vitamin D (OSCAL WITH D) 500-200 MG-UNIT tablet Take 1 tablet by mouth See admin instructions. Take 2 tablets by mouth daily in the morning and 1 tablet in the evening   hydrochlorothiazide (HYDRODIURIL) 12.5 MG tablet TAKE ONE TABLET EVERY DAY WITH LOSARTAN 50MG    losartan (COZAAR) 50 MG tablet TAKE 1 TABLET DAILY WITH HYDROCHLOROTHIAZIDE 12.5MG  TAB   Multiple Vitamin (MULTIVITAMIN) capsule Take 1 capsule by mouth daily.    niacin 500 MG tablet Take 500 mg by mouth 2 (two) times daily with a meal.    sertraline (ZOLOFT) 50 MG tablet Take 1 tablet (50 mg total) by mouth every morning.   traZODone (DESYREL) 100 MG tablet Take 1 tablet (100 mg total) by mouth at bedtime as needed for sleep.   No facility-administered medications prior to visit.    Review of Systems  Constitutional:  Negative for chills, fatigue and fever.  HENT:  Negative for ear pain, sinus pressure, sinus pain and sore throat.   Eyes:  Negative for pain and visual disturbance.  Respiratory:  Negative for cough, chest tightness, shortness of breath and wheezing.   Cardiovascular:  Negative for chest pain, palpitations and leg swelling.  Gastrointestinal:  Negative for abdominal pain, blood in stool, diarrhea, nausea and vomiting.  Genitourinary:  Negative for flank pain, frequency, pelvic pain and urgency.  Musculoskeletal:  Negative for back pain, myalgias and neck pain.  Neurological:  Negative for dizziness,  weakness, light-headedness, numbness and headaches.   Last CBC Lab Results  Component Value Date   WBC 5.9 05/12/2021   HGB 13.4 05/12/2021   HCT 41.3 05/12/2021   MCV 89 05/12/2021   MCH 28.9 05/12/2021   RDW 14.0 05/12/2021   PLT 287 19/50/9326   Last metabolic panel Lab Results  Component Value Date   GLUCOSE 102 (H) 05/12/2021   NA 141 05/12/2021   K 4.2 05/12/2021   CL 103 05/12/2021   CO2 24 05/12/2021   BUN 14 05/12/2021   CREATININE 0.97 05/12/2021   GFRNONAA 57 (L) 11/29/2020   GFRAA 66 11/29/2020   CALCIUM 9.8 05/12/2021   PROT 7.1 05/12/2021   ALBUMIN 4.2 05/12/2021   LABGLOB 2.9 05/12/2021   AGRATIO 1.4 05/12/2021   BILITOT 0.4 05/12/2021   ALKPHOS 87 05/12/2021   AST 27 05/12/2021   ALT 20 05/12/2021   ANIONGAP 6 08/28/2018   Last lipids Lab Results  Component Value Date  CHOL 215 (H) 11/29/2020   HDL 51 11/29/2020   LDLCALC 138 (H) 11/29/2020   TRIG 146 11/29/2020   CHOLHDL 4.2 11/29/2020   Last hemoglobin A1c Lab Results  Component Value Date   HGBA1C 5.9 (H) 11/29/2020   Last thyroid functions Lab Results  Component Value Date   TSH 1.890 05/12/2021        Objective    There were no vitals taken for this visit. BP Readings from Last 3 Encounters:  05/12/21 130/68  02/13/21 132/81  11/29/20 121/72    Wt Readings from Last 3 Encounters:  05/12/21 195 lb 11.2 oz (88.8 kg)  02/13/21 191 lb 12.8 oz (87 kg)  08/24/20 191 lb 12.8 oz (87 kg)       Physical Exam Vitals reviewed.  Constitutional:      General: She is not in acute distress.    Appearance: Normal appearance. She is well-developed. She is not diaphoretic.  HENT:     Head: Normocephalic and atraumatic.  Eyes:     General: No scleral icterus.    Conjunctiva/sclera: Conjunctivae normal.  Neck:     Thyroid: No thyromegaly.  Cardiovascular:     Rate and Rhythm: Normal rate and regular rhythm.     Pulses: Normal pulses.     Heart sounds: Normal heart sounds. No  murmur heard. Pulmonary:     Effort: Pulmonary effort is normal. No respiratory distress.     Breath sounds: Normal breath sounds. No wheezing, rhonchi or rales.  Musculoskeletal:     Cervical back: Neck supple.     Right lower leg: No edema.     Left lower leg: No edema.  Lymphadenopathy:     Cervical: No cervical adenopathy.  Skin:    General: Skin is warm and dry.     Findings: No rash.  Neurological:     Mental Status: She is alert and oriented to person, place, and time. Mental status is at baseline.  Psychiatric:        Mood and Affect: Mood normal.        Behavior: Behavior normal.     No results found for any visits on 07/13/21.  Assessment & Plan     Problem List Items Addressed This Visit       Cardiovascular and Mediastinum   Hypertension    Well controlled Continue current medications Reviewed recent metabolic panel F/u in 6 months        Relevant Medications   hydrochlorothiazide (HYDRODIURIL) 12.5 MG tablet   PSVT (paroxysmal supraventricular tachycardia) ([facility])    Asymptomatic Cut back significantly on caffeine No BB at this time Continue to monitor       Relevant Medications   hydrochlorothiazide (HYDRODIURIL) 12.5 MG tablet     Other   Hypertriglyceridemia    Reviewed last lipids Repeat at next visit       Relevant Medications   hydrochlorothiazide (HYDRODIURIL) 12.5 MG tablet   Prediabetes - Primary    Improving Recommend low carb diet       Relevant Orders   POCT HgB A1C (Completed)     Return in about 6 months (around 01/13/2022) for CPE.      I,Essence Turner,acting as a Education administrator for Lavon Paganini, MD.,have documented all relevant documentation on the behalf of Lavon Paganini, MD,as directed by  Lavon Paganini, MD while in the presence of Lavon Paganini, MD.  I, Lavon Paganini, MD, have reviewed all documentation for this visit. The documentation on 07/13/21 for the  exam, diagnosis, procedures, and orders are  all accurate and complete.   Yen Wandell, Dionne Bucy, MD, MPH [facility]

## 2021-07-24 ENCOUNTER — Ambulatory Visit: Payer: Self-pay

## 2021-07-24 NOTE — Telephone Encounter (Signed)
Patient called and says she tested positive for COVID on Monday, 07/17/21. She says her symptoms of cough, fatigue, and sore throat started on Saturday and Sunday before. She says she's not getting any better. She has sore throat, fatigue, cough (she said nasty cough), body aches, chills, low grade temp 99, and headache. She says she's taking Motrin about every 6 hours. She denies SOB, no other symptoms. I advised she will need a MyChart video visit, advised no availability with PCP. I called the office and spoke to [place], [facility] who says she can schedule patient for tomorrow at 1040 for a virtual with Tally Joe, [name], patient advised and agrees. Carol scheduled. Patient advised a MyChart video visit is scheduled, care advice given, she verbalized understanding.    Summary: Covid questions   Pt stated she tested positive for COVID last Monday the 25th, pt states she does not feel any better and wanted to know what her options were. Please advise.      Reason for Disposition  [1] HIGH RISK for severe COVID complications (e.g., weak immune system, age > 56 years, obesity with BMI > 25, pregnant, chronic lung disease or other chronic medical condition) AND [2] COVID symptoms (e.g., cough, fever)  (Exceptions: Already seen by PCP and no new or worsening symptoms.)  Answer Assessment - Initial Assessment Questions 1. COVID-19 DIAGNOSIS: "Who made your COVID-19 diagnosis?" "Was it confirmed by a positive lab test or self-test?" If not diagnosed by a doctor (or NP/PA), ask "Are there lots of cases (community spread) where you live?" Note: See public health department website, if unsure.     2 positive home tests on 07/17/21 2. COVID-19 EXPOSURE: "Was there any known exposure to COVID before the symptoms began?" CDC Definition of close contact: within 6 feet (2 meters) for a total of 15 minutes or more over a 24-hour period.      No known exposure 3. ONSET: "When did the COVID-19 symptoms start?"      Saturday  07/15/21 started with cough, fatigue 4. WORST SYMPTOM: "What is your worst symptom?" (e.g., cough, fever, shortness of breath, muscle aches)     Cough, fatigue, headache 5. COUGH: "Do you have a cough?" If Yes, ask: "How bad is the cough?"       Yes, nasty sounding 6. FEVER: "Do you have a fever?" If Yes, ask: "What is your temperature, how was it measured, and when did it start?"     No, 99.0 earlier today 7. RESPIRATORY STATUS: "Describe your breathing?" (e.g., shortness of breath, wheezing, unable to speak)      No 8. BETTER-SAME-WORSE: "Are you getting better, staying the same or getting worse compared to yesterday?"  If getting worse, ask, "In what way?"     Same 9. HIGH RISK DISEASE: "Do you have any chronic medical problems?" (e.g., asthma, heart or lung disease, weak immune system, obesity, etc.)     HTN 10. VACCINE: "Have you had the COVID-19 vaccine?" If Yes, ask: "Which one, how many shots, when did you get it?"       Yes 2 Pfizer last year 29. BOOSTER: "Have you received your COVID-19 booster?" If Yes, ask: "Which one and when did you get it?"       2 boosters Pfizer 12. PREGNANCY: "Is there any chance you are pregnant?" "When was your last menstrual period?"       No 13. OTHER SYMPTOMS: "Do you have any other symptoms?"  (e.g., chills, fatigue, headache, loss of  smell or taste, muscle pain, sore throat)       Fatigue, headache, body aches, sore throat 14. O2 SATURATION MONITOR:  "Do you use an oxygen saturation monitor (pulse oximeter) at home?" If Yes, ask "What is your reading (oxygen level) today?" "What is your usual oxygen saturation reading?" (e.g., 95%)       Yes 98%  Protocols used: Coronavirus (COVID-19) Diagnosed or Suspected-A-AH

## 2021-07-25 ENCOUNTER — Encounter: Payer: Self-pay | Admitting: Family Medicine

## 2021-07-25 ENCOUNTER — Other Ambulatory Visit: Payer: Self-pay

## 2021-07-25 ENCOUNTER — Telehealth (INDEPENDENT_AMBULATORY_CARE_PROVIDER_SITE_OTHER): Payer: Medicare HMO | Admitting: Family Medicine

## 2021-07-25 DIAGNOSIS — R5383 Other fatigue: Secondary | ICD-10-CM | POA: Diagnosis not present

## 2021-07-25 DIAGNOSIS — J028 Acute pharyngitis due to other specified organisms: Secondary | ICD-10-CM

## 2021-07-25 DIAGNOSIS — I10 Essential (primary) hypertension: Secondary | ICD-10-CM

## 2021-07-25 DIAGNOSIS — R053 Chronic cough: Secondary | ICD-10-CM | POA: Insufficient documentation

## 2021-07-25 DIAGNOSIS — B9789 Other viral agents as the cause of diseases classified elsewhere: Secondary | ICD-10-CM

## 2021-07-25 DIAGNOSIS — T50B95A Adverse effect of other viral vaccines, initial encounter: Secondary | ICD-10-CM

## 2021-07-25 DIAGNOSIS — R058 Other specified cough: Secondary | ICD-10-CM

## 2021-07-25 DIAGNOSIS — U099 Post covid-19 condition, unspecified: Secondary | ICD-10-CM | POA: Insufficient documentation

## 2021-07-25 MED ORDER — GUAIFENESIN 400 MG PO TABS: 400.0000 mg | ORAL_TABLET | ORAL | 0 refills | Status: AC | PRN

## 2021-07-25 MED ORDER — FLUTICASONE PROPIONATE 50 MCG/ACT NA SUSP: 2.0000 | Freq: Every day | NASAL | 1 refills | Status: DC

## 2021-07-25 MED ORDER — BENZONATATE 100 MG PO CAPS: 200.0000 mg | ORAL_CAPSULE | Freq: Three times a day (TID) | ORAL | 0 refills | Status: DC | PRN

## 2021-07-25 MED ORDER — DEXTROMETHORPHAN HBR 10 MG/15ML PO SYRP
20.0000 mg | ORAL_SOLUTION | ORAL | 0 refills | Status: DC | PRN
Start: 2021-07-25 — End: 2022-01-18

## 2021-07-25 NOTE — Assessment & Plan Note (Signed)
Pt able to participate in ADLs  Still caring for family member  Sleep limited d/t headache, cough, and caregiver status  Generalized fatigue- advised, small frequent meals and resting when able

## 2021-07-25 NOTE — Assessment & Plan Note (Addendum)
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

## 2021-07-25 NOTE — Assessment & Plan Note (Signed)
Normal results per pt, has been checking at home  No difficulty with PO intake; continue to monitor risk for dehydration if this changes

## 2021-07-25 NOTE — Assessment & Plan Note (Deleted)
Primary complaint of COVID symptoms  Non-opoid cough medication for symptomatic relief provided  Discussed importance of fluids to assist with symptom relief

## 2021-07-25 NOTE — Progress Notes (Signed)
MyChart Video Visit      Virtual Visit via Video Note   This visit type was conducted due to national recommendations for restrictions regarding the COVID-19 Pandemic (e.g. social distancing) in an effort to limit this patient's exposure and mitigate transmission in our community. This patient is at least at moderate risk for complications without adequate follow up. This format is felt to be most appropriate for this patient at this time. Physical exam was limited by quality of the video and audio technology used for the visit.   Patient location: home Provider location: [facility]  Video attempt failed and NP and patient finished visit via secure call.  Majority of portion of call was over phone- billed to reflect call d/t failed video connection.  Patient: [name] [name]   DOB: May 22, 1956   65 y.o. Female  MRN: [id number] Visit Date: 07/25/2021  Today's Provider: Gwyneth Sprout, [name]  Subjective:    Chief Complaint  Patient presents with   Covid Positive   HPI  Tested positive for COVID on Monday 07/17/21.  Associated symptoms: -cough, fatigue, sore throat started on Saturday and Sunday before. -symptoms same now has a headache and body aches that began the Monday or Tuesday after testing. -taking motrin or tylenol OTC medication with no relief -coughing up mucus this past weekend; pale yellow in color and started this past weekend with stuffy nose -no fever -no more in home kits have not tested anymore. Patient Active Problem List   Diagnosis Date Noted   Post-COVID chronic cough 07/25/2021   Post-viral cough syndrome 07/25/2021   PSVT (paroxysmal supraventricular tachycardia) ([place]) 06/16/2021   Prediabetes 04/01/2019   Depression 04/01/2019   Hypertension 02/19/2017   Hypertriglyceridemia 06/25/2016   Osteopenia 06/25/2016   Buzzing in ear 06/25/2016   Avitaminosis D 06/25/2016   Cannot sleep 03/28/2007   Past Medical History:  Diagnosis Date    Anxiety    not an issue anymore. this was due to broken ankle   Arthritis    BACK   Complication of anesthesia    SLOW TO WAKE UP X 1 WITH ANKLE SURGERY   GERD (gastroesophageal reflux disease)    OCC-NO MEDS   History of kidney stones    Hypertension 02/19/2017   Hypertriglyceridemia 06/25/2016   Osteopenia 06/25/2016   PONV (postoperative nausea and vomiting)    Allergies  Allergen Reactions   Oxycodone-Acetaminophen Other (See Comments)    Made her feel like "she was losing her mind". Unsure if the reaction was due to percocet or stress at the time.      Medications: Outpatient Medications Prior to Visit  Medication Sig   calcium-vitamin D (OSCAL WITH D) 500-200 MG-UNIT tablet Take 1 tablet by mouth See admin instructions. Take 2 tablets by mouth daily in the morning and 1 tablet in the evening   hydrochlorothiazide (HYDRODIURIL) 12.5 MG tablet Take 1 tablet (12.5 mg total) by mouth daily.   losartan (COZAAR) 50 MG tablet TAKE 1 TABLET DAILY WITH HYDROCHLOROTHIAZIDE 12.'5MG'$  TAB   Multiple Vitamin (MULTIVITAMIN) capsule Take 1 capsule by mouth daily.    niacin 500 MG tablet Take 500 mg by mouth 2 (two) times daily with a meal.    sertraline (ZOLOFT) 50 MG tablet Take 1 tablet (50 mg total) by mouth every morning.   traZODone (DESYREL) 100 MG tablet Take 1 tablet (100 mg total) by mouth at bedtime as needed for sleep.   No facility-administered medications prior to visit.  Last CBC Lab Results  Component Value Date   WBC 5.9 05/12/2021   HGB 13.4 05/12/2021   HCT 41.3 05/12/2021   MCV 89 05/12/2021   MCH 28.9 05/12/2021   RDW 14.0 05/12/2021   PLT 287 0000000   Last metabolic panel Lab Results  Component Value Date   GLUCOSE 102 (H) 05/12/2021   NA 141 05/12/2021   K 4.2 05/12/2021   CL 103 05/12/2021   CO2 24 05/12/2021   BUN 14 05/12/2021   CREATININE 0.97 05/12/2021   GFRNONAA 57 (L) 11/29/2020   GFRAA 66 11/29/2020   CALCIUM 9.8 05/12/2021   PROT 7.1  05/12/2021   ALBUMIN 4.2 05/12/2021   LABGLOB 2.9 05/12/2021   AGRATIO 1.4 05/12/2021   BILITOT 0.4 05/12/2021   ALKPHOS 87 05/12/2021   AST 27 05/12/2021   ALT 20 05/12/2021   ANIONGAP 6 08/28/2018   Last lipids Lab Results  Component Value Date   CHOL 215 (H) 11/29/2020   HDL 51 11/29/2020   LDLCALC 138 (H) 11/29/2020   TRIG 146 11/29/2020   CHOLHDL 4.2 11/29/2020   Last hemoglobin A1c Lab Results  Component Value Date   HGBA1C 5.7 (A) 07/13/2021   Last thyroid functions Lab Results  Component Value Date   TSH 1.890 05/12/2021    Review of Systems  Constitutional:  Positive for fatigue. Negative for diaphoresis and fever.  HENT:  Positive for congestion, postnasal drip, rhinorrhea and sore throat. Negative for ear pain, facial swelling, hearing loss, sinus pain and voice change.   Eyes: Negative.   Respiratory: Negative.    Cardiovascular: Negative.   Neurological: Negative.   Psychiatric/Behavioral: Negative.     Last CBC Lab Results  Component Value Date   WBC 5.9 05/12/2021   HGB 13.4 05/12/2021   HCT 41.3 05/12/2021   MCV 89 05/12/2021   MCH 28.9 05/12/2021   RDW 14.0 05/12/2021   PLT 287 0000000   Last metabolic panel Lab Results  Component Value Date   GLUCOSE 102 (H) 05/12/2021   NA 141 05/12/2021   K 4.2 05/12/2021   CL 103 05/12/2021   CO2 24 05/12/2021   BUN 14 05/12/2021   CREATININE 0.97 05/12/2021   GFRNONAA 57 (L) 11/29/2020   GFRAA 66 11/29/2020   CALCIUM 9.8 05/12/2021   PROT 7.1 05/12/2021   ALBUMIN 4.2 05/12/2021   LABGLOB 2.9 05/12/2021   AGRATIO 1.4 05/12/2021   BILITOT 0.4 05/12/2021   ALKPHOS 87 05/12/2021   AST 27 05/12/2021   ALT 20 05/12/2021   ANIONGAP 6 08/28/2018   Last lipids Lab Results  Component Value Date   CHOL 215 (H) 11/29/2020   HDL 51 11/29/2020   LDLCALC 138 (H) 11/29/2020   TRIG 146 11/29/2020   CHOLHDL 4.2 11/29/2020   Last hemoglobin A1c Lab Results  Component Value Date   HGBA1C 5.7  (A) 07/13/2021   Last thyroid functions Lab Results  Component Value Date   TSH 1.890 05/12/2021        Objective:    There were no vitals taken for this visit. BP Readings from Last 3 Encounters:  07/13/21 118/65  05/12/21 130/68  02/13/21 132/81   Wt Readings from Last 3 Encounters:  07/13/21 195 lb 11.2 oz (88.8 kg)  05/12/21 195 lb 11.2 oz (88.8 kg)  02/13/21 191 lb 12.8 oz (87 kg)      Physical Exam Constitutional:      General: She is not in acute distress.  Appearance: She is not toxic-appearing.  Pulmonary:     Effort: Pulmonary effort is normal.  Neurological:     Mental Status: She is alert and oriented to person, place, and time.  Psychiatric:        Mood and Affect: Mood normal.         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  Advised no need to test again given known positive  Education provided on avoidance of opoid cough medication  Discussed importance of fluids to assist with symptom relief         Sore throat due to virus    COVID positive  Productive cough with associated nasal symptoms  Flonase encouraged as well as fluids advised         Other   Fatigue after COVID-19 vaccination    Pt able to participate in ADLs  Still caring for family member  Sleep limited d/t headache, cough, and caregiver status  Generalized fatigue- advised, small frequent meals and resting when able           I discussed the assessment and treatment plan with the patient. The patient was provided an opportunity to  ask questions and all were answered. The patient agreed with the plan and demonstrated an understanding of the instructions.   The patient was advised to call back or seek an in-person evaluation if the symptoms worsen or if the condition fails to improve as anticipated.  I provided 12 minutes of non-face-to-face time during this encounter.  Vonna Kotyk, [name], have reviewed all documentation for this visit. The documentation on 07/25/21 for the exam, diagnosis, procedures, and orders are all accurate and complete.  Gwyneth Sprout, [place] [phone] (phone) [phone] (fax)  [place]

## 2021-07-25 NOTE — Assessment & Plan Note (Signed)
COVID positive  Productive cough with associated nasal symptoms  Flonase encouraged as well as fluids advised

## 2021-08-10 DIAGNOSIS — L718 Other rosacea: Secondary | ICD-10-CM | POA: Diagnosis not present

## 2021-08-10 DIAGNOSIS — D2262 Melanocytic nevi of left upper limb, including shoulder: Secondary | ICD-10-CM | POA: Diagnosis not present

## 2021-08-10 DIAGNOSIS — L821 Other seborrheic keratosis: Secondary | ICD-10-CM | POA: Diagnosis not present

## 2021-08-10 DIAGNOSIS — D2272 Melanocytic nevi of left lower limb, including hip: Secondary | ICD-10-CM | POA: Diagnosis not present

## 2021-08-10 DIAGNOSIS — X32XXXA Exposure to sunlight, initial encounter: Secondary | ICD-10-CM | POA: Diagnosis not present

## 2021-08-10 DIAGNOSIS — D2261 Melanocytic nevi of right upper limb, including shoulder: Secondary | ICD-10-CM | POA: Diagnosis not present

## 2021-08-10 DIAGNOSIS — D2271 Melanocytic nevi of right lower limb, including hip: Secondary | ICD-10-CM | POA: Diagnosis not present

## 2021-08-10 DIAGNOSIS — L570 Actinic keratosis: Secondary | ICD-10-CM | POA: Diagnosis not present

## 2021-08-10 DIAGNOSIS — D225 Melanocytic nevi of trunk: Secondary | ICD-10-CM | POA: Diagnosis not present

## 2021-09-28 DIAGNOSIS — F5105 Insomnia due to other mental disorder: Secondary | ICD-10-CM | POA: Diagnosis not present

## 2021-09-28 DIAGNOSIS — F4323 Adjustment disorder with mixed anxiety and depressed mood: Secondary | ICD-10-CM | POA: Diagnosis not present

## 2021-11-23 ENCOUNTER — Telehealth: Payer: Medicare HMO | Admitting: Emergency Medicine

## 2021-11-23 DIAGNOSIS — L03211 Cellulitis of face: Secondary | ICD-10-CM | POA: Diagnosis not present

## 2021-11-23 MED ORDER — CEPHALEXIN 500 MG PO CAPS: 500.0000 mg | ORAL_CAPSULE | Freq: Four times a day (QID) | ORAL | 0 refills | Status: AC

## 2021-11-23 NOTE — Progress Notes (Signed)

## 2021-11-28 ENCOUNTER — Other Ambulatory Visit: Payer: Self-pay

## 2021-11-28 ENCOUNTER — Ambulatory Visit
Admission: EM | Admit: 2021-11-28 | Discharge: 2021-11-28 | Disposition: A | Discharge status: Home / Self Care | Payer: Medicare HMO | Attending: Physician Assistant | Admitting: Physician Assistant

## 2021-11-28 DIAGNOSIS — L0201 Cutaneous abscess of face: Secondary | ICD-10-CM | POA: Diagnosis not present

## 2021-11-28 MED ORDER — DOXYCYCLINE HYCLATE 100 MG PO CAPS: 100.0000 mg | ORAL_CAPSULE | Freq: Two times a day (BID) | ORAL | 0 refills | Status: AC

## 2021-11-28 NOTE — ED Provider Notes (Signed)
[facility]    CSN: [id number] Arrival date & time: 11/28/21  1308      History   Chief Complaint Chief Complaint  Patient presents with   Abscess    HPI [name] [name] is a 65 y.o. female presenting for approximately 1 week history of cellulitis and abscess of the chin.  Patient states her PCP called in Keflex for her and the redness is reduced but over the past few days she has had drainage from the area.  Drainage is yellowish-green.  The area is still very tender.  She has not had any associated fevers, nausea/vomiting or fatigue.  No history of recurrent skin infections or MRSA.  Patient has been keeping the area clean and apply Neosporin.  She has 1 day left of the Keflex.  HPI  Past Medical History:  Diagnosis Date   Anxiety    not an issue anymore. this was due to broken ankle   Arthritis    BACK   Complication of anesthesia    SLOW TO WAKE UP X 1 WITH ANKLE SURGERY   GERD (gastroesophageal reflux disease)    OCC-NO MEDS   History of kidney stones    Hypertension 02/19/2017   Hypertriglyceridemia 06/25/2016   Osteopenia 06/25/2016   PONV (postoperative nausea and vomiting)     Patient Active Problem List   Diagnosis Date Noted   Post-COVID chronic cough 07/25/2021   Post-viral cough syndrome 07/25/2021   Sore throat due to virus 07/25/2021   Fatigue after COVID-19 vaccination 07/25/2021   PSVT (paroxysmal supraventricular tachycardia) ([place]) 06/16/2021   Prediabetes 04/01/2019   Depression 04/01/2019   Hypertension 02/19/2017   Hypertriglyceridemia 06/25/2016   Osteopenia 06/25/2016   Buzzing in ear 06/25/2016   Avitaminosis D 06/25/2016   Cannot sleep 03/28/2007    Past Surgical History:  Procedure Laterality Date   ANKLE FRACTURE SURGERY Left 2012   CHOLECYSTECTOMY     COLONOSCOPY WITH PROPOFOL N/A 05/05/2020   Procedure: COLONOSCOPY WITH PROPOFOL;  Surgeon: Toledo, Benay Pike, MD;  Location: [facility];  Service: Gastroenterology;   Laterality: N/A;   CYSTOSCOPY/URETEROSCOPY/HOLMIUM LASER/STENT PLACEMENT Left 09/05/2018   Procedure: CYSTOSCOPY/URETEROSCOPY/HOLMIUM LASER/STENT PLACEMENT;  Surgeon: Billey Co, MD;  Location: [facility];  Service: Urology;  Laterality: Left;   EXTRACORPOREAL SHOCK WAVE LITHOTRIPSY Left 08/21/2018   Procedure: EXTRACORPOREAL SHOCK WAVE LITHOTRIPSY (ESWL);  Surgeon: Abbie Sons, MD;  Location: [facility] OR[facility];  Service: Urology;  Laterality: Left;   FOOT SURGERY Right    neuroma removed   LITHOTRIPSY  1990'S   TUBAL LIGATION     VEIN LIGATION AND STRIPPING Left     OB History   No obstetric history on file.      Home Medications    Prior to Admission medications   Medication Sig Start Date End Date Taking? Authorizing Provider  benzonatate (TESSALON) 100 MG capsule Take 2 capsules (200 mg total) by mouth 3 (three) times daily as needed for cough. 07/25/21  Yes Gwyneth Sprout, [name]  calcium-vitamin D (OSCAL WITH D) 500-200 MG-UNIT tablet Take 1 tablet by mouth See admin instructions. Take 2 tablets by mouth daily in the morning and 1 tablet in the evening   Yes [provider]  cephALEXin (KEFLEX) 500 MG capsule Take 1 capsule (500 mg total) by mouth 4 (four) times daily for 5 days. 11/23/21 11/28/21 Yes Carvel Getting, NP  Dextromethorphan HBr 10 MG/15ML SYRP Take 30 mLs (20 mg total) by mouth every 4 (four) hours  as needed. 07/25/21  Yes Gwyneth Sprout, [name]  doxycycline (VIBRAMYCIN) 100 MG capsule Take 1 capsule (100 mg total) by mouth 2 (two) times daily for 5 days. 11/28/21 12/03/21 Yes Danton Clap, PA-C  hydrochlorothiazide (HYDRODIURIL) 12.5 MG tablet Take 1 tablet (12.5 mg total) by mouth daily. 07/13/21  Yes Bacigalupo, Dionne Bucy, MD  losartan (COZAAR) 50 MG tablet TAKE 1 TABLET DAILY WITH HYDROCHLOROTHIAZIDE 12.5MG  TAB 06/22/21  Yes Bacigalupo, Dionne Bucy, MD  Multiple Vitamin (MULTIVITAMIN) capsule Take 1 capsule by mouth daily.    Yes [provider]  niacin 500 MG  tablet Take 500 mg by mouth 2 (two) times daily with a meal.    Yes [provider]  sertraline (ZOLOFT) 50 MG tablet Take 1 tablet (50 mg total) by mouth every morning. 08/17/20  Yes Bacigalupo, Dionne Bucy, MD  traZODone (DESYREL) 100 MG tablet Take 1 tablet (100 mg total) by mouth at bedtime as needed for sleep. 08/17/20  Yes Bacigalupo, Dionne Bucy, MD  fluticasone (FLONASE) 50 MCG/ACT nasal spray Place 2 sprays into both nostrils daily. 07/25/21 08/24/21  Gwyneth Sprout, [name]    Family History Family History  Problem Relation Age of Onset   Breast cancer Maternal Aunt 72   Kidney disease Mother    Alzheimer's disease Mother    Cancer Father     Social History Social History   Tobacco Use   Smoking status: Never   Smokeless tobacco: Never  Vaping Use   Vaping Use: Never used  Substance Use Topics   Alcohol use: No   Drug use: No     Allergies   Oxycodone-acetaminophen   Review of Systems Review of Systems  Constitutional:  Negative for fatigue and fever.  HENT:  Negative for facial swelling.   Gastrointestinal:  Negative for nausea and vomiting.  Skin:  Positive for color change and wound (open area in center of abscess with drainage).  Neurological:  Negative for weakness.    Physical Exam Triage Vital Signs ED Triage Vitals  Enc Vitals Group     BP 11/28/21 1412 131/71     Pulse Rate 11/28/21 1412 72     Resp 11/28/21 1412 16     Temp 11/28/21 1412 97.9 F (36.6 C)     Temp Source 11/28/21 1412 Oral     SpO2 11/28/21 1412 100 %     Weight 11/28/21 1411 195 lb 12.3 oz (88.8 kg)     Height 11/28/21 1411 5\' 4"  (1.626 m)     Head Circumference --      Peak Flow --      Pain Score 11/28/21 1411 5     Pain Loc --      Pain Edu? --      Excl. in [place]? --    No data found.  Updated Vital Signs BP 131/71 (BP Location: Right Arm)   Pulse 72   Temp 97.9 F (36.6 C) (Oral)   Resp 16   Ht 5\' 4"  (1.626 m)   Wt 195 lb 12.3 oz (88.8 kg)   SpO2 100%   BMI 33.60  kg/m      Physical Exam Vitals and nursing note reviewed.  Constitutional:      General: She is not in acute distress.    Appearance: Normal appearance. She is not ill-appearing or toxic-appearing.  HENT:     Head: Normocephalic and atraumatic.     Nose: Nose normal.  Eyes:     General:  No scleral icterus.       Right eye: No discharge.        Left eye: No discharge.     Conjunctiva/sclera: Conjunctivae normal.  Cardiovascular:     Rate and Rhythm: Normal rate and regular rhythm.  Pulmonary:     Effort: Pulmonary effort is normal. No respiratory distress.  Musculoskeletal:     Cervical back: Neck supple.  Skin:    General: Skin is dry.     Comments: 1.5 cm in diameter area of erythema and fluctuance with central opening and thick yellow/green drainage. Area is TTP.  Neurological:     General: No focal deficit present.     Mental Status: She is alert. Mental status is at baseline.     Motor: No weakness.     Gait: Gait normal.  Psychiatric:        Mood and Affect: Mood normal.        Behavior: Behavior normal.        Thought Content: Thought content normal.     UC Treatments / Results  Labs (all labs ordered are listed, but only abnormal results are displayed) Labs Reviewed - No data to display  EKG   Radiology No results found.  Procedures Incision and Drainage  Date/Time: 11/28/2021 3:45 PM Performed by: Danton Clap, PA-C Authorized by: Danton Clap, PA-C   Consent:    Consent obtained:  Verbal   Consent given by:  Patient   Risks discussed:  Bleeding, infection, incomplete drainage and pain   Alternatives discussed:  Alternative treatment, delayed treatment and observation Universal protocol:    Procedure explained and questions answered to patient or proxy's satisfaction: yes   Location:    Type:  Abscess   Size:  1.5 cm   Location:  Head   Head location:  Face Pre-procedure details:    Skin preparation:  Povidone-iodine Anesthesia:     Anesthesia method:  Local infiltration   Local anesthetic:  Lidocaine 1% w/o epi Procedure type:    Complexity:  Simple Procedure details:    Incision types:  Stab incision   Drainage:  Purulent   Drainage amount:  Moderate   Wound treatment:  Wound left open   Packing materials:  None Post-procedure details:    Procedure completion:  Tolerated well, no immediate complications (including critical care time)  Medications Ordered in UC Medications - No data to display  Initial Impression / Assessment and Plan / UC Course  I have reviewed the triage vital signs and the nursing notes.  Pertinent labs & imaging results that were available during my care of the patient were reviewed by me and considered in my medical decision making (see chart for details).  65 year old female presenting with facial abscess for the past 1 week.  Erythema has improved since being on Keflex for the past 5 to 6 days.  However, she reports a little bit of increased swelling in the area and drainage from the abscess over the past several days.  Area still tender.  No associated fever or red flag signs/symptoms.  Vitals all stable.  She does have 1.5 cm in diameter area of erythema and induration.  There is fluctuance.  Thick yellowish-green drainage from central open area.  Area is very tender to palpation.  Patient gives consent for I&D.  See procedure note above.  Patient tolerated well.  Treating with doxycycline for the next 5 days to ensure the remainder the infection is treated.  Supportive care  with warm compresses and reviewed wound care.  Reviewed return and ER precautions.   Final Clinical Impressions(s) / UC Diagnoses   Final diagnoses:  Facial abscess     Discharge Instructions      -Was able to drain some more pus from the abscess. - Keep the area clean with soap and water.  May apply bandage if it is draining.  Change bandage every day. - I sent in doxycycline antibiotic for few more days. -  Warm compresses to the area and you may have sore drainage. - Tylenol for discomfort. - You should be seen again if fever or any increased redness, swelling or pain.     ED Prescriptions     Medication Sig Dispense Auth. Provider   doxycycline (VIBRAMYCIN) 100 MG capsule Take 1 capsule (100 mg total) by mouth 2 (two) times daily for 5 days. 10 capsule Danton Clap, PA-C      PDMP not reviewed this encounter.   Danton Clap, PA-C 11/28/21 [phone]

## 2021-11-28 NOTE — ED Triage Notes (Signed)
Pt c/o abscess on chin, red and painful. Draining on and off through the day. Sxs started 1 week ago. Pcp called in Keflex without seeing her, with no relief.

## 2021-11-28 NOTE — Discharge Instructions (Signed)
-  Was able to drain some more pus from the abscess. - Keep the area clean with soap and water.  May apply bandage if it is draining.  Change bandage every day. - I sent in doxycycline antibiotic for few more days. - Warm compresses to the area and you may have sore drainage. - Tylenol for discomfort. - You should be seen again if fever or any increased redness, swelling or pain.

## 2021-12-04 ENCOUNTER — Other Ambulatory Visit: Payer: Self-pay | Admitting: Family Medicine

## 2021-12-04 DIAGNOSIS — I10 Essential (primary) hypertension: Secondary | ICD-10-CM

## 2021-12-04 NOTE — Telephone Encounter (Signed)
Requested Prescriptions  Pending Prescriptions Disp Refills  . hydrochlorothiazide (HYDRODIURIL) 12.5 MG tablet [Pharmacy Med Name: HYDROCHLOROTHIAZIDE 12.5 MG Tablet] 90 tablet 0    Sig: TAKE ONE TABLET EVERY DAY WITH LOSARTAN 50MG      Cardiovascular: Diuretics - Thiazide Passed - 12/04/2021  1:10 PM      Passed - Ca in normal range and within 360 days    Calcium  Date Value Ref Range Status  05/12/2021 9.8 8.7 - 10.3 mg/dL Final   Calcium, Total  Date Value Ref Range Status  01/11/2015 8.7 8.5 - 10.1 mg/dL Final         Passed - Cr in normal range and within 360 days    Creatinine  Date Value Ref Range Status  01/11/2015 0.95 0.60 - 1.30 mg/dL Final   Creatinine, Ser  Date Value Ref Range Status  05/12/2021 0.97 0.57 - 1.00 mg/dL Final         Passed - K in normal range and within 360 days    Potassium  Date Value Ref Range Status  05/12/2021 4.2 3.5 - 5.2 mmol/L Final  01/11/2015 3.8 3.5 - 5.1 mmol/L Final         Passed - Na in normal range and within 360 days    Sodium  Date Value Ref Range Status  05/12/2021 141 134 - 144 mmol/L Final  01/11/2015 139 136 - 145 mmol/L Final         Passed - Last BP in normal range    BP Readings from Last 1 Encounters:  11/28/21 131/71         Passed - Valid encounter within last 6 months    Recent Outpatient Visits          4 months ago Post-viral cough syndrome   [facility] Tally Joe T, [name]   4 months ago Prediabetes   [employer], Dionne Bucy, MD   6 months ago [place], Dionne Bucy, MD   1 year ago Annual physical exam   [facility] Carles Collet M, [place]   1 year ago [employer] preventive visit   [employer], Wendee Beavers, [place]      Future Appointments            In 1 month Bacigalupo, Dionne Bucy, MD [facility]           . losartan (COZAAR) 50 MG tablet  [Pharmacy Med Name: LOSARTAN POTASSIUM 50 MG Tablet] 90 tablet 0    Sig: TAKE 1 TABLET DAILY WITH HYDROCHLOROTHIAZIDE 12.5MG  TAB     Cardiovascular:  Angiotensin Receptor Blockers Failed - 12/04/2021  1:10 PM      Failed - Cr in normal range and within 180 days    Creatinine  Date Value Ref Range Status  01/11/2015 0.95 0.60 - 1.30 mg/dL Final   Creatinine, Ser  Date Value Ref Range Status  05/12/2021 0.97 0.57 - 1.00 mg/dL Final         Failed - K in normal range and within 180 days    Potassium  Date Value Ref Range Status  05/12/2021 4.2 3.5 - 5.2 mmol/L Final  01/11/2015 3.8 3.5 - 5.1 mmol/L Final         Passed - Patient is not pregnant      Passed - Last BP in normal range    BP Readings from Last 1 Encounters:  11/28/21 131/71  Passed - Valid encounter within last 6 months    Recent Outpatient Visits          4 months ago Post-viral cough syndrome   [facility] Gwyneth Sprout, [name]   4 months ago Prediabetes   [facility] [place], Dionne Bucy, MD   6 months ago [place], Dionne Bucy, MD   1 year ago Annual physical exam   [facility] Trinna Post, [place]   1 year ago [employer] preventive visit   [place], Wendee Beavers, PA-C      Future Appointments            In 1 month Bacigalupo, Dionne Bucy, MD [facility], [place]

## 2022-01-05 ENCOUNTER — Ambulatory Visit
Admission: EM | Admit: 2022-01-05 | Discharge: 2022-01-05 | Disposition: A | Discharge status: Home / Self Care | Payer: No Typology Code available for payment source | Attending: Emergency Medicine | Admitting: Emergency Medicine

## 2022-01-05 ENCOUNTER — Ambulatory Visit: Payer: Self-pay

## 2022-01-05 ENCOUNTER — Encounter: Payer: Self-pay | Admitting: Emergency Medicine

## 2022-01-05 DIAGNOSIS — L0201 Cutaneous abscess of face: Secondary | ICD-10-CM

## 2022-01-05 DIAGNOSIS — L03211 Cellulitis of face: Secondary | ICD-10-CM

## 2022-01-05 MED ORDER — SULFAMETHOXAZOLE-TRIMETHOPRIM 800-160 MG PO TABS: 1.0000 | ORAL_TABLET | Freq: Two times a day (BID) | ORAL | 0 refills | Status: AC

## 2022-01-05 NOTE — ED Triage Notes (Signed)
Pt presents with an abscess on her scalp x 1 week.

## 2022-01-05 NOTE — Discharge Instructions (Addendum)
Take the antibiotic as directed.  Keep your wound clean and dry.  Wash it gently twice a day with soap and water.  Apply an antibiotic cream twice a day.    Follow up with your primary care provider or a dermatologist.

## 2022-01-05 NOTE — ED Provider Notes (Signed)
[facility]    CSN: [id number] Arrival date & time: 01/05/22  1133      History   Chief Complaint Chief Complaint  Patient presents with   Abscess    HPI [name] [name] is a 65 y.o. female.  Patient presents with painful, red abscess on her left upper forehead at her hairline x1 week.  She has scratched it open but no drainage.  No fever, chills, or other symptoms.  Treatment at home with antibiotic ointment.  She recently had a similar type abscess on her chin: Patient had e-visit on 11/23/2021; diagnosed with facial cellulitis; treated with cephalexin.  She was seen at [facility] urgent care on 11/28/2021 for the same facial abscess on her chin; treated with doxycycline.  The history is provided by the patient and medical records.   Past Medical History:  Diagnosis Date   Anxiety    not an issue anymore. this was due to broken ankle   Arthritis    BACK   Complication of anesthesia    SLOW TO WAKE UP X 1 WITH ANKLE SURGERY   GERD (gastroesophageal reflux disease)    OCC-NO MEDS   History of kidney stones    Hypertension 02/19/2017   Hypertriglyceridemia 06/25/2016   Osteopenia 06/25/2016   PONV (postoperative nausea and vomiting)     Patient Active Problem List   Diagnosis Date Noted   Post-COVID chronic cough 07/25/2021   Post-viral cough syndrome 07/25/2021   Sore throat due to virus 07/25/2021   Fatigue after COVID-19 vaccination 07/25/2021   PSVT (paroxysmal supraventricular tachycardia) ([place]) 06/16/2021   Prediabetes 04/01/2019   Depression 04/01/2019   Hypertension 02/19/2017   Hypertriglyceridemia 06/25/2016   Osteopenia 06/25/2016   Buzzing in ear 06/25/2016   Avitaminosis D 06/25/2016   Cannot sleep 03/28/2007    Past Surgical History:  Procedure Laterality Date   ANKLE FRACTURE SURGERY Left 2012   CHOLECYSTECTOMY     COLONOSCOPY WITH PROPOFOL N/A 05/05/2020   Procedure: COLONOSCOPY WITH PROPOFOL;  Surgeon: Toledo, Benay Pike, MD;  Location:  [facility];  Service: Gastroenterology;  Laterality: N/A;   CYSTOSCOPY/URETEROSCOPY/HOLMIUM LASER/STENT PLACEMENT Left 09/05/2018   Procedure: CYSTOSCOPY/URETEROSCOPY/HOLMIUM LASER/STENT PLACEMENT;  Surgeon: Billey Co, MD;  Location: [facility] ORS;  Service: Urology;  Laterality: Left;   EXTRACORPOREAL SHOCK WAVE LITHOTRIPSY Left 08/21/2018   Procedure: EXTRACORPOREAL SHOCK WAVE LITHOTRIPSY (ESWL);  Surgeon: Abbie Sons, MD;  Location: [facility] ORS;  Service: Urology;  Laterality: Left;   FOOT SURGERY Right    neuroma removed   LITHOTRIPSY  1990'S   TUBAL LIGATION     VEIN LIGATION AND STRIPPING Left     OB History   No obstetric history on file.      Home Medications    Prior to Admission medications   Medication Sig Start Date End Date Taking? Authorizing Provider  hydrochlorothiazide (HYDRODIURIL) 12.5 MG tablet TAKE ONE TABLET EVERY DAY WITH LOSARTAN 50MG  12/04/21  Yes Bacigalupo, Dionne Bucy, MD  losartan (COZAAR) 50 MG tablet TAKE 1 TABLET DAILY WITH HYDROCHLOROTHIAZIDE 12.5MG  TAB 12/04/21  Yes Bacigalupo, Dionne Bucy, MD  Multiple Vitamin (MULTIVITAMIN) capsule Take 1 capsule by mouth daily.    Yes [provider]  sertraline (ZOLOFT) 50 MG tablet Take 1 tablet (50 mg total) by mouth every morning. 08/17/20  Yes Bacigalupo, Dionne Bucy, MD  sulfamethoxazole-trimethoprim (BACTRIM DS) 800-160 MG tablet Take 1 tablet by mouth 2 (two) times daily for 7 days. 01/05/22 01/12/22 Yes Sharion Balloon, NP  traZODone (  DESYREL) 100 MG tablet Take 1 tablet (100 mg total) by mouth at bedtime as needed for sleep. 08/17/20  Yes Bacigalupo, Dionne Bucy, MD  benzonatate (TESSALON) 100 MG capsule Take 2 capsules (200 mg total) by mouth 3 (three) times daily as needed for cough. 07/25/21   Gwyneth Sprout, [name]  calcium-vitamin D (OSCAL WITH D) 500-200 MG-UNIT tablet Take 1 tablet by mouth See admin instructions. Take 2 tablets by mouth daily in the morning and 1 tablet in the evening    [provider]  Dextromethorphan HBr 10 MG/15ML SYRP Take 30 mLs (20 mg total) by mouth every 4 (four) hours as needed. 07/25/21   Gwyneth Sprout, [name]  fluticasone (FLONASE) 50 MCG/ACT nasal spray Place 2 sprays into both nostrils daily. 07/25/21 08/24/21  Gwyneth Sprout, [name]  niacin 500 MG tablet Take 500 mg by mouth 2 (two) times daily with a meal.     [provider]    Family History Family History  Problem Relation Age of Onset   Breast cancer Maternal Aunt 73   Kidney disease Mother    Alzheimer's disease Mother    Cancer Father     Social History Social History   Tobacco Use   Smoking status: Never   Smokeless tobacco: Never  Vaping Use   Vaping Use: Never used  Substance Use Topics   Alcohol use: No   Drug use: No     Allergies   Oxycodone-acetaminophen   Review of Systems Review of Systems  Constitutional:  Negative for chills and fever.  Skin:  Positive for color change and wound.  All other systems reviewed and are negative.   Physical Exam Triage Vital Signs ED Triage Vitals  Enc Vitals Group     BP 01/05/22 1149 116/76     Pulse Rate 01/05/22 1149 78     Resp 01/05/22 1149 18     Temp 01/05/22 1149 98.4 F (36.9 C)     Temp src --      SpO2 01/05/22 1149 96 %     Weight --      Height --      Head Circumference --      Peak Flow --      Pain Score 01/05/22 1150 6     Pain Loc --      Pain Edu? --      Excl. in [place]? --    No data found.  Updated Vital Signs BP 116/76 (BP Location: Left Arm)    Pulse 78    Temp 98.4 F (36.9 C)    Resp 18    SpO2 96%   Visual Acuity Right Eye Distance:   Left Eye Distance:   Bilateral Distance:    Right Eye Near:   Left Eye Near:    Bilateral Near:     Physical Exam Vitals and nursing note reviewed.  Constitutional:      General: She is not in acute distress.    Appearance: Normal appearance. She is well-developed. She is not ill-appearing.  HENT:     Head:      Mouth/Throat:     Mouth:  Mucous membranes are moist.  Cardiovascular:     Rate and Rhythm: Normal rate and regular rhythm.     Heart sounds: Normal heart sounds.  Pulmonary:     Effort: Pulmonary effort is normal. No respiratory distress.     Breath sounds: Normal breath sounds.  Musculoskeletal:  General: No swelling.     Cervical back: Neck supple.  Skin:    General: Skin is warm and dry.     Findings: Erythema and lesion present.     Comments: 3 cm area of erythema with central induration and scabbed lesion. No drainage. Needle aspiration with blood return only.   Neurological:     Mental Status: She is alert.  Psychiatric:        Mood and Affect: Mood normal.        Behavior: Behavior normal.     UC Treatments / Results  Labs (all labs ordered are listed, but only abnormal results are displayed) Labs Reviewed - No data to display  EKG   Radiology No results found.  Procedures Procedures (including critical care time)  Medications Ordered in UC Medications - No data to display  Initial Impression / Assessment and Plan / UC Course  I have reviewed the triage vital signs and the nursing notes.  Pertinent labs & imaging results that were available during my care of the patient were reviewed by me and considered in my medical decision making (see chart for details).    Cellulitis and abscess of face.  Needle aspiration has blood return only. Patient has scratched the area open and it is scabbed over.  Treating with Bactrim.  Discussed wound care and signs of worsening infection.  Instructed patient to follow up with her PCP or a dermatologist.  She agrees to plan of care.    Final Clinical Impressions(s) / UC Diagnoses   Final diagnoses:  Cellulitis of face  Abscess of face     Discharge Instructions      Take the antibiotic as directed.  Keep your wound clean and dry.  Wash it gently twice a day with soap and water.  Apply an antibiotic cream twice a day.    Follow up with  your primary care provider or a dermatologist.           ED Prescriptions     Medication Sig Dispense Auth. Provider   sulfamethoxazole-trimethoprim (BACTRIM DS) 800-160 MG tablet Take 1 tablet by mouth 2 (two) times daily for 7 days. 14 tablet Sharion Balloon, NP      PDMP not reviewed this encounter.   Sharion Balloon, NP 01/05/22 1243

## 2022-01-05 NOTE — Telephone Encounter (Signed)
° °  Chief Complaint: Boil to forehead in hairline Symptoms: Pain. Area is the size of a quarter, very painful, no drainage. Frequency: Started this week. Pertinent Negatives: Patient denies fever Disposition: [] ED /[x] Urgent Care (no appt availability in office) / [] Appointment(In office/virtual)/ []  [place] [facility]/ [] Home Care/ [] Refused Recommended Disposition /[] [facility]/ []  Follow-up with PCP Additional Notes: No availability in the practice.  Reason for Disposition  SEVERE pain (e.g., excruciating)  Answer Assessment - Initial Assessment Questions 1. APPEARANCE of BOIL: "What does the boil look like?"      Circular 2. LOCATION: "Where is the boil located?"      Forehead in hairline 3. NUMBER: "How many boils are there?"      1 4. SIZE: "How big is the boil?" (e.g., inches, cm; compare to size of a coin or other object)     Quarter size 5. ONSET: "When did the boil start?"     This week 6. PAIN: "Is there any pain?" If Yes, ask: "How bad is the pain?"   (Scale 1-10; or mild, moderate, severe)     6-7 7. FEVER: "Do you have a fever?" If Yes, ask: "What is it, how was it measured, and when did it start?"      No 8. SOURCE: "Have you been around anyone with boils or other Staph infections?" "Have you ever had boils before?"     No 9. OTHER SYMPTOMS: "Do you have any other symptoms?" (e.g., shaking chills, weakness, rash elsewhere on body)     No 10. PREGNANCY: "Is there any chance you are pregnant?" "When was your last menstrual period?"       No  Protocols used: Boil (Skin Abscess)-A-AH

## 2022-01-17 NOTE — Progress Notes (Signed)
Established patient visit   Patient: [name] [name]   DOB: Nov 02, 1956   66 y.o. Female  MRN: [id number] Visit Date: 01/18/2022  Today's healthcare provider: Lavon Paganini, MD   Chief Complaint  Patient presents with   Chest Pain   Subjective    Chest Pain    Started Sunday (5 days ago) Never had before All day Sunday and Monday, center of chest pressure feeling Starts in the AM, not particularly exertional, but SOB worse with exertion +nausea No radiation Intermittent over last 3 days None currently  Medications: Outpatient Medications Prior to Visit  Medication Sig   calcium-vitamin D (OSCAL WITH D) 500-200 MG-UNIT tablet Take 1 tablet by mouth See admin instructions. Take 2 tablets by mouth daily in the morning and 1 tablet in the evening   Multiple Vitamin (MULTIVITAMIN) capsule Take 1 capsule by mouth daily.    niacin 500 MG tablet Take 500 mg by mouth 2 (two) times daily with a meal.    sertraline (ZOLOFT) 50 MG tablet Take 1 tablet (50 mg total) by mouth every morning.   traZODone (DESYREL) 100 MG tablet Take 1 tablet (100 mg total) by mouth at bedtime as needed for sleep.   [DISCONTINUED] hydrochlorothiazide (HYDRODIURIL) 12.5 MG tablet TAKE ONE TABLET EVERY DAY WITH LOSARTAN 50MG    [DISCONTINUED] losartan (COZAAR) 50 MG tablet TAKE 1 TABLET DAILY WITH HYDROCHLOROTHIAZIDE 12.5MG  TAB   [DISCONTINUED] benzonatate (TESSALON) 100 MG capsule Take 2 capsules (200 mg total) by mouth 3 (three) times daily as needed for cough.   [DISCONTINUED] Dextromethorphan HBr 10 MG/15ML SYRP Take 30 mLs (20 mg total) by mouth every 4 (four) hours as needed.   [DISCONTINUED] fluticasone (FLONASE) 50 MCG/ACT nasal spray Place 2 sprays into both nostrils daily.   No facility-administered medications prior to visit.    Review of Systems  Cardiovascular:  Positive for chest pain.  per HPI     Objective    BP 126/83 (BP Location: Left Arm, Patient Position: Sitting, Cuff  Size: Large)    Pulse 81    Ht 5' 4.5" (1.638 m)    Wt 187 lb (84.8 kg)    SpO2 99%    BMI 31.60 kg/m  {Show previous vital signs (optional):23777}  Physical Exam Vitals reviewed.  Constitutional:      General: She is not in acute distress.    Appearance: Normal appearance. She is well-developed. She is not diaphoretic.  HENT:     Head: Normocephalic and atraumatic.  Eyes:     General: No scleral icterus.    Conjunctiva/sclera: Conjunctivae normal.  Neck:     Thyroid: No thyromegaly.  Cardiovascular:     Rate and Rhythm: Normal rate and regular rhythm.     Pulses: Normal pulses.     Heart sounds: Normal heart sounds. No murmur heard. Pulmonary:     Effort: Pulmonary effort is normal. No respiratory distress.     Breath sounds: Normal breath sounds. No wheezing, rhonchi or rales.  Musculoskeletal:     Cervical back: Neck supple.     Right lower leg: No edema.     Left lower leg: No edema.  Lymphadenopathy:     Cervical: No cervical adenopathy.  Skin:    General: Skin is warm and dry.     Findings: No rash.  Neurological:     Mental Status: She is alert and oriented to person, place, and time. Mental status is at baseline.  Psychiatric:  Mood and Affect: Mood normal.        Behavior: Behavior normal.    EKG: NSR with flipped T waves in precordial leads - stable from baseline  No results found for any visits on 01/18/22.  Assessment & Plan     Problem List Items Addressed This Visit       Cardiovascular and Mediastinum   Hypertension    Well controlled Continue current medications Recheck metabolic panel F/u in 6 months       Relevant Medications   hydrochlorothiazide (HYDRODIURIL) 12.5 MG tablet   losartan (COZAAR) 50 MG tablet   Other Relevant Orders   Comprehensive metabolic panel   PSVT (paroxysmal supraventricular tachycardia) (HCC)    Chronic and stable Continue to monitor      Relevant Medications   hydrochlorothiazide (HYDRODIURIL) 12.5 MG  tablet   losartan (COZAAR) 50 MG tablet     Other   Hypertriglyceridemia   Relevant Medications   hydrochlorothiazide (HYDRODIURIL) 12.5 MG tablet   losartan (COZAAR) 50 MG tablet   Other Relevant Orders   Lipid panel   Comprehensive metabolic panel   Prediabetes    Recommend low carb diet Recheck A1c       Relevant Orders   Hemoglobin A1c   Chest pain - Primary    New problem Concerning history for unstable angina Unchanged EKG from baseline No active chest pain to warrant ER visit now, but did place stat Cardiology referral Strict emergent precautions discussed      Relevant Orders   EKG 12-Lead (Completed)   Ambulatory referral to Cardiology   CBC   Other Visit Diagnoses     Screening mammogram for breast cancer       Relevant Orders   MM 3D SCREEN BREAST BILATERAL        Return in about 6 months (around 07/18/2022) for CPE.  AWV with [facility] first available     Total time spent on today's visit was greater than 40 minutes, including both face-to-face time and nonface-to-face time personally spent on review of chart (labs and imaging), discussing labs and goals, discussing further work-up, treatment options, referrals to specialist, answering patient's questions, and coordinating care.    I, Lavon Paganini, MD, have reviewed all documentation for this visit. The documentation on 01/18/22 for the exam, diagnosis, procedures, and orders are all accurate and complete.   Smera Guyette, Dionne Bucy, MD, MPH [facility]

## 2022-01-18 ENCOUNTER — Other Ambulatory Visit: Payer: Self-pay

## 2022-01-18 ENCOUNTER — Ambulatory Visit (INDEPENDENT_AMBULATORY_CARE_PROVIDER_SITE_OTHER): Payer: No Typology Code available for payment source | Admitting: Family Medicine

## 2022-01-18 ENCOUNTER — Encounter: Payer: Self-pay | Admitting: Family Medicine

## 2022-01-18 VITALS — BP 126/83 | HR 81 | Ht 64.5 in | Wt 187.0 lb

## 2022-01-18 DIAGNOSIS — I471 Supraventricular tachycardia: Secondary | ICD-10-CM

## 2022-01-18 DIAGNOSIS — I10 Essential (primary) hypertension: Secondary | ICD-10-CM | POA: Diagnosis not present

## 2022-01-18 DIAGNOSIS — Z23 Encounter for immunization: Secondary | ICD-10-CM | POA: Diagnosis not present

## 2022-01-18 DIAGNOSIS — R7303 Prediabetes: Secondary | ICD-10-CM

## 2022-01-18 DIAGNOSIS — R079 Chest pain, unspecified: Secondary | ICD-10-CM | POA: Insufficient documentation

## 2022-01-18 DIAGNOSIS — Z1231 Encounter for screening mammogram for malignant neoplasm of breast: Secondary | ICD-10-CM

## 2022-01-18 DIAGNOSIS — E781 Pure hyperglyceridemia: Secondary | ICD-10-CM

## 2022-01-18 MED ORDER — LOSARTAN POTASSIUM 50 MG PO TABS: 50.0000 mg | ORAL_TABLET | Freq: Every day | ORAL | 3 refills | Status: DC

## 2022-01-18 MED ORDER — HYDROCHLOROTHIAZIDE 12.5 MG PO TABS: ORAL_TABLET | ORAL | 3 refills | Status: DC

## 2022-01-18 NOTE — Assessment & Plan Note (Signed)
Recommend low carb diet °Recheck A1c  °

## 2022-01-18 NOTE — Assessment & Plan Note (Signed)
New problem Concerning history for unstable angina Unchanged EKG from baseline No active chest pain to warrant ER visit now, but did place stat Cardiology referral Strict emergent precautions discussed

## 2022-01-18 NOTE — Assessment & Plan Note (Signed)
Well controlled Continue current medications Recheck metabolic panel F/u in 6 months  

## 2022-01-18 NOTE — Assessment & Plan Note (Signed)
Chronic and stable Continue to monitor 

## 2022-01-18 NOTE — Addendum Note (Signed)
Addended by: Ashley Royalty E on: 01/18/2022 04:52 PM   Modules accepted: Orders

## 2022-01-19 ENCOUNTER — Encounter: Payer: Self-pay | Admitting: Cardiovascular Disease

## 2022-01-19 ENCOUNTER — Ambulatory Visit (INDEPENDENT_AMBULATORY_CARE_PROVIDER_SITE_OTHER): Payer: No Typology Code available for payment source | Admitting: Cardiovascular Disease

## 2022-01-19 VITALS — BP 112/80 | HR 71 | Ht 64.5 in | Wt 187.0 lb

## 2022-01-19 DIAGNOSIS — I479 Paroxysmal tachycardia, unspecified: Secondary | ICD-10-CM

## 2022-01-19 DIAGNOSIS — R079 Chest pain, unspecified: Secondary | ICD-10-CM

## 2022-01-19 DIAGNOSIS — E781 Pure hyperglyceridemia: Secondary | ICD-10-CM | POA: Diagnosis not present

## 2022-01-19 DIAGNOSIS — I10 Essential (primary) hypertension: Secondary | ICD-10-CM | POA: Diagnosis not present

## 2022-01-19 DIAGNOSIS — E782 Mixed hyperlipidemia: Secondary | ICD-10-CM | POA: Diagnosis not present

## 2022-01-19 DIAGNOSIS — I471 Supraventricular tachycardia: Secondary | ICD-10-CM | POA: Diagnosis not present

## 2022-01-19 DIAGNOSIS — R7303 Prediabetes: Secondary | ICD-10-CM | POA: Diagnosis not present

## 2022-01-19 MED ORDER — DILTIAZEM HCL 30 MG PO TABS: 30.0000 mg | ORAL_TABLET | Freq: Three times a day (TID) | ORAL | 3 refills | Status: AC | PRN

## 2022-01-19 NOTE — Progress Notes (Signed)
Cardiology Office Note  Date:  01/20/2022   ID:  [name], [name] 18-Jun-1956, MRN [id number]  PCP:  Virginia Crews, MD   Chief Complaint  Patient presents with   New Patient (Initial Visit)    Ref by Dr. Brita Romp for chest pain. Medications reviewed by the patient verbally. Patient c/o shortness of breath, dull ache/chest heaviness/pressure and heart pounding; symptoms started on Sunday, January 14, 2022. Patient was diagnosed in May with PSVT by a holter monitor.     HPI:  [name] [name] is a 66 year old woman with history of Hypertension Paroxysmal SVT, seen on Zio monitor 04/2021 Elevated triglycerides Prediabetes Who presents by referral from Dr. Brita Romp for consultation of her chest pain, shortness of breath  On discussion today reports having recent episodes of chest discomfort, little bit of shortness of breath Tachycardia Pain started 1 week ago, all day Sunday and Monday, center of chest at pressure Little bit on tuesday am (not much Wednesday Thursday Friday today) Typically starts in the morning, nonexertional Is better shortness of breath worse Also some nausea  EKG done through primary care yesterday reviewed personally by myself  normal sinus rhythm no significant ST-T wave changes  Significant stress, grand daughter, special needs  Strong family of CAD  EKG personally reviewed by myself on todays visit Normal sinus rhythm rate 71 bpm no significant ST-T wave changes  ZIO monitor reviewed from May 2022, short runs atrial tach/SVT   PMH:   has a past medical history of Anxiety, Arthritis, Complication of anesthesia, GERD (gastroesophageal reflux disease), History of kidney stones, Hypertension (02/19/2017), Hypertriglyceridemia (06/25/2016), Osteopenia (06/25/2016), and PONV (postoperative nausea and vomiting).  PSH:    Past Surgical History:  Procedure Laterality Date   ANKLE FRACTURE SURGERY Left 2012   CHOLECYSTECTOMY     COLONOSCOPY  WITH PROPOFOL N/A 05/05/2020   Procedure: COLONOSCOPY WITH PROPOFOL;  Surgeon: Toledo, Benay Pike, MD;  Location: [facility];  Service: Gastroenterology;  Laterality: N/A;   CYSTOSCOPY/URETEROSCOPY/HOLMIUM LASER/STENT PLACEMENT Left 09/05/2018   Procedure: CYSTOSCOPY/URETEROSCOPY/HOLMIUM LASER/STENT PLACEMENT;  Surgeon: Billey Co, MD;  Location: [facility] ORS;  Service: Urology;  Laterality: Left;   EXTRACORPOREAL SHOCK WAVE LITHOTRIPSY Left 08/21/2018   Procedure: EXTRACORPOREAL SHOCK WAVE LITHOTRIPSY (ESWL);  Surgeon: Abbie Sons, MD;  Location: [facility] ORS;  Service: Urology;  Laterality: Left;   FOOT SURGERY Right    neuroma removed   LITHOTRIPSY  1990'S   TUBAL LIGATION     VEIN LIGATION AND STRIPPING Left     Current Outpatient Medications  Medication Sig Dispense Refill   calcium-vitamin D (OSCAL WITH D) 500-200 MG-UNIT tablet Take 1 tablet by mouth See admin instructions. Take 2 tablets by mouth daily in the morning and 1 tablet in the evening     diltiazem (CARDIZEM) 30 MG tablet Take 1 tablet (30 mg total) by mouth 3 (three) times daily as needed. For fast heart rate 180 tablet 3   hydrochlorothiazide (HYDRODIURIL) 12.5 MG tablet TAKE ONE TABLET EVERY DAY WITH LOSARTAN 50MG  90 tablet 3   losartan (COZAAR) 50 MG tablet Take 1 tablet (50 mg total) by mouth daily. 90 tablet 3   Multiple Vitamin (MULTIVITAMIN) capsule Take 1 capsule by mouth daily.      niacin 500 MG tablet Take 500 mg by mouth 2 (two) times daily with a meal.      sertraline (ZOLOFT) 50 MG tablet Take 1 tablet (50 mg total) by mouth every morning. 90 tablet 1   traZODone (DESYREL)  100 MG tablet Take 1 tablet (100 mg total) by mouth at bedtime as needed for sleep. 90 tablet 1   No current facility-administered medications for this visit.    Allergies:   Oxycodone-acetaminophen   Social History:  The patient  reports that she has never smoked. She has never used smokeless tobacco. She reports that she does not  drink alcohol and does not use drugs.   Family History:   family history includes Alzheimer's disease in her mother; Breast cancer (age of onset: 74) in her maternal aunt; Cancer in her father; Kidney disease in her mother.    Review of Systems: Review of Systems  Constitutional: Negative.   HENT: Negative.    Respiratory:  Positive for shortness of breath.   Cardiovascular:  Positive for chest pain.  Gastrointestinal: Negative.   Musculoskeletal: Negative.   Neurological: Negative.   Psychiatric/Behavioral: Negative.    All other systems reviewed and are negative.   PHYSICAL EXAM: VS:  BP 112/80 (BP Location: Right Arm, Patient Position: Sitting, Cuff Size: Normal)    Pulse 71    Ht 5' 4.5" (1.638 m)    Wt 187 lb (84.8 kg)    SpO2 98%    BMI 31.60 kg/m  , BMI Body mass index is 31.6 kg/m. GEN: Well nourished, well developed, in no acute distress HEENT: normal Neck: no JVD, carotid bruits, or masses Cardiac: RRR; no murmurs, rubs, or gallops,no edema  Respiratory:  clear to auscultation bilaterally, normal work of breathing GI: soft, nontender, nondistended, + BS MS: no deformity or atrophy Skin: warm and dry, no rash Neuro:  Strength and sensation are intact Psych: euthymic mood, full affect   Recent Labs: 05/12/2021: TSH 1.890 01/19/2022: ALT 22; BUN 19; Creatinine, Ser 1.03; Hemoglobin 13.5; Platelets 261; Potassium 4.3; Sodium 141    Lipid Panel Lab Results  Component Value Date   CHOL 217 (H) 01/19/2022   HDL 59 01/19/2022   LDLCALC 137 (H) 01/19/2022   TRIG 116 01/19/2022      Wt Readings from Last 3 Encounters:  01/19/22 187 lb (84.8 kg)  01/18/22 187 lb (84.8 kg)  11/28/21 195 lb 12.3 oz (88.8 kg)     ASSESSMENT AND PLAN:  Problem List Items Addressed This Visit     Chest pain - Primary   Relevant Orders   EKG 12-Lead   ECHOCARDIOGRAM COMPLETE   Other Visit Diagnoses     Paroxysmal tachycardia (HCC)       Relevant Medications   diltiazem  (CARDIZEM) 30 MG tablet   Other Relevant Orders   EKG 12-Lead   ECHOCARDIOGRAM COMPLETE      Chest pain/shortness of breath, tachycardia Symptoms have since resolved, etiology unclear Unable to exclude underlying arrhythmia Concerned about underlying ischemic disease given strong family history Given no anginal symptoms at this time, normal EKG, we will start with echocardiogram She is in agreement, will call us for any further symptoms Stress testing could be ordered for any symptoms concerning for angina  Paroxysmal SVT Discussed in detail, Prior ZIO monitor reviewed in detail Rare short episodes For any sustained episodes recommended that she take diltiazem 30 mg  Hyperlipidemia Recommend she consider CT coronary calcium scoring for risk stratification, she will call us if she would like this ordered   Total encounter time more than 60 minutes  Greater than 50% was spent in counseling and coordination of care with the patient   Signed, Esmond Plants, M.D., Ph.D. [place]  [phone] °

## 2022-01-19 NOTE — Patient Instructions (Addendum)
Research [employer] To track blood pressure, heart rate, and do 30 sec rhythm strips  Can be found online or [employer]  Medication Instructions:  Please START Diltiazem 30 mg as needed  May take up to 3 times a day for tachycardia (fast heart rate)  Consider moving the losartan to night, you may need 1/2 pill  HCTZ (water pill) in the AM first thing  If you need a refill on your cardiac medications before your next appointment, please call your pharmacy.   Lab work: No new labs needed  Testing/Procedures: Echocardiogram  Read about CT coronary calcium score $99 out-of-pocket expense   Follow-Up: At [facility], you and your health needs are our priority.  As part of our continuing mission to provide you with exceptional heart care, we have created designated Provider Care Teams.  These Care Teams include your primary Cardiologist (physician) and Advanced Practice Providers (APPs -  Physician Assistants and Nurse Practitioners) who all work together to provide you with the care you need, when you need it.  You will need a follow up appointment as needed  Providers on your designated Care Team:   Murray Hodgkins, NP Christell Faith, PA-C Cadence Kathlen Mody, [place]  COVID-19 Vaccine Information can be found at: ShippingScam.co.uk For questions related to vaccine distribution or appointments, please email vaccine@[email or web address] .com or call [phone].

## 2022-01-20 LAB — COMPREHENSIVE METABOLIC PANEL
ALT: 22 IU/L (ref 0–32)
AST: 26 IU/L (ref 0–40)
Albumin/Globulin Ratio: 1.7 (ref 1.2–2.2)
Albumin: 4.5 g/dL (ref 3.8–4.8)
Alkaline Phosphatase: 76 IU/L (ref 44–121)
BUN/Creatinine Ratio: 18 (ref 12–28)
BUN: 19 mg/dL (ref 8–27)
Bilirubin Total: 0.4 mg/dL (ref 0.0–1.2)
CO2: 28 mmol/L (ref 20–29)
Calcium: 10.4 mg/dL — ABNORMAL HIGH (ref 8.7–10.3)
Chloride: 102 mmol/L (ref 96–106)
Creatinine, Ser: 1.03 mg/dL — ABNORMAL HIGH (ref 0.57–1.00)
Globulin, Total: 2.7 g/dL (ref 1.5–4.5)
Glucose: 105 mg/dL — ABNORMAL HIGH (ref 70–99)
Potassium: 4.3 mmol/L (ref 3.5–5.2)
Sodium: 141 mmol/L (ref 134–144)
Total Protein: 7.2 g/dL (ref 6.0–8.5)
eGFR: 60 mL/min/{1.73_m2} (ref >59)

## 2022-01-20 LAB — CBC
Hematocrit: 40.5 % (ref 34.0–46.6)
Hemoglobin: 13.5 g/dL (ref 11.1–15.9)
MCH: 29 pg (ref 26.6–33.0)
MCHC: 33.3 g/dL (ref 31.5–35.7)
MCV: 87 fL (ref 79–97)
Platelets: 261 10*3/uL (ref 150–450)
RBC: 4.66 x10E6/uL (ref 3.77–5.28)
RDW: 13 % (ref 11.7–15.4)
WBC: 5.8 10*3/uL (ref 3.4–10.8)

## 2022-01-20 LAB — LIPID PANEL
Chol/HDL Ratio: 3.7 ratio (ref 0.0–4.4)
Cholesterol, Total: 217 mg/dL — ABNORMAL HIGH (ref 100–199)
HDL: 59 mg/dL (ref >39)
LDL Chol Calc (NIH): 137 mg/dL — ABNORMAL HIGH (ref 0–99)
Triglycerides: 116 mg/dL (ref 0–149)
VLDL Cholesterol Cal: 21 mg/dL (ref 5–40)

## 2022-01-20 LAB — HEMOGLOBIN A1C
Est. average glucose Bld gHb Est-mCnc: 128 mg/dL
Hgb A1c MFr Bld: 6.1 % — ABNORMAL HIGH (ref 4.8–5.6)

## 2022-01-22 ENCOUNTER — Telehealth: Payer: Self-pay

## 2022-01-22 NOTE — Telephone Encounter (Signed)
Patient aware.

## 2022-01-22 NOTE — Telephone Encounter (Signed)
-----   Message from Virginia Crews, MD sent at 01/22/2022  8:36 AM EST ----- Normal/stable labs. Calcium is slightly elevated. Recommend holding any calcium supplement and rechecking lab in 2-4 weeks.

## 2022-01-25 ENCOUNTER — Ambulatory Visit (INDEPENDENT_AMBULATORY_CARE_PROVIDER_SITE_OTHER): Payer: No Typology Code available for payment source

## 2022-01-25 ENCOUNTER — Other Ambulatory Visit: Payer: Self-pay

## 2022-01-25 DIAGNOSIS — I479 Paroxysmal tachycardia, unspecified: Secondary | ICD-10-CM | POA: Diagnosis not present

## 2022-01-25 DIAGNOSIS — R079 Chest pain, unspecified: Secondary | ICD-10-CM | POA: Diagnosis not present

## 2022-01-25 LAB — ECHOCARDIOGRAM COMPLETE
AR max vel: 1.91 cm2
AV Area VTI: 2.02 cm2
AV Area mean vel: 2.02 cm2
AV Mean grad: 3 mmHg
AV Peak grad: 5.5 mmHg
Ao pk vel: 1.17 m/s
Area-P 1/2: 3.65 cm2
Calc EF: 65.6 %
S' Lateral: 1.8 cm
Single Plane A2C EF: 66 %
Single Plane A4C EF: 61.4 %

## 2022-01-29 ENCOUNTER — Telehealth: Payer: Self-pay

## 2022-01-29 NOTE — Telephone Encounter (Signed)
Able to reach pt regarding her recent ECHO  Dr. Rockey Situ had a chance to review her results and advised   "Echocardiogram Normal right and left ventricular function Normal valves Normal pressures"  Mrs. [name] [name] very thankful for the phone call of her results, all questions and concerns were address with nothing further at this time. Will see at next schedule f/u appt.

## 2022-02-19 ENCOUNTER — Ambulatory Visit (INDEPENDENT_AMBULATORY_CARE_PROVIDER_SITE_OTHER): Payer: No Typology Code available for payment source

## 2022-02-19 DIAGNOSIS — Z0000 Encounter for general adult medical examination without abnormal findings: Secondary | ICD-10-CM | POA: Diagnosis not present

## 2022-02-19 NOTE — Progress Notes (Signed)
Virtual Visit via Telephone Note  I connected with  [name] [name] on 02/19/22 at  3:40 PM EST by telephone and verified that I am speaking with the correct person using two identifiers.  Location: Patient: home Provider: [facility] Persons participating in the virtual visit: [place]   I discussed the limitations, risks, security and privacy concerns of performing an evaluation and management service by telephone and the availability of in person appointments. The patient expressed understanding and agreed to proceed.  Interactive audio and video telecommunications were attempted between this nurse and patient, however failed, due to patient having technical difficulties OR patient did not have access to video capability.  We continued and completed visit with audio only.  Some vital signs may be absent or patient reported.   Dionisio David, LPN  Subjective:   [name] [name] is a 66 y.o. female who presents for Medicare Annual (Subsequent) preventive examination.  Review of Systems           Objective:    There were no vitals filed for this visit. There is no height or weight on file to calculate BMI.  Advanced Directives 02/13/2021 05/05/2020 09/05/2018 09/03/2018 08/28/2018 08/15/2018 01/30/2017  Does Patient Have a Medical Advance Directive? No Yes Yes Yes No No Yes  Type of Advance Directive - Living will;Healthcare Power of Attorney Living will;Healthcare Power of Attorney - - - Living will;Healthcare Power of Attorney  Does patient want to make changes to medical advance directive? - - No - Patient declined - - - -  Copy of [place] in Chart? - - No - copy requested - - - -  Would patient like information on creating a medical advance directive? No - Patient declined - No - Patient declined - - No - Patient declined -    Current Medications (verified) Outpatient Encounter Medications as of 02/19/2022  Medication Sig   calcium-vitamin  D (OSCAL WITH D) 500-200 MG-UNIT tablet Take 1 tablet by mouth See admin instructions. Take 2 tablets by mouth daily in the morning and 1 tablet in the evening   diltiazem (CARDIZEM) 30 MG tablet Take 1 tablet (30 mg total) by mouth 3 (three) times daily as needed. For fast heart rate   FLUZONE HIGH-DOSE QUADRIVALENT 0.7 ML SUSY    hydrochlorothiazide (HYDRODIURIL) 12.5 MG tablet TAKE ONE TABLET EVERY DAY WITH LOSARTAN 50MG    losartan (COZAAR) 50 MG tablet Take 1 tablet (50 mg total) by mouth daily.   Multiple Vitamin (MULTIVITAMIN) capsule Take 1 capsule by mouth daily.    niacin 500 MG tablet Take 500 mg by mouth 2 (two) times daily with a meal.    PFIZER COVID-19 VAC BIVALENT injection    sertraline (ZOLOFT) 50 MG tablet Take 1 tablet (50 mg total) by mouth every morning.   traZODone (DESYREL) 100 MG tablet Take 1 tablet (100 mg total) by mouth at bedtime as needed for sleep.   No facility-administered encounter medications on file as of 02/19/2022.    Allergies (verified) Oxycodone-acetaminophen   History: Past Medical History:  Diagnosis Date   Anxiety    not an issue anymore. this was due to broken ankle   Arthritis    BACK   Complication of anesthesia    SLOW TO WAKE UP X 1 WITH ANKLE SURGERY   GERD (gastroesophageal reflux disease)    OCC-NO MEDS   History of kidney stones    Hypertension 02/19/2017   Hypertriglyceridemia 06/25/2016   Osteopenia  06/25/2016   PONV (postoperative nausea and vomiting)    Past Surgical History:  Procedure Laterality Date   ANKLE FRACTURE SURGERY Left 2012   CHOLECYSTECTOMY     COLONOSCOPY WITH PROPOFOL N/A 05/05/2020   Procedure: COLONOSCOPY WITH PROPOFOL;  Surgeon: Toledo, Benay Pike, MD;  Location: [facility] ENDOSCOPY;  Service: Gastroenterology;  Laterality: N/A;   CYSTOSCOPY/URETEROSCOPY/HOLMIUM LASER/STENT PLACEMENT Left 09/05/2018   Procedure: CYSTOSCOPY/URETEROSCOPY/HOLMIUM LASER/STENT PLACEMENT;  Surgeon: Billey Co, MD;  Location: [facility]  ORS;  Service: Urology;  Laterality: Left;   EXTRACORPOREAL SHOCK WAVE LITHOTRIPSY Left 08/21/2018   Procedure: EXTRACORPOREAL SHOCK WAVE LITHOTRIPSY (ESWL);  Surgeon: Abbie Sons, MD;  Location: [facility] ORS;  Service: Urology;  Laterality: Left;   FOOT SURGERY Right    neuroma removed   LITHOTRIPSY  1990'S   TUBAL LIGATION     VEIN LIGATION AND STRIPPING Left    Family History  Problem Relation Age of Onset   Breast cancer Maternal Aunt 31   Kidney disease Mother    Alzheimer's disease Mother    Cancer Father    Social History   Socioeconomic History   Marital status: Married    Spouse name: Clair Gulling   Number of children: 6   Years of education: Not on file   Highest education level: Not on file  Occupational History    Comment: retired  Tobacco Use   Smoking status: Never   Smokeless tobacco: Never  Vaping Use   Vaping Use: Never used  Substance and Sexual Activity   Alcohol use: No   Drug use: No   Sexual activity: Not on file  Other Topics Concern   Not on file  Social History Narrative   Not on file   Social Determinants of Health   Financial Resource Strain: Not on file  Food Insecurity: Not on file  Transportation Needs: Not on file  Physical Activity: Not on file  Stress: Not on file  Social Connections: Not on file    Tobacco Counseling Counseling given: Not Answered   Clinical Intake:  Pre-visit preparation completed: Yes  Pain : No/denies pain     Nutritional Risks: None Diabetes: No  How often do you need to have someone help you when you read instructions, pamphlets, or other written materials from your doctor or pharmacy?: 1 - Never  Diabetic?no  Interpreter Needed?: No  Information entered by :: Kirke Shaggy, LPN   Activities of Daily Living In your present state of health, do you have any difficulty performing the following activities: 07/13/2021 05/12/2021  Hearing? N N  Vision? N N  Difficulty concentrating or making  decisions? N N  Walking or climbing stairs? N N  Dressing or bathing? N N  Doing errands, shopping? N N  Some recent data might be hidden    Patient Care Team: Virginia Crews, MD as PCP - General (Family Medicine)  Indicate any recent Medical Services you may have received from other than [facility] providers in the past year (date may be approximate).     Assessment:   This is a routine wellness examination for [place].  Hearing/Vision screen No results found.  Dietary issues and exercise activities discussed:     Goals Addressed   None    Depression Screen PHQ 2/9 Scores 07/13/2021 05/12/2021 11/29/2020 08/25/2020 11/05/2019 06/24/2019 05/26/2019  PHQ - 2 Score 0 0 1 0 0 0 0  PHQ- 9 Score 2 3 3  0 2 0 -  Exception Documentation - - - - -  Other- indicate reason in comment box -    Fall Risk Fall Risk  07/13/2021 05/12/2021 11/29/2020 11/05/2019 05/26/2019  Falls in the past year? 1 1 0 0 0  Number falls in past yr: 0 0 0 0 -  Injury with Fall? 0 0 0 0 -  Risk for fall due to : No Fall Risks History of fall(s);No Fall Risks No Fall Risks - -  Follow up - Falls evaluation completed;Education provided Falls evaluation completed - -    FALL RISK PREVENTION PERTAINING TO THE HOME:  Any stairs in or around the home? Yes  If so, are there any without handrails? No  Home free of loose throw rugs in walkways, pet beds, electrical cords, etc? Yes  Adequate lighting in your home to reduce risk of falls? Yes   ASSISTIVE DEVICES UTILIZED TO PREVENT FALLS:  Life alert? Yes  Use of a cane, walker or w/c? No  Grab bars in the bathroom? No  Shower chair or bench in shower? Yes  Elevated toilet seat or a handicapped toilet? Yes   Cognitive Function:Normal cognitive status assessed by direct observation by this Nurse Health Advisor. No abnormalities found.          Immunizations Immunization History  Administered Date(s) Administered   Influenza,inj,Quad PF,6+ Mos 11/05/2019    Influenza-Unspecified 09/27/2018, 11/24/2020, 10/11/2021   PFIZER Comirnaty(Gray Top)Covid-19 Tri-Sucrose Vaccine 10/20/2020   PFIZER(Purple Top)SARS-COV-2 Vaccination 02/26/2020, 03/18/2020, 04/27/2020, 05/18/2020   PNEUMOCOCCAL CONJUGATE-20 01/18/2022   Pfizer Covid-19 Vaccine Bivalent Booster 73yrs & up 11/14/2021   Pneumococcal Conjugate-13 08/24/2020   Td 09/30/2018   Tdap 09/30/2018   Zoster, Live 06/18/2011    TDAP status: Up to date  Flu Vaccine status: Up to date  Pneumococcal vaccine status: Up to date  Covid-19 vaccine status: Completed vaccines  Qualifies for Shingles Vaccine? Yes   Zostavax completed Yes   Shingrix Completed?: No.    Education has been provided regarding the importance of this vaccine. Patient has been advised to call insurance company to determine out of pocket expense if they have not yet received this vaccine. Advised may also receive vaccine at local pharmacy or Health Dept. Verbalized acceptance and understanding.  Screening Tests Health Maintenance  Topic Date Due   Zoster Vaccines- Shingrix (1 of 2) Never done   MAMMOGRAM  02/23/2022   DEXA SCAN  02/23/2025   TETANUS/TDAP  09/30/2028   COLONOSCOPY (Pts 45-42yrs Insurance coverage will need to be confirmed)  05/06/2030   Pneumonia Vaccine 47+ Years old  Completed   INFLUENZA VACCINE  Completed   COVID-19 Vaccine  Completed   Hepatitis C Screening  Completed   HPV VACCINES  Aged Out    Health Maintenance  Health Maintenance Due  Topic Date Due   Zoster Vaccines- Shingrix (1 of 2) Never done    Colorectal cancer screening: Type of screening: Colonoscopy. Completed 05/06/20. Repeat every 10 years  Mammogram status: Completed 02/24/20, has appointment at end of March. Repeat every year  Bone Density status: Completed 02/24/20. Results reflect: Bone density results: NORMAL. Repeat every 5 years.  Lung Cancer Screening: (Low Dose CT Chest recommended if Age 88-80 years, 30 pack-year  currently smoking OR have quit w/in 15years.) does not qualify.    Additional Screening:  Hepatitis C Screening: does qualify; Completed 09/24/17  Vision Screening: Recommended annual ophthalmology exams for early detection of glaucoma and other disorders of the eye. Is the patient up to date with their annual eye exam?  Yes  Who is the provider or what is the name of the office in which the patient attends annual eye exams? Dr.Bell If pt is not established with a provider, would they like to be referred to a provider to establish care? No .   Dental Screening: Recommended annual dental exams for proper oral hygiene  Community Resource Referral / Chronic Care Management: CRR required this visit?  No   CCM required this visit?  No      Plan:     I have personally reviewed and noted the following in the patients chart:   Medical and social history Use of alcohol, tobacco or illicit drugs  Current medications and supplements including opioid prescriptions.  Functional ability and status Nutritional status Physical activity Advanced directives List of other physicians Hospitalizations, surgeries, and ER visits in previous 12 months Vitals Screenings to include cognitive, depression, and falls Referrals and appointments  In addition, I have reviewed and discussed with patient certain preventive protocols, quality metrics, and best practice recommendations. A written personalized care plan for preventive services as well as general preventive health recommendations were provided to patient.     Dionisio David, LPN   [id number]/51/[id number]   Nurse Notes: none

## 2022-02-19 NOTE — Patient Instructions (Addendum)
[name] [name] , Thank you for taking time to come for your Medicare Wellness Visit. I appreciate your ongoing commitment to your health goals. Please review the following plan we discussed and let me know if I can assist you in the future.   Screening recommendations/referrals: Colonoscopy: 05/06/20 Mammogram: 02/24/20, has appointment at end of March Bone Density: 02/24/20 Recommended yearly ophthalmology/optometry visit for glaucoma screening and checkup Recommended yearly dental visit for hygiene and checkup  Vaccinations: Influenza vaccine: had at local pharmacy Pneumococcal vaccine: 08/24/20 Tdap vaccine: 09/30/18 Shingles vaccine: n/d   Covid-19:02/26/20, 03/18/20, 04/27/20, 05/18/20, 10/20/20, 11/14/21  Advanced directives: no  Conditions/risks identified: none  Next appointment: Follow up in one year for your annual wellness visit - 02/20/23 @ 3pm by phone   Preventive Care 65 Years and Older, Female Preventive care refers to lifestyle choices and visits with your health care provider that can promote health and wellness. What does preventive care include? A yearly physical exam. This is also called an annual well check. Dental exams once or twice a year. Routine eye exams. Ask your health care provider how often you should have your eyes checked. Personal lifestyle choices, including: Daily care of your teeth and gums. Regular physical activity. Eating a healthy diet. Avoiding tobacco and drug use. Limiting alcohol use. Practicing safe sex. Taking low-dose aspirin every day. Taking vitamin and mineral supplements as recommended by your health care provider. What happens during an annual well check? The services and screenings done by your health care provider during your annual well check will depend on your age, overall health, lifestyle risk factors, and family history of disease. Counseling  Your health care provider may ask you questions about your: Alcohol use. Tobacco  use. Drug use. Emotional well-being. Home and relationship well-being. Sexual activity. Eating habits. History of falls. Memory and ability to understand (cognition). Work and work Statistician. Reproductive health. Screening  You may have the following tests or measurements: Height, weight, and BMI. Blood pressure. Lipid and cholesterol levels. These may be checked every 5 years, or more frequently if you are over 31 years old. Skin check. Lung cancer screening. You may have this screening every year starting at age 80 if you have a 30-pack-year history of smoking and currently smoke or have quit within the past 15 years. Fecal occult blood test (FOBT) of the stool. You may have this test every year starting at age 88. Flexible sigmoidoscopy or colonoscopy. You may have a sigmoidoscopy every 5 years or a colonoscopy every 10 years starting at age 73. Hepatitis C blood test. Hepatitis B blood test. Sexually transmitted disease (STD) testing. Diabetes screening. This is done by checking your blood sugar (glucose) after you have not eaten for a while (fasting). You may have this done every 1-3 years. Bone density scan. This is done to screen for osteoporosis. You may have this done starting at age 28. Mammogram. This may be done every 1-2 years. Talk to your health care provider about how often you should have regular mammograms. Talk with your health care provider about your test results, treatment options, and if necessary, the need for more tests. Vaccines  Your health care provider may recommend certain vaccines, such as: Influenza vaccine. This is recommended every year. Tetanus, diphtheria, and acellular pertussis (Tdap, Td) vaccine. You may need a Td booster every 10 years. Zoster vaccine. You may need this after age 75. Pneumococcal 13-valent conjugate (PCV13) vaccine. One dose is recommended after age 71. Pneumococcal polysaccharide (PPSV23) vaccine.  One dose is recommended  after age 49. Talk to your health care provider about which screenings and vaccines you need and how often you need them. This information is not intended to replace advice given to you by your health care provider. Make sure you discuss any questions you have with your health care provider. Document Released: 01/06/2016 Document Revised: 08/29/2016 Document Reviewed: 10/11/2015 Elsevier Interactive Patient Education  2017 [place] Prevention in the Home Falls can cause injuries. They can happen to people of all ages. There are many things you can do to make your home safe and to help prevent falls. What can I do on the outside of my home? Regularly fix the edges of walkways and driveways and fix any cracks. Remove anything that might make you trip as you walk through a door, such as a raised step or threshold. Trim any bushes or trees on the path to your home. Use bright outdoor lighting. Clear any walking paths of anything that might make someone trip, such as rocks or tools. Regularly check to see if handrails are loose or broken. Make sure that both sides of any steps have handrails. Any raised decks and porches should have guardrails on the edges. Have any leaves, snow, or ice cleared regularly. Use sand or salt on walking paths during winter. Clean up any spills in your garage right away. This includes oil or grease spills. What can I do in the bathroom? Use night lights. Install grab bars by the toilet and in the tub and shower. Do not use towel bars as grab bars. Use non-skid mats or decals in the tub or shower. If you need to sit down in the shower, use a plastic, non-slip stool. Keep the floor dry. Clean up any water that spills on the floor as soon as it happens. Remove soap buildup in the tub or shower regularly. Attach bath mats securely with double-sided non-slip rug tape. Do not have throw rugs and other things on the floor that can make you trip. What can I do  in the bedroom? Use night lights. Make sure that you have a light by your bed that is easy to reach. Do not use any sheets or blankets that are too big for your bed. They should not hang down onto the floor. Have a firm chair that has side arms. You can use this for support while you get dressed. Do not have throw rugs and other things on the floor that can make you trip. What can I do in the kitchen? Clean up any spills right away. Avoid walking on wet floors. Keep items that you use a lot in easy-to-reach places. If you need to reach something above you, use a strong step stool that has a grab bar. Keep electrical cords out of the way. Do not use floor polish or wax that makes floors slippery. If you must use wax, use non-skid floor wax. Do not have throw rugs and other things on the floor that can make you trip. What can I do with my stairs? Do not leave any items on the stairs. Make sure that there are handrails on both sides of the stairs and use them. Fix handrails that are broken or loose. Make sure that handrails are as long as the stairways. Check any carpeting to make sure that it is firmly attached to the stairs. Fix any carpet that is loose or worn. Avoid having throw rugs at the top or bottom of  the stairs. If you do have throw rugs, attach them to the floor with carpet tape. Make sure that you have a light switch at the top of the stairs and the bottom of the stairs. If you do not have them, ask someone to add them for you. What else can I do to help prevent falls? Wear shoes that: Do not have high heels. Have rubber bottoms. Are comfortable and fit you well. Are closed at the toe. Do not wear sandals. If you use a stepladder: Make sure that it is fully opened. Do not climb a closed stepladder. Make sure that both sides of the stepladder are locked into place. Ask someone to hold it for you, if possible. Clearly mark and make sure that you can see: Any grab bars or  handrails. First and last steps. Where the edge of each step is. Use tools that help you move around (mobility aids) if they are needed. These include: Canes. Walkers. Scooters. Crutches. Turn on the lights when you go into a dark area. Replace any light bulbs as soon as they burn out. Set up your furniture so you have a clear path. Avoid moving your furniture around. If any of your floors are uneven, fix them. If there are any pets around you, be aware of where they are. Review your medicines with your doctor. Some medicines can make you feel dizzy. This can increase your chance of falling. Ask your doctor what other things that you can do to help prevent falls. This information is not intended to replace advice given to you by your health care provider. Make sure you discuss any questions you have with your health care provider. Document Released: 10/06/2009 Document Revised: 05/17/2016 Document Reviewed: 01/14/2015 Elsevier Interactive Patient Education  2017 [employer].

## 2022-03-12 ENCOUNTER — Other Ambulatory Visit: Payer: Self-pay

## 2022-03-12 ENCOUNTER — Ambulatory Visit
Admission: RE | Admit: 2022-03-12 | Discharge: 2022-03-12 | Disposition: A | Discharge status: Home / Self Care | Payer: No Typology Code available for payment source | Source: Ambulatory Visit | Attending: Family Medicine | Admitting: Family Medicine

## 2022-03-12 DIAGNOSIS — Z1231 Encounter for screening mammogram for malignant neoplasm of breast: Secondary | ICD-10-CM | POA: Diagnosis not present

## 2022-07-19 ENCOUNTER — Encounter: Payer: Self-pay | Admitting: Family Medicine

## 2022-07-19 ENCOUNTER — Ambulatory Visit: Payer: No Typology Code available for payment source | Admitting: Family Medicine

## 2022-07-19 ENCOUNTER — Ambulatory Visit (INDEPENDENT_AMBULATORY_CARE_PROVIDER_SITE_OTHER): Payer: No Typology Code available for payment source | Admitting: Family Medicine

## 2022-07-19 VITALS — BP 134/75 | HR 68 | Resp 16 | Ht 64.5 in | Wt 191.6 lb

## 2022-07-19 DIAGNOSIS — F3342 Major depressive disorder, recurrent, in full remission: Secondary | ICD-10-CM | POA: Diagnosis not present

## 2022-07-19 DIAGNOSIS — E781 Pure hyperglyceridemia: Secondary | ICD-10-CM | POA: Diagnosis not present

## 2022-07-19 DIAGNOSIS — I471 Supraventricular tachycardia: Secondary | ICD-10-CM | POA: Diagnosis not present

## 2022-07-19 DIAGNOSIS — F5104 Psychophysiologic insomnia: Secondary | ICD-10-CM

## 2022-07-19 DIAGNOSIS — R7303 Prediabetes: Secondary | ICD-10-CM

## 2022-07-19 DIAGNOSIS — Z0000 Encounter for general adult medical examination without abnormal findings: Secondary | ICD-10-CM | POA: Diagnosis not present

## 2022-07-19 DIAGNOSIS — I10 Essential (primary) hypertension: Secondary | ICD-10-CM | POA: Diagnosis not present

## 2022-07-19 MED ORDER — SERTRALINE HCL 50 MG PO TABS: 50.0000 mg | ORAL_TABLET | ORAL | 3 refills | Status: DC

## 2022-07-19 NOTE — Assessment & Plan Note (Signed)
Chronic, stable Controlled with diet/exercise Repeat a1c Continue to recommend balanced, lower carb meals. Smaller meal size, adding snacks. Choosing water as drink of choice and increasing purposeful exercise.

## 2022-07-19 NOTE — Assessment & Plan Note (Signed)
Chronic, stable 134/75 Goal <140/<90 Denies CP Denies SOB/ DOE Denies low blood pressure/hypotension Denies vision changes No LE Edema noted on exam Continue medication, losartan 50, hctz 12.5, dilt 30 tid prn  Denies side effects Seek emergent care if you develop chest pain or chest pressure

## 2022-07-19 NOTE — Assessment & Plan Note (Signed)
Chronic, stable Previously followed by Nicolasa Ducking MD Psychiatry  Continues to use zoloft 50 mg

## 2022-07-19 NOTE — Progress Notes (Signed)
Complete physical exam  I,[name] [name],acting as a scribe for [name] Sprout, [name] [name].,have documented all relevant documentation on the behalf of [name] Sprout, [name] [name],as directed by  [name] Sprout, [name] [name] while in the presence of [name] Sprout, [name] [name].   Patient: [name] [name]   DOB: 05/18/1956   66 y.o. Female  MRN: [id number] Visit Date: 07/19/2022  Today's healthcare provider: Gwyneth Sprout, [name] [name]  Re Introduced to nurse practitioner role and practice setting.  All questions answered.  Discussed provider/patient relationship and expectations.   Chief Complaint  Patient presents with   Annual Exam   Subjective    [name] [name] is a 66 y.o. female who presents today for a complete physical exam.   HPI  Had AWV with [facility] 02/19/2022  Past Medical History:  Diagnosis Date   Anxiety    not an issue anymore. this was due to broken ankle   Arthritis    BACK   Complication of anesthesia    SLOW TO WAKE UP X 1 WITH ANKLE SURGERY   GERD (gastroesophageal reflux disease)    OCC-NO MEDS   History of kidney stones    Hypertension 02/19/2017   Hypertriglyceridemia 06/25/2016   Osteopenia 06/25/2016   PONV (postoperative nausea and vomiting)    Past Surgical History:  Procedure Laterality Date   ANKLE FRACTURE SURGERY Left 2012   CHOLECYSTECTOMY     COLONOSCOPY WITH PROPOFOL N/A 05/05/2020   Procedure: COLONOSCOPY WITH PROPOFOL;  Surgeon: Toledo, Benay Pike, MD;  Location: [facility];  Service: Gastroenterology;  Laterality: N/A;   CYSTOSCOPY/URETEROSCOPY/HOLMIUM LASER/STENT PLACEMENT Left 09/05/2018   Procedure: CYSTOSCOPY/URETEROSCOPY/HOLMIUM LASER/STENT PLACEMENT;  Surgeon: Billey Co, MD;  Location: [facility] OR[facility];  Service: Urology;  Laterality: Left;   EXTRACORPOREAL SHOCK WAVE LITHOTRIPSY Left 08/21/2018   Procedure: EXTRACORPOREAL SHOCK WAVE LITHOTRIPSY (ESWL);  Surgeon: Abbie Sons, MD;  Location: [facility] OR[facility];  Service: Urology;  Laterality: Left;   FOOT SURGERY Right     neuroma removed   LITHOTRIPSY  1990'S   TUBAL LIGATION     VEIN LIGATION AND STRIPPING Left    Social History   Socioeconomic History   Marital status: Married    Spouse name: Clair Gulling   Number of children: 6   Years of education: Not on file   Highest education level: Not on file  Occupational History    Comment: retired  Tobacco Use   Smoking status: Never   Smokeless tobacco: Never  Vaping Use   Vaping Use: Never used  Substance and Sexual Activity   Alcohol use: No   Drug use: No   Sexual activity: Not on file  Other Topics Concern   Not on file  Social History Narrative   Not on file   Social Determinants of Health   Financial Resource Strain: Low Risk  (02/19/2022)   Overall Financial Resource Strain (CARDIA)    Difficulty of Paying Living Expenses: Not hard at all  Food Insecurity: No Food Insecurity (02/19/2022)   Hunger Vital Sign    Worried About Running Out of Food in the Last Year: Never true    Ran Out of Food in the Last Year: Never true  Transportation Needs: No Transportation Needs (02/19/2022)   PRAPARE - Hydrologist (Medical): No    Lack of Transportation (Non-Medical): No  Physical Activity: Insufficiently Active (02/19/2022)   Exercise Vital Sign    Days of Exercise per Week: 2 days  Minutes of Exercise per Session: 20 min  Stress: No Stress Concern Present (02/19/2022)   [place]    Feeling of Stress : Not at all  Social Connections: Moderately Integrated (02/19/2022)   Social Connection and Isolation Panel [NHANES]    Frequency of Communication with Friends and Family: More than three times a week    Frequency of Social Gatherings with Friends and Family: Three times a week    Attends Religious Services: More than 4 times per year    Active Member of Clubs or Organizations: No    Attends Archivist Meetings: Never    Marital Status: Married   Human resources officer Violence: Not At Risk (02/19/2022)   Humiliation, Afraid, Rape, and Kick questionnaire    Fear of Current or Ex-Partner: No    Emotionally Abused: No    Physically Abused: No    Sexually Abused: No   Family Status  Relation Name Status   Mat Aunt  (Not Specified)   Mother  Deceased   Father  Deceased       lung cancer   Sister  Alive   Brother  Alive   Sister  Deceased at age 8       respiratory   Family History  Problem Relation Age of Onset   Breast cancer Maternal Aunt 3   Kidney disease Mother    Alzheimer's disease Mother    Cancer Father    Allergies  Allergen Reactions   Oxycodone-Acetaminophen Other (See Comments)    Made her feel like "she was losing her mind". Unsure if the reaction was due to percocet or stress at the time.    Patient Care Team: Virginia Crews, MD as PCP - General (Family Medicine)   Medications: Outpatient Medications Prior to Visit  Medication Sig   calcium-vitamin D (OSCAL WITH D) 500-200 MG-UNIT tablet Take 1 tablet by mouth See admin instructions. Take 2 tablets by mouth daily in the morning and 1 tablet in the evening (Patient not taking: Reported on 02/19/2022)   diltiazem (CARDIZEM) 30 MG tablet Take 1 tablet (30 mg total) by mouth 3 (three) times daily as needed. For fast heart rate   FLUZONE HIGH-DOSE QUADRIVALENT 0.7 ML SUSY    hydrochlorothiazide (HYDRODIURIL) 12.5 MG tablet TAKE ONE TABLET EVERY DAY WITH LOSARTAN 50MG   losartan (COZAAR) 50 MG tablet Take 1 tablet (50 mg total) by mouth daily.   Multiple Vitamin (MULTIVITAMIN) capsule Take 1 capsule by mouth daily.    niacin 500 MG tablet Take 500 mg by mouth 2 (two) times daily with a meal.    PFIZER COVID-19 VAC BIVALENT injection    traZODone (DESYREL) 100 MG tablet Take 1 tablet (100 mg total) by mouth at bedtime as needed for sleep.   [DISCONTINUED] sertraline (ZOLOFT) 50 MG tablet Take 1 tablet (50 mg total) by mouth every morning.   No  facility-administered medications prior to visit.    Review of Systems  Constitutional: Negative.   HENT: Negative.    Eyes: Negative.   Respiratory: Negative.    Cardiovascular: Negative.   Gastrointestinal: Negative.   Endocrine: Negative.   Genitourinary: Negative.   Musculoskeletal: Negative.   Skin: Negative.   Allergic/Immunologic: Negative.   Neurological: Negative.   Hematological: Negative.   Psychiatric/Behavioral: Negative.      Last CBC Lab Results  Component Value Date   WBC 5.8 01/19/2022   HGB 13.5 01/19/2022   HCT 40.5 01/19/2022  MCV 87 01/19/2022   MCH 29.0 01/19/2022   RDW 13.0 01/19/2022   PLT 261 57/12/7791   Last metabolic panel Lab Results  Component Value Date   GLUCOSE 105 (H) 01/19/2022   NA 141 01/19/2022   K 4.3 01/19/2022   CL 102 01/19/2022   CO2 28 01/19/2022   BUN 19 01/19/2022   CREATININE 1.03 (H) 01/19/2022   EGFR 60 01/19/2022   CALCIUM 10.4 (H) 01/19/2022   PROT 7.2 01/19/2022   ALBUMIN 4.5 01/19/2022   LABGLOB 2.7 01/19/2022   AGRATIO 1.7 01/19/2022   BILITOT 0.4 01/19/2022   ALKPHOS 76 01/19/2022   AST 26 01/19/2022   ALT 22 01/19/2022   ANIONGAP 6 08/28/2018   Last lipids Lab Results  Component Value Date   CHOL 217 (H) 01/19/2022   HDL 59 01/19/2022   LDLCALC 137 (H) 01/19/2022   TRIG 116 01/19/2022   CHOLHDL 3.7 01/19/2022   Last hemoglobin A1c Lab Results  Component Value Date   HGBA1C 6.1 (H) 01/19/2022   Last thyroid functions Lab Results  Component Value Date   TSH 1.890 05/12/2021     Objective     BP 134/75 (BP Location: Left Arm, Patient Position: Sitting, Cuff Size: Large)   Pulse 68   Resp 16   Ht 5' 4.5" (1.638 m)   Wt 191 lb 9.6 oz (86.9 kg)   BMI 32.38 kg/m   BP Readings from Last 3 Encounters:  07/19/22 134/75  01/19/22 112/80  01/18/22 126/83   Wt Readings from Last 3 Encounters:  07/19/22 191 lb 9.6 oz (86.9 kg)  01/19/22 187 lb (84.8 kg)  01/18/22 187 lb (84.8 kg)    SpO2 Readings from Last 3 Encounters:  01/19/22 98%  01/18/22 99%  01/05/22 96%   Physical Exam Vitals and nursing note reviewed.  Constitutional:      General: She is awake. She is not in acute distress.    Appearance: Normal appearance. She is well-developed and well-groomed. She is obese. She is not ill-appearing, toxic-appearing or diaphoretic.  HENT:     Head: Normocephalic and atraumatic.     Jaw: There is normal jaw occlusion. No trismus, tenderness, swelling or pain on movement.     Right Ear: Hearing, tympanic membrane, ear canal and external ear normal. There is no impacted cerumen.     Left Ear: Hearing, tympanic membrane, ear canal and external ear normal. There is no impacted cerumen.     Nose: Nose normal. No congestion or rhinorrhea.     Right Turbinates: Not enlarged, swollen or pale.     Left Turbinates: Not enlarged, swollen or pale.     Right Sinus: No maxillary sinus tenderness or frontal sinus tenderness.     Left Sinus: No maxillary sinus tenderness or frontal sinus tenderness.     Mouth/Throat:     Lips: Pink.     Mouth: Mucous membranes are moist. No injury.     Tongue: No lesions.     Pharynx: Oropharynx is clear. Uvula midline. No pharyngeal swelling, oropharyngeal exudate, posterior oropharyngeal erythema or uvula swelling.     Tonsils: No tonsillar exudate or tonsillar abscesses.  Eyes:     General: Lids are normal. Lids are everted, no foreign bodies appreciated. Vision grossly intact. Gaze aligned appropriately. No allergic shiner or visual field deficit.       Right eye: No discharge.        Left eye: No discharge.     Extraocular Movements: Extraocular  movements intact.     Conjunctiva/sclera: Conjunctivae normal.     Right eye: Right conjunctiva is not injected. No exudate.    Left eye: Left conjunctiva is not injected. No exudate.    Pupils: Pupils are equal, round, and reactive to light.     Comments: Bifocal/progressive glasses  Neck:      Thyroid: No thyroid mass, thyromegaly or thyroid tenderness.     Vascular: No carotid bruit.     Trachea: Trachea normal.  Cardiovascular:     Rate and Rhythm: Normal rate and regular rhythm.     Pulses: Normal pulses.          Carotid pulses are 2+ on the right side and 2+ on the left side.      Radial pulses are 2+ on the right side and 2+ on the left side.       Dorsalis pedis pulses are 2+ on the right side and 2+ on the left side.       Posterior tibial pulses are 2+ on the right side and 2+ on the left side.     Heart sounds: Normal heart sounds, S1 normal and S2 normal. No murmur heard.    No friction rub. No gallop.  Pulmonary:     Effort: Pulmonary effort is normal. No respiratory distress.     Breath sounds: Normal breath sounds and air entry. No stridor. No wheezing, rhonchi or rales.  Chest:     Chest wall: No tenderness.     Comments: Breasts: risk and benefit of breast self-exam was discussed, not examined Mammo due 2/24 Abdominal:     General: Abdomen is flat. Bowel sounds are normal. There is no distension.     Palpations: Abdomen is soft. There is no mass.     Tenderness: There is no abdominal tenderness. There is no right CVA tenderness, left CVA tenderness, guarding or rebound.     Hernia: No hernia is present.  Genitourinary:    Comments: Exam deferred; denies complaints Musculoskeletal:        General: No swelling, tenderness, deformity or signs of injury. Normal range of motion.     Cervical back: Full passive range of motion without pain, normal range of motion and neck supple. No edema, rigidity or tenderness. No muscular tenderness.     Right lower leg: No edema.     Left lower leg: No edema.  Lymphadenopathy:     Cervical: No cervical adenopathy.     Right cervical: No superficial, deep or posterior cervical adenopathy.    Left cervical: No superficial, deep or posterior cervical adenopathy.  Skin:    General: Skin is warm and dry.     Capillary Refill:  Capillary refill takes less than 2 seconds.     Coloration: Skin is not jaundiced or pale.     Findings: No bruising, erythema, lesion or rash.  Neurological:     General: No focal deficit present.     Mental Status: She is alert and oriented to person, place, and time. Mental status is at baseline.     GCS: GCS eye subscore is 4. GCS verbal subscore is 5. GCS motor subscore is 6.     Sensory: Sensation is intact. No sensory deficit.     Motor: Motor function is intact. No weakness.     Coordination: Coordination is intact. Coordination normal.     Gait: Gait is intact. Gait normal.  Psychiatric:        Attention and Perception:  Attention and perception normal.        Mood and Affect: Mood and affect normal.        Speech: Speech normal.        Behavior: Behavior normal. Behavior is cooperative.        Thought Content: Thought content normal.        Cognition and Memory: Cognition and memory normal.        Judgment: Judgment normal.     Last depression screening scores    07/19/2022    8:54 AM 02/19/2022    3:48 PM 07/13/2021    5:00 PM  PHQ 2/9 Scores  PHQ - 2 Score 0 0 0  PHQ- 9 Score   2   Last fall risk screening    07/19/2022    8:53 AM  Fall Risk   Falls in the past year? 0  Number falls in past yr: 0  Injury with Fall? 0   Last Audit-C alcohol use screening    07/19/2022    8:54 AM  Alcohol Use Disorder Test (AUDIT)  1. How often do you have a drink containing alcohol? 0  2. How many drinks containing alcohol do you have on a typical day when you are drinking? 0  3. How often do you have six or more drinks on one occasion? 0  AUDIT-C Score 0   A score of 3 or more in women, and 4 or more in men indicates increased risk for alcohol abuse, EXCEPT if all of the points are from question 1   No results found for any visits on 07/19/22.  Assessment & Plan    Routine Health Maintenance and Physical Exam  Exercise Activities and Dietary recommendations  Goals       DIET - EAT MORE FRUITS AND VEGETABLES        Immunization History  Administered Date(s) Administered   Influenza,inj,Quad PF,6+ Mos 11/05/2019   Influenza-Unspecified 09/27/2018, 11/24/2020, 10/11/2021   PFIZER Comirnaty(Gray Top)Covid-19 Tri-Sucrose Vaccine 10/20/2020   PFIZER(Purple Top)SARS-COV-2 Vaccination 02/26/2020, 03/18/2020, 04/27/2020, 05/18/2020   PNEUMOCOCCAL CONJUGATE-20 01/18/2022   Pfizer Covid-19 Vaccine Bivalent Booster 48yr & up 11/14/2021   Pneumococcal Conjugate-13 08/24/2020   Td 09/30/2018   Tdap 09/30/2018   Zoster, Live 06/18/2011    Health Maintenance  Topic Date Due   Zoster Vaccines- Shingrix (1 of 2) Never done   COVID-19 Vaccine (7 - Pfizer series) 03/14/2022   INFLUENZA VACCINE  07/24/2022   MAMMOGRAM  03/12/2024   DEXA SCAN  02/23/2025   TETANUS/TDAP  09/30/2028   COLONOSCOPY (Pts 45-453yrInsurance coverage will need to be confirmed)  05/06/2030   Pneumonia Vaccine 6553Years old  Completed   Hepatitis C Screening  Completed   HPV VACCINES  Aged Out    Discussed health benefits of physical activity, and encouraged her to engage in regular exercise appropriate for her age and condition.  Problem List Items Addressed This Visit       Cardiovascular and Mediastinum   Primary hypertension    Chronic, stable 134/75 Goal <140/<90 Denies CP Denies SOB/ DOE Denies low blood pressure/hypotension Denies vision changes No LE Edema noted on exam Continue medication, losartan 50, hctz 12.5, dilt 30 tid prn  Denies side effects Seek emergent care if you develop chest pain or chest pressure       PSVT (paroxysmal supraventricular tachycardia) (HCC)    Chronic, intermittent Previously followed by cardiology Use of dilt prn up to 30 mg tid  Pt reports  3-4 incidents since rx was written 6 months ago NSR today        Other   Annual physical exam - Primary    UTD on vision and dental Pap n/a given age Followed [facility]  2031 Plan to order DEXA with mammo for 2/24 at 6 month f/u Things to do to keep yourself healthy  - Exercise at least 30-45 minutes a day, 3-4 days a week.  - Eat a low-fat diet with lots of fruits and vegetables, up to 7-9 servings per day.  - Seatbelts can save your life. Wear them always.  - Smoke detectors on every level of your home, check batteries every year.  - Eye Doctor - have an eye exam every 1-2 years  - Safe sex - if you may be exposed to STDs, use a condom.  - Alcohol -  If you drink, do it moderately, less than 2 drinks per day.  - [place]. Choose someone to speak for you if you are not able.  - Depression is common in our stressful world.If you're feeling down or losing interest in things you normally enjoy, please come in for a visit.  - Violence - If anyone is threatening or hurting you, please call immediately.        Relevant Orders   Lipid panel   Comprehensive Metabolic Panel (CMET)   TSH + free T4   Hypertriglyceridemia    Chronic, stable Not on statin Stroke risk elevated at 10% in 10 years, mod risk The 10-year ASCVD risk score (Arnett DK, et al., 2019) is: 9.2%   Values used to calculate the score:     Age: 68 years     Sex: Female     Is Non-Hispanic African American: No     Diabetic: No     Tobacco smoker: No     Systolic Blood Pressure: 130 mmHg     Is BP treated: Yes     HDL Cholesterol: 59 mg/dL     Total Cholesterol: 217 mg/dL Repeat LP      Prediabetes    Chronic, stable Controlled with diet/exercise Repeat a1c Continue to recommend balanced, lower carb meals. Smaller meal size, adding snacks. Choosing water as drink of choice and increasing purposeful exercise.       Relevant Orders   Hemoglobin A1c   Psychophysiological insomnia    Chronic, intermittent Controlled with use of 100-150 mg of trazodone Declines refills given she does not use this medication routinely       Recurrent major depressive  disorder, in full remission ([place])    Chronic, stable Previously followed by Nicolasa Ducking MD Psychiatry  Continues to use zoloft 50 mg      Relevant Medications   sertraline (ZOLOFT) 50 MG tablet     Return in about 6 months (around 01/19/2023) for chonic disease management.     Vonna Kotyk, [name] [name], have reviewed all documentation for this visit. The documentation on 07/19/22 for the exam, diagnosis, procedures, and orders are all accurate and complete.    [name] [name], [facility] [phone] (phone) [phone] (fax)  [place]

## 2022-07-19 NOTE — Assessment & Plan Note (Signed)
Chronic, intermittent Previously followed by cardiology Use of dilt prn up to 30 mg tid  Pt reports 3-4 incidents since rx was written 6 months ago NSR today

## 2022-07-19 NOTE — Assessment & Plan Note (Signed)
Chronic, intermittent Controlled with use of 100-150 mg of trazodone Declines refills given she does not use this medication routinely

## 2022-07-19 NOTE — Assessment & Plan Note (Signed)
Chronic, stable Not on statin Stroke risk elevated at 10% in 10 years, mod risk The 10-year ASCVD risk score (Arnett DK, et al., 2019) is: 9.2%   Values used to calculate the score:     Age: 66 years     Sex: Female     Is Non-Hispanic African American: No     Diabetic: No     Tobacco smoker: No     Systolic Blood Pressure: 444 mmHg     Is BP treated: Yes     HDL Cholesterol: 59 mg/dL     Total Cholesterol: 217 mg/dL Repeat LP

## 2022-07-19 NOTE — Assessment & Plan Note (Signed)
UTD on vision and dental Pap n/a given age Followed by [facility] for colonoscopy 2031 Plan to order DEXA with mammo for 2/24 at 6 month f/u Things to do to keep yourself healthy  - Exercise at least 30-45 minutes a day, 3-4 days a week.  - Eat a low-fat diet with lots of fruits and vegetables, up to 7-9 servings per day.  - Seatbelts can save your life. Wear them always.  - Smoke detectors on every level of your home, check batteries every year.  - Eye Doctor - have an eye exam every 1-2 years  - Safe sex - if you may be exposed to STDs, use a condom.  - Alcohol -  If you drink, do it moderately, less than 2 drinks per day.  - [place]. Choose someone to speak for you if you are not able.  - Depression is common in our stressful world.If you're feeling down or losing interest in things you normally enjoy, please come in for a visit.  - Violence - If anyone is threatening or hurting you, please call immediately.

## 2022-08-03 NOTE — Progress Notes (Signed)
Spoke with patient and reminded of lab orders.

## 2022-08-13 DIAGNOSIS — L821 Other seborrheic keratosis: Secondary | ICD-10-CM | POA: Diagnosis not present

## 2022-08-13 DIAGNOSIS — D2272 Melanocytic nevi of left lower limb, including hip: Secondary | ICD-10-CM | POA: Diagnosis not present

## 2022-08-13 DIAGNOSIS — D225 Melanocytic nevi of trunk: Secondary | ICD-10-CM | POA: Diagnosis not present

## 2022-08-13 DIAGNOSIS — X32XXXA Exposure to sunlight, initial encounter: Secondary | ICD-10-CM | POA: Diagnosis not present

## 2022-08-13 DIAGNOSIS — L718 Other rosacea: Secondary | ICD-10-CM | POA: Diagnosis not present

## 2022-08-13 DIAGNOSIS — D2261 Melanocytic nevi of right upper limb, including shoulder: Secondary | ICD-10-CM | POA: Diagnosis not present

## 2022-08-13 DIAGNOSIS — D2271 Melanocytic nevi of right lower limb, including hip: Secondary | ICD-10-CM | POA: Diagnosis not present

## 2022-08-13 DIAGNOSIS — D2262 Melanocytic nevi of left upper limb, including shoulder: Secondary | ICD-10-CM | POA: Diagnosis not present

## 2022-08-13 DIAGNOSIS — L218 Other seborrheic dermatitis: Secondary | ICD-10-CM | POA: Diagnosis not present

## 2022-08-13 DIAGNOSIS — L570 Actinic keratosis: Secondary | ICD-10-CM | POA: Diagnosis not present

## 2022-09-14 DIAGNOSIS — G4700 Insomnia, unspecified: Secondary | ICD-10-CM | POA: Diagnosis not present

## 2022-09-14 DIAGNOSIS — Z6832 Body mass index (BMI) 32.0-32.9, adult: Secondary | ICD-10-CM | POA: Diagnosis not present

## 2022-09-14 DIAGNOSIS — Z008 Encounter for other general examination: Secondary | ICD-10-CM | POA: Diagnosis not present

## 2022-09-14 DIAGNOSIS — E669 Obesity, unspecified: Secondary | ICD-10-CM | POA: Diagnosis not present

## 2022-09-14 DIAGNOSIS — I471 Supraventricular tachycardia: Secondary | ICD-10-CM | POA: Diagnosis not present

## 2022-09-14 DIAGNOSIS — F324 Major depressive disorder, single episode, in partial remission: Secondary | ICD-10-CM | POA: Diagnosis not present

## 2022-09-14 DIAGNOSIS — I129 Hypertensive chronic kidney disease with stage 1 through stage 4 chronic kidney disease, or unspecified chronic kidney disease: Secondary | ICD-10-CM | POA: Diagnosis not present

## 2022-09-14 DIAGNOSIS — I700 Atherosclerosis of aorta: Secondary | ICD-10-CM | POA: Diagnosis not present

## 2022-09-14 DIAGNOSIS — N182 Chronic kidney disease, stage 2 (mild): Secondary | ICD-10-CM | POA: Diagnosis not present

## 2022-10-08 DIAGNOSIS — R7303 Prediabetes: Secondary | ICD-10-CM | POA: Diagnosis not present

## 2022-10-08 DIAGNOSIS — Z0000 Encounter for general adult medical examination without abnormal findings: Secondary | ICD-10-CM | POA: Diagnosis not present

## 2022-10-08 DIAGNOSIS — I471 Supraventricular tachycardia, unspecified: Secondary | ICD-10-CM | POA: Diagnosis not present

## 2022-10-08 DIAGNOSIS — E781 Pure hyperglyceridemia: Secondary | ICD-10-CM | POA: Diagnosis not present

## 2022-10-09 LAB — TSH+FREE T4
Free T4: 1 ng/dL (ref 0.82–1.77)
TSH: 0.72 u[IU]/mL (ref 0.450–4.500)

## 2022-10-09 LAB — LIPID PANEL
Chol/HDL Ratio: 3.9 ratio (ref 0.0–4.4)
Cholesterol, Total: 217 mg/dL — ABNORMAL HIGH (ref 100–199)
HDL: 55 mg/dL (ref >39)
LDL Chol Calc (NIH): 138 mg/dL — ABNORMAL HIGH (ref 0–99)
Triglycerides: 135 mg/dL (ref 0–149)
VLDL Cholesterol Cal: 24 mg/dL (ref 5–40)

## 2022-10-09 LAB — HEMOGLOBIN A1C
Est. average glucose Bld gHb Est-mCnc: 120 mg/dL
Hgb A1c MFr Bld: 5.8 % — ABNORMAL HIGH (ref 4.8–5.6)

## 2022-10-09 LAB — COMPREHENSIVE METABOLIC PANEL
ALT: 12 IU/L (ref 0–32)
AST: 17 IU/L (ref 0–40)
Albumin/Globulin Ratio: 1.6 (ref 1.2–2.2)
Albumin: 4.5 g/dL (ref 3.9–4.9)
Alkaline Phosphatase: 90 IU/L (ref 44–121)
BUN/Creatinine Ratio: 15 (ref 12–28)
BUN: 16 mg/dL (ref 8–27)
Bilirubin Total: 0.4 mg/dL (ref 0.0–1.2)
CO2: 26 mmol/L (ref 20–29)
Calcium: 10.4 mg/dL — ABNORMAL HIGH (ref 8.7–10.3)
Chloride: 104 mmol/L (ref 96–106)
Creatinine, Ser: 1.1 mg/dL — ABNORMAL HIGH (ref 0.57–1.00)
Globulin, Total: 2.8 g/dL (ref 1.5–4.5)
Glucose: 106 mg/dL — ABNORMAL HIGH (ref 70–99)
Potassium: 4.3 mmol/L (ref 3.5–5.2)
Sodium: 143 mmol/L (ref 134–144)
Total Protein: 7.3 g/dL (ref 6.0–8.5)
eGFR: 55 mL/min/{1.73_m2} — ABNORMAL LOW (ref >59)

## 2022-10-10 ENCOUNTER — Other Ambulatory Visit: Payer: Self-pay

## 2022-10-10 ENCOUNTER — Other Ambulatory Visit: Payer: Self-pay | Admitting: Family Medicine

## 2022-10-10 DIAGNOSIS — R7989 Other specified abnormal findings of blood chemistry: Secondary | ICD-10-CM

## 2022-10-10 MED ORDER — ROSUVASTATIN CALCIUM 5 MG PO TABS: 5.0000 mg | ORAL_TABLET | Freq: Every day | ORAL | 3 refills | Status: DC

## 2022-10-10 NOTE — Progress Notes (Signed)
A1c is improved; continues to remain in pre-diabetic range at 5.8%  Cholesterol is unchanged- showing elevated bad/LDL and elevated total cholesterol. I  continue to recommend diet low in saturated fat and regular exercise - 30 min at least 5 times per week  Chemistry shows consistent elevation in creatinine- continue to prioritize water to assist. Goal of 48-64 oz/day. Recommend repeat BMP in 2 months.   Thyroid stable  Gwyneth Sprout, [facility] [place] [address] [place], [place] [address] [phone] (phone) [phone] (fax) [place]

## 2022-10-13 ENCOUNTER — Ambulatory Visit
Admission: RE | Admit: 2022-10-13 | Discharge: 2022-10-13 | Disposition: A | Discharge status: Home / Self Care | Payer: No Typology Code available for payment source | Source: Ambulatory Visit | Attending: Emergency Medicine | Admitting: Emergency Medicine

## 2022-10-13 VITALS — BP 129/83 | HR 74 | Temp 98.0°F | Resp 18 | Ht 64.5 in

## 2022-10-13 DIAGNOSIS — R7303 Prediabetes: Secondary | ICD-10-CM | POA: Insufficient documentation

## 2022-10-13 DIAGNOSIS — Z1152 Encounter for screening for COVID-19: Secondary | ICD-10-CM | POA: Insufficient documentation

## 2022-10-13 DIAGNOSIS — J209 Acute bronchitis, unspecified: Secondary | ICD-10-CM | POA: Diagnosis not present

## 2022-10-13 DIAGNOSIS — Z792 Long term (current) use of antibiotics: Secondary | ICD-10-CM | POA: Insufficient documentation

## 2022-10-13 DIAGNOSIS — J0100 Acute maxillary sinusitis, unspecified: Secondary | ICD-10-CM | POA: Diagnosis not present

## 2022-10-13 DIAGNOSIS — I10 Essential (primary) hypertension: Secondary | ICD-10-CM | POA: Diagnosis not present

## 2022-10-13 MED ORDER — AZITHROMYCIN 250 MG PO TABS: 250.0000 mg | ORAL_TABLET | Freq: Every day | ORAL | 0 refills | Status: DC

## 2022-10-13 NOTE — ED Provider Notes (Signed)
[name] [name]    CSN: [id number] Arrival date & time: 10/13/22  1346      History   Chief Complaint Chief Complaint  Patient presents with   Cough    Entered by patient    HPI [name] [name] is a 66 y.o. female.  Patient presents with 1 week history of congestion, ear pain, sore throat, nonproductive cough.  Treatment attempted with OTC sinus medication.  No fever, rash, shortness of breath, vomiting, diarrhea, or other symptoms.  Her medical history includes hypertension and prediabetes.   The history is provided by the patient and medical records.    Past Medical History:  Diagnosis Date   Anxiety    not an issue anymore. this was due to broken ankle   Arthritis    BACK   Complication of anesthesia    SLOW TO WAKE UP X 1 WITH ANKLE SURGERY   GERD (gastroesophageal reflux disease)    OCC-NO MEDS   History of kidney stones    Hypertension 02/19/2017   Hypertriglyceridemia 06/25/2016   Osteopenia 06/25/2016   PONV (postoperative nausea and vomiting)     Patient Active Problem List   Diagnosis Date Noted   Annual physical exam 07/19/2022   Recurrent major depressive disorder, in full remission ([place]) 07/19/2022   Primary hypertension 07/19/2022   PSVT (paroxysmal supraventricular tachycardia) 06/16/2021   Prediabetes 04/01/2019   Hypertriglyceridemia 06/25/2016   Psychophysiological insomnia 03/28/2007    Past Surgical History:  Procedure Laterality Date   ANKLE FRACTURE SURGERY Left 2012   CHOLECYSTECTOMY     COLONOSCOPY WITH PROPOFOL N/A 05/05/2020   Procedure: COLONOSCOPY WITH PROPOFOL;  Surgeon: Toledo, Benay Pike, MD;  Location: [facility];  Service: Gastroenterology;  Laterality: N/A;   CYSTOSCOPY/URETEROSCOPY/HOLMIUM LASER/STENT PLACEMENT Left 09/05/2018   Procedure: CYSTOSCOPY/URETEROSCOPY/HOLMIUM LASER/STENT PLACEMENT;  Surgeon: Billey Co, MD;  Location: [facility] ORS;  Service: Urology;  Laterality: Left;   EXTRACORPOREAL SHOCK WAVE  LITHOTRIPSY Left 08/21/2018   Procedure: EXTRACORPOREAL SHOCK WAVE LITHOTRIPSY (ESWL);  Surgeon: Abbie Sons, MD;  Location: [facility] ORS;  Service: Urology;  Laterality: Left;   FOOT SURGERY Right    neuroma removed   LITHOTRIPSY  1990'S   TUBAL LIGATION     VEIN LIGATION AND STRIPPING Left     OB History   No obstetric history on file.      Home Medications    Prior to Admission medications   Medication Sig Start Date End Date Taking? Authorizing Provider  azithromycin (ZITHROMAX) 250 MG tablet Take 1 tablet (250 mg total) by mouth daily. Take first 2 tablets together, then 1 every day until finished. 10/13/22  Yes Sharion Balloon, NP  calcium-vitamin D (OSCAL WITH D) 500-200 MG-UNIT tablet Take 1 tablet by mouth See admin instructions. Take 2 tablets by mouth daily in the morning and 1 tablet in the evening Patient not taking: Reported on 02/19/2022    [provider]  diltiazem (CARDIZEM) 30 MG tablet Take 1 tablet (30 mg total) by mouth 3 (three) times daily as needed. For fast heart rate 01/19/22   Minna Merritts, MD  FLUZONE HIGH-DOSE QUADRIVALENT 0.7 ML SUSY  10/30/21   [provider]  hydrochlorothiazide (HYDRODIURIL) 12.5 MG tablet TAKE ONE TABLET EVERY DAY WITH LOSARTAN '50MG'$  01/18/22   Bacigalupo, Dionne Bucy, MD  losartan (COZAAR) 50 MG tablet Take 1 tablet (50 mg total) by mouth daily. 01/18/22   Virginia Crews, MD  Multiple Vitamin (MULTIVITAMIN) capsule Take 1 capsule by  mouth daily.     [provider]  niacin 500 MG tablet Take 500 mg by mouth 2 (two) times daily with a meal.     [provider]  PFIZER COVID-19 VAC BIVALENT injection  11/14/21   [provider]  rosuvastatin (CRESTOR) 5 MG tablet Take 1 tablet (5 mg total) by mouth daily. 10/10/22   Gwyneth Sprout, [name]  sertraline (ZOLOFT) 50 MG tablet Take 1 tablet (50 mg total) by mouth every morning. 07/19/22   Gwyneth Sprout, [name]  traZODone (DESYREL) 100 MG tablet Take 1  tablet (100 mg total) by mouth at bedtime as needed for sleep. 08/17/20   Bacigalupo, Dionne Bucy, MD    Family History Family History  Problem Relation Age of Onset   Breast cancer Maternal Aunt 29   Kidney disease Mother    Alzheimer's disease Mother    Cancer Father     Social History Social History   Tobacco Use   Smoking status: Never   Smokeless tobacco: Never  Vaping Use   Vaping Use: Never used  Substance Use Topics   Alcohol use: No   Drug use: No     Allergies   Oxycodone-acetaminophen   Review of Systems Review of Systems  Constitutional:  Negative for chills and fever.  HENT:  Positive for congestion, ear pain, postnasal drip, sinus pressure and sore throat.   Respiratory:  Positive for cough. Negative for shortness of breath.   Cardiovascular:  Negative for chest pain and palpitations.  Gastrointestinal:  Negative for diarrhea and vomiting.  Skin:  Negative for color change and rash.  All other systems reviewed and are negative.    Physical Exam Triage Vital Signs ED Triage Vitals  Enc Vitals Group     BP      Pulse      Resp      Temp      Temp src      SpO2      Weight      Height      Head Circumference      Peak Flow      Pain Score      Pain Loc      Pain Edu?      Excl. in [place]?    No data found.  Updated Vital Signs BP 129/83   Pulse 74   Temp 98 F (36.7 C)   Resp 18   Ht 5' 4.5" (1.638 m)   SpO2 98%   BMI 32.38 kg/m   Visual Acuity Right Eye Distance:   Left Eye Distance:   Bilateral Distance:    Right Eye Near:   Left Eye Near:    Bilateral Near:     Physical Exam Vitals and nursing note reviewed.  Constitutional:      General: She is not in acute distress.    Appearance: Normal appearance. She is well-developed. She is not ill-appearing.  HENT:     Right Ear: Tympanic membrane normal.     Left Ear: Tympanic membrane normal.     Nose: Congestion present.     Mouth/Throat:     Mouth: Mucous membranes are  moist.     Pharynx: Oropharynx is clear.  Cardiovascular:     Rate and Rhythm: Normal rate and regular rhythm.     Heart sounds: Normal heart sounds.  Pulmonary:     Effort: Pulmonary effort is normal. No respiratory distress.     Breath sounds: Normal breath  sounds.  Musculoskeletal:     Cervical back: Neck supple.  Skin:    General: Skin is warm and dry.  Neurological:     Mental Status: She is alert.  Psychiatric:        Mood and Affect: Mood normal.        Behavior: Behavior normal.      UC Treatments / Results  Labs (all labs ordered are listed, but only abnormal results are displayed) Labs Reviewed  SARS CORONAVIRUS 2 (TAT 6-24 HRS)    EKG   Radiology No results found.  Procedures Procedures (including critical care time)  Medications Ordered in UC Medications - No data to display  Initial Impression / Assessment and Plan / UC Course  I have reviewed the triage vital signs and the nursing notes.  Pertinent labs & imaging results that were available during my care of the patient were reviewed by me and considered in my medical decision making (see chart for details).    Acute bronchitis, acute sinusitis.  Patient has been symptomatic for a week and is not improving with OTC treatment.  Treating today with Zithromax.  COVID pending.  Discussed symptomatic treatment including Tylenol or ibuprofen, rest, hydration.  Instructed patient to follow up with her PCP if symptoms are not improving.  She agrees to plan of care.   Final Clinical Impressions(s) / UC Diagnoses   Final diagnoses:  Acute bronchitis, unspecified organism  Acute non-recurrent maxillary sinusitis     Discharge Instructions      Take the Zithromax as directed.  Follow up with your primary care provider if your symptoms are not improving.        ED Prescriptions     Medication Sig Dispense Auth. Provider   azithromycin (ZITHROMAX) 250 MG tablet Take 1 tablet (250 mg total) by mouth  daily. Take first 2 tablets together, then 1 every day until finished. 6 tablet Sharion Balloon, NP      PDMP not reviewed this encounter.   Sharion Balloon, NP 10/13/22 1414

## 2022-10-13 NOTE — Discharge Instructions (Addendum)
Take the Zithromax as directed.  Follow up with your primary care provider if your symptoms are not improving.    

## 2022-10-13 NOTE — ED Triage Notes (Signed)
Patient to Urgent Care with complaints of cough that started 1.5 weeks ago. Reports her husband is sick with the same symptoms and is currently on abx and steroids.   Cough is dry and unproductive. Reports headache and pain from coughing.

## 2022-10-14 LAB — SARS CORONAVIRUS 2 (TAT 6-24 HRS): SARS Coronavirus 2: NEGATIVE

## 2022-10-30 DIAGNOSIS — H524 Presbyopia: Secondary | ICD-10-CM | POA: Diagnosis not present

## 2022-11-06 ENCOUNTER — Ambulatory Visit: Payer: No Typology Code available for payment source | Admitting: Family Medicine

## 2022-11-07 NOTE — Progress Notes (Unsigned)
   I,Kirstie Larsen S Ivey Cina,acting as a Education administrator for Lavon Paganini, MD.,have documented all relevant documentation on the behalf of Lavon Paganini, MD,as directed by  Lavon Paganini, MD while in the presence of Lavon Paganini, MD.     Established patient visit   Patient: [name] [name]   DOB: 07-22-1956   66 y.o. Female  MRN: [id number] Visit Date: 11/08/2022  Today's healthcare provider: Lavon Paganini, MD   No chief complaint on file.  Subjective    HPI  Upper respiratory symptoms She complains of {uri sx's' brief:15453}.with {systemic_sx:15294}. Onset of symptoms was {onset initial:119223} and {progression:119226}.She {hydration history:15378}.  Past history is significant for {respiratory illness:412}. Patient is {smoker?:15292}  ---------------------------------------------------------------------------------------------------   Medications: Outpatient Medications Prior to Visit  Medication Sig   azithromycin (ZITHROMAX) 250 MG tablet Take 1 tablet (250 mg total) by mouth daily. Take first 2 tablets together, then 1 every day until finished.   calcium-vitamin D (OSCAL WITH D) 500-200 MG-UNIT tablet Take 1 tablet by mouth See admin instructions. Take 2 tablets by mouth daily in the morning and 1 tablet in the evening (Patient not taking: Reported on 02/19/2022)   diltiazem (CARDIZEM) 30 MG tablet Take 1 tablet (30 mg total) by mouth 3 (three) times daily as needed. For fast heart rate   FLUZONE HIGH-DOSE QUADRIVALENT 0.7 ML SUSY    hydrochlorothiazide (HYDRODIURIL) 12.5 MG tablet TAKE ONE TABLET EVERY DAY WITH LOSARTAN '50MG'$    losartan (COZAAR) 50 MG tablet Take 1 tablet (50 mg total) by mouth daily.   Multiple Vitamin (MULTIVITAMIN) capsule Take 1 capsule by mouth daily.    niacin 500 MG tablet Take 500 mg by mouth 2 (two) times daily with a meal.    PFIZER COVID-19 VAC BIVALENT injection    rosuvastatin (CRESTOR) 5 MG tablet Take 1 tablet (5 mg total) by mouth daily.    sertraline (ZOLOFT) 50 MG tablet Take 1 tablet (50 mg total) by mouth every morning.   traZODone (DESYREL) 100 MG tablet Take 1 tablet (100 mg total) by mouth at bedtime as needed for sleep.   No facility-administered medications prior to visit.    Review of Systems  Respiratory:  Positive for cough.   Neurological:  Positive for headaches.    {Labs  Heme  Chem  Endocrine  Serology  Results Review (optional):23779}   Objective    There were no vitals taken for this visit. BP Readings from Last 3 Encounters:  10/13/22 129/83  07/19/22 134/75  01/19/22 112/80   Wt Readings from Last 3 Encounters:  07/19/22 191 lb 9.6 oz (86.9 kg)  01/19/22 187 lb (84.8 kg)  01/18/22 187 lb (84.8 kg)      Physical Exam  ***  No results found for any visits on 11/08/22.  Assessment & Plan     ***  No follow-ups on file.      {provider attestation***:1}   Lavon Paganini, MD  [facility] [phone] (phone) [phone] (fax)  [place]

## 2022-11-08 ENCOUNTER — Encounter: Payer: Self-pay | Admitting: Family Medicine

## 2022-11-08 ENCOUNTER — Ambulatory Visit (INDEPENDENT_AMBULATORY_CARE_PROVIDER_SITE_OTHER): Payer: No Typology Code available for payment source | Admitting: Family Medicine

## 2022-11-08 VITALS — BP 114/80 | HR 74 | Temp 98.0°F | Resp 16 | Wt 185.6 lb

## 2022-11-08 DIAGNOSIS — Z23 Encounter for immunization: Secondary | ICD-10-CM | POA: Diagnosis not present

## 2022-11-08 DIAGNOSIS — R058 Other specified cough: Secondary | ICD-10-CM

## 2022-12-20 ENCOUNTER — Encounter: Payer: Self-pay | Admitting: Family Medicine

## 2022-12-20 MED ORDER — LOSARTAN POTASSIUM 50 MG PO TABS: 50.0000 mg | ORAL_TABLET | Freq: Every day | ORAL | 3 refills | Status: DC

## 2022-12-20 MED ORDER — HYDROCHLOROTHIAZIDE 12.5 MG PO TABS: ORAL_TABLET | ORAL | 3 refills | Status: DC

## 2022-12-20 NOTE — Addendum Note (Signed)
Addended by: Doristine Devoid on: 12/20/2022 11:46 AM   Modules accepted: Orders

## 2023-01-18 NOTE — Progress Notes (Unsigned)
I,Sha'taria Tyson,acting as a Education administrator for Lavon Paganini, MD.,have documented all relevant documentation on the behalf of Lavon Paganini, MD,as directed by  Lavon Paganini, MD while in the presence of Lavon Paganini, MD.   Established patient visit   Patient: [name] [name]   DOB: 04-19-1956   66 y.o. Female  MRN: [id number] Visit Date: 01/21/2023  Today's healthcare provider: Lavon Paganini, MD   Chief Complaint  Patient presents with   Follow-up   Subjective    HPI  -Would like all meds to be sent to different pharmacy due to insurance -Covid Vaccine: Dec 27  -Shingles: will contact pharmacy Prediabetes, Follow-up  Lab Results  Component Value Date   HGBA1C 5.8 (H) 10/08/2022   HGBA1C 6.1 (H) 01/19/2022   HGBA1C 5.7 (A) 07/13/2021   GLUCOSE 106 (H) 10/08/2022   GLUCOSE 105 (H) 01/19/2022   GLUCOSE 102 (H) 05/12/2021    Last seen for for this 6 months ago.  Management since that visit includes no changes. Current symptoms include none and have been unchanged.  Pertinent Labs:    Component Value Date/Time   CHOL 217 (H) 10/08/2022 1312   CHOL 185 11/10/2013 0832   TRIG 135 10/08/2022 1312   TRIG 121 11/10/2013 0832   CHOLHDL 3.9 10/08/2022 1312   CREATININE 1.10 (H) 10/08/2022 1312   CREATININE 0.95 01/11/2015 2150    Wt Readings from Last 3 Encounters:  01/21/23 185 lb 6.4 oz (84.1 kg)  11/08/22 185 lb 9.6 oz (84.2 kg)  07/19/22 191 lb 9.6 oz (86.9 kg)  ----------------------------------------------------------------------------------------- Hypertension, follow-up  BP Readings from Last 3 Encounters:  01/21/23 116/80  11/08/22 114/80  10/13/22 129/83   Wt Readings from Last 3 Encounters:  01/21/23 185 lb 6.4 oz (84.1 kg)  11/08/22 185 lb 9.6 oz (84.2 kg)  07/19/22 191 lb 9.6 oz (86.9 kg)     She was last seen for hypertension 6 months ago.  BP at that visit was 134/75. Management since that visit includes no changes. She  reports excellent compliance with treatment. She is not having side effects.   Outside blood pressures are checked on occasion --------------------------------------------------------------------------------------------------- Lipid/Cholesterol, follow-up  Last Lipid Panel: Lab Results  Component Value Date   CHOL 217 (H) 10/08/2022   LDLCALC 138 (H) 10/08/2022   HDL 55 10/08/2022   TRIG 135 10/08/2022    She was last seen for this 6 months ago.  Management since that visit includes no changes.  She reports excellent compliance with treatment. She is not having side effects.   Last metabolic panel Lab Results  Component Value Date   GLUCOSE 106 (H) 10/08/2022   NA 143 10/08/2022   K 4.3 10/08/2022   BUN 16 10/08/2022   CREATININE 1.10 (H) 10/08/2022   EGFR 55 (L) 10/08/2022   GFRNONAA 57 (L) 11/29/2020   CALCIUM 10.4 (H) 10/08/2022   AST 17 10/08/2022   ALT 12 10/08/2022   The 10-year ASCVD risk score (Arnett DK, et al., 2019) is: 7.9%  ---------------------------------------------------------------------------------------------------   Medications: Outpatient Medications Prior to Visit  Medication Sig   diltiazem (CARDIZEM) 30 MG tablet Take 1 tablet (30 mg total) by mouth 3 (three) times daily as needed. For fast heart rate   Multiple Vitamin (MULTIVITAMIN) capsule Take 1 capsule by mouth daily.    PFIZER COVID-19 VAC BIVALENT injection    [DISCONTINUED] hydrochlorothiazide (HYDRODIURIL) 12.5 MG tablet TAKE ONE TABLET EVERY DAY WITH LOSARTAN '50MG'$    [DISCONTINUED] Ivermectin 1 % CREA    [  DISCONTINUED] ketoconazole (NIZORAL) 2 % shampoo    [DISCONTINUED] losartan (COZAAR) 50 MG tablet Take 1 tablet (50 mg total) by mouth daily.   [DISCONTINUED] niacin 500 MG tablet Take 500 mg by mouth 2 (two) times daily with a meal.    [DISCONTINUED] rosuvastatin (CRESTOR) 5 MG tablet Take 1 tablet (5 mg total) by mouth daily.   [DISCONTINUED] sertraline (ZOLOFT) 50 MG tablet  Take 1 tablet (50 mg total) by mouth every morning.   [DISCONTINUED] traZODone (DESYREL) 100 MG tablet Take 1 tablet (100 mg total) by mouth at bedtime as needed for sleep.   No facility-administered medications prior to visit.    Review of Systems per HPI     Objective    BP 116/80 (BP Location: Left Arm, Patient Position: Sitting, Cuff Size: Normal)   Pulse 73   Ht 5' 4.5" (1.638 m)   Wt 185 lb 6.4 oz (84.1 kg)   BMI 31.33 kg/m    Physical Exam Vitals reviewed.  Constitutional:      General: She is not in acute distress.    Appearance: Normal appearance. She is well-developed. She is not diaphoretic.  HENT:     Head: Normocephalic and atraumatic.  Eyes:     General: No scleral icterus.    Conjunctiva/sclera: Conjunctivae normal.  Neck:     Thyroid: No thyromegaly.  Cardiovascular:     Rate and Rhythm: Normal rate and regular rhythm.     Pulses: Normal pulses.     Heart sounds: Normal heart sounds. No murmur heard. Pulmonary:     Effort: Pulmonary effort is normal. No respiratory distress.     Breath sounds: Normal breath sounds. No wheezing, rhonchi or rales.  Musculoskeletal:     Cervical back: Neck supple.     Right lower leg: No edema.     Left lower leg: No edema.  Lymphadenopathy:     Cervical: No cervical adenopathy.  Skin:    General: Skin is warm and dry.     Findings: No rash.  Neurological:     Mental Status: She is alert and oriented to person, place, and time. Mental status is at baseline.  Psychiatric:        Mood and Affect: Mood normal.        Behavior: Behavior normal.       No results found for any visits on 01/21/23.  Assessment & Plan     Problem List Items Addressed This Visit       Cardiovascular and Mediastinum   Primary hypertension    Well controlled Continue current medications Recheck metabolic panel F/u in 6 months       Relevant Medications   hydrochlorothiazide (HYDRODIURIL) 12.5 MG tablet   losartan (COZAAR) 50  MG tablet   rosuvastatin (CRESTOR) 5 MG tablet   Other Relevant Orders   Comprehensive metabolic panel     Other   Hypertriglyceridemia    Previously uncontrolled - started Crestor Continue statin Repeat FLP and CMP      Relevant Medications   hydrochlorothiazide (HYDRODIURIL) 12.5 MG tablet   losartan (COZAAR) 50 MG tablet   rosuvastatin (CRESTOR) 5 MG tablet   Other Relevant Orders   Comprehensive metabolic panel   Lipid panel   Prediabetes - Primary    Recommend low carb diet Recheck A1c       Relevant Orders   Hemoglobin A1c   Recurrent major depressive disorder, in full remission ([place])    Well controlled Continue sertraline at current  dose Trazodone prn for sleep - sparingly      Relevant Medications   sertraline (ZOLOFT) 50 MG tablet   traZODone (DESYREL) 100 MG tablet     Return in about 6 months (around 07/22/2023) for CPE.      I, Lavon Paganini, MD, have reviewed all documentation for this visit. The documentation on 01/21/23 for the exam, diagnosis, procedures, and orders are all accurate and complete.   Bacigalupo, Dionne Bucy, MD, MPH [facility]

## 2023-01-21 ENCOUNTER — Ambulatory Visit (INDEPENDENT_AMBULATORY_CARE_PROVIDER_SITE_OTHER): Payer: Medicare Other | Admitting: Family Medicine

## 2023-01-21 ENCOUNTER — Encounter: Payer: Self-pay | Admitting: Family Medicine

## 2023-01-21 VITALS — BP 116/80 | HR 73 | Ht 64.5 in | Wt 185.4 lb

## 2023-01-21 DIAGNOSIS — I10 Essential (primary) hypertension: Secondary | ICD-10-CM | POA: Diagnosis not present

## 2023-01-21 DIAGNOSIS — F3342 Major depressive disorder, recurrent, in full remission: Secondary | ICD-10-CM | POA: Diagnosis not present

## 2023-01-21 DIAGNOSIS — E781 Pure hyperglyceridemia: Secondary | ICD-10-CM | POA: Diagnosis not present

## 2023-01-21 DIAGNOSIS — R7303 Prediabetes: Secondary | ICD-10-CM

## 2023-01-21 MED ORDER — TRAZODONE HCL 100 MG PO TABS: 100.0000 mg | ORAL_TABLET | Freq: Every evening | ORAL | 1 refills | Status: DC | PRN

## 2023-01-21 MED ORDER — IVERMECTIN 1 % EX CREA: TOPICAL_CREAM | CUTANEOUS | 1 refills | Status: AC

## 2023-01-21 MED ORDER — ROSUVASTATIN CALCIUM 5 MG PO TABS: 5.0000 mg | ORAL_TABLET | Freq: Every day | ORAL | 3 refills | Status: DC

## 2023-01-21 MED ORDER — SERTRALINE HCL 50 MG PO TABS: 50.0000 mg | ORAL_TABLET | ORAL | 3 refills | Status: DC

## 2023-01-21 MED ORDER — LOSARTAN POTASSIUM 50 MG PO TABS: 50.0000 mg | ORAL_TABLET | Freq: Every day | ORAL | 3 refills | Status: DC

## 2023-01-21 MED ORDER — KETOCONAZOLE 2 % EX SHAM: MEDICATED_SHAMPOO | CUTANEOUS | 3 refills | Status: AC

## 2023-01-21 MED ORDER — HYDROCHLOROTHIAZIDE 12.5 MG PO TABS: ORAL_TABLET | ORAL | 3 refills | Status: DC

## 2023-01-21 NOTE — Assessment & Plan Note (Signed)
Well controlled Continue sertraline at current dose Trazodone prn for sleep - sparingly

## 2023-01-21 NOTE — Assessment & Plan Note (Signed)
Recommend low carb diet °Recheck A1c  °

## 2023-01-21 NOTE — Assessment & Plan Note (Signed)
Previously uncontrolled - started Crestor Continue statin Repeat FLP and CMP

## 2023-01-21 NOTE — Assessment & Plan Note (Signed)
Well controlled Continue current medications Recheck metabolic panel F/u in 6 months  

## 2023-01-22 LAB — HEMOGLOBIN A1C
Est. average glucose Bld gHb Est-mCnc: 120 mg/dL
Hgb A1c MFr Bld: 5.8 % — ABNORMAL HIGH (ref 4.8–5.6)

## 2023-01-22 LAB — COMPREHENSIVE METABOLIC PANEL
ALT: 12 IU/L (ref 0–32)
AST: 19 IU/L (ref 0–40)
Albumin/Globulin Ratio: 1.5 (ref 1.2–2.2)
Albumin: 4.3 g/dL (ref 3.9–4.9)
Alkaline Phosphatase: 91 IU/L (ref 44–121)
BUN/Creatinine Ratio: 16 (ref 12–28)
BUN: 16 mg/dL (ref 8–27)
Bilirubin Total: 0.3 mg/dL (ref 0.0–1.2)
CO2: 24 mmol/L (ref 20–29)
Calcium: 10.1 mg/dL (ref 8.7–10.3)
Chloride: 103 mmol/L (ref 96–106)
Creatinine, Ser: 1.03 mg/dL — ABNORMAL HIGH (ref 0.57–1.00)
Globulin, Total: 2.9 g/dL (ref 1.5–4.5)
Glucose: 92 mg/dL (ref 70–99)
Potassium: 4.2 mmol/L (ref 3.5–5.2)
Sodium: 141 mmol/L (ref 134–144)
Total Protein: 7.2 g/dL (ref 6.0–8.5)
eGFR: 60 mL/min/{1.73_m2} (ref >59)

## 2023-01-22 LAB — LIPID PANEL
Chol/HDL Ratio: 3.4 ratio (ref 0.0–4.4)
Cholesterol, Total: 185 mg/dL (ref 100–199)
HDL: 55 mg/dL (ref >39)
LDL Chol Calc (NIH): 102 mg/dL — ABNORMAL HIGH (ref 0–99)
Triglycerides: 159 mg/dL — ABNORMAL HIGH (ref 0–149)
VLDL Cholesterol Cal: 28 mg/dL (ref 5–40)

## 2023-01-27 ENCOUNTER — Encounter: Payer: Self-pay | Admitting: Family Medicine

## 2023-02-20 ENCOUNTER — Ambulatory Visit (INDEPENDENT_AMBULATORY_CARE_PROVIDER_SITE_OTHER): Payer: Medicare Other

## 2023-02-20 VITALS — Ht 65.0 in | Wt 185.0 lb

## 2023-02-20 DIAGNOSIS — Z1231 Encounter for screening mammogram for malignant neoplasm of breast: Secondary | ICD-10-CM

## 2023-02-20 DIAGNOSIS — Z0000 Encounter for general adult medical examination without abnormal findings: Secondary | ICD-10-CM

## 2023-02-20 NOTE — Patient Instructions (Signed)
[name] [name] , Thank you for taking time to come for your Medicare Wellness Visit. I appreciate your ongoing commitment to your health goals. Please review the following plan we discussed and let me know if I can assist you in the future.   These are the goals we discussed:  Goals      DIET - EAT MORE FRUITS AND VEGETABLES        This is a list of the screening recommended for you and due dates:  Health Maintenance  Topic Date Due   Zoster (Shingles) Vaccine (1 of 2) Never done   COVID-19 Vaccine (7 - 2023-24 season) 08/24/2022   Medicare Annual Wellness Visit  02/21/2024   Mammogram  03/12/2024   DEXA scan (bone density measurement)  02/23/2025   DTaP/Tdap/Td vaccine (3 - Td or Tdap) 09/30/2028   Colon Cancer Screening  05/06/2030   Pneumonia Vaccine  Completed   Flu Shot  Completed   Hepatitis C Screening: USPSTF Recommendation to screen - Ages 18-79 yo.  Completed   HPV Vaccine  Aged Out    Advanced directives: yes  Conditions/risks identified: none  Next appointment: Follow up in one year for your annual wellness visit 02/24/2024 @ 3pm telephone   Preventive Care 65 Years and Older, Female Preventive care refers to lifestyle choices and visits with your health care provider that can promote health and wellness. What does preventive care include? A yearly physical exam. This is also called an annual well check. Dental exams once or twice a year. Routine eye exams. Ask your health care provider how often you should have your eyes checked. Personal lifestyle choices, including: Daily care of your teeth and gums. Regular physical activity. Eating a healthy diet. Avoiding tobacco and drug use. Limiting alcohol use. Practicing safe sex. Taking low-dose aspirin every day. Taking vitamin and mineral supplements as recommended by your health care provider. What happens during an annual well check? The services and screenings done by your health care provider during your  annual well check will depend on your age, overall health, lifestyle risk factors, and family history of disease. Counseling  Your health care provider may ask you questions about your: Alcohol use. Tobacco use. Drug use. Emotional well-being. Home and relationship well-being. Sexual activity. Eating habits. History of falls. Memory and ability to understand (cognition). Work and work Statistician. Reproductive health. Screening  You may have the following tests or measurements: Height, weight, and BMI. Blood pressure. Lipid and cholesterol levels. These may be checked every 5 years, or more frequently if you are over 37 years old. Skin check. Lung cancer screening. You may have this screening every year starting at age 44 if you have a 30-pack-year history of smoking and currently smoke or have quit within the past 15 years. Fecal occult blood test (FOBT) of the stool. You may have this test every year starting at age 42. Flexible sigmoidoscopy or colonoscopy. You may have a sigmoidoscopy every 5 years or a colonoscopy every 10 years starting at age 27. Hepatitis C blood test. Hepatitis B blood test. Sexually transmitted disease (STD) testing. Diabetes screening. This is done by checking your blood sugar (glucose) after you have not eaten for a while (fasting). You may have this done every 1-3 years. Bone density scan. This is done to screen for osteoporosis. You may have this done starting at age 49. Mammogram. This may be done every 1-2 years. Talk to your health care provider about how often you should have regular  mammograms. Talk with your health care provider about your test results, treatment options, and if necessary, the need for more tests. Vaccines  Your health care provider may recommend certain vaccines, such as: Influenza vaccine. This is recommended every year. Tetanus, diphtheria, and acellular pertussis (Tdap, Td) vaccine. You may need a Td booster every 10  years. Zoster vaccine. You may need this after age 18. Pneumococcal 13-valent conjugate (PCV13) vaccine. One dose is recommended after age 48. Pneumococcal polysaccharide (PPSV23) vaccine. One dose is recommended after age 52. Talk to your health care provider about which screenings and vaccines you need and how often you need them. This information is not intended to replace advice given to you by your health care provider. Make sure you discuss any questions you have with your health care provider. Document Released: 01/06/2016 Document Revised: 08/29/2016 Document Reviewed: 10/11/2015 Elsevier Interactive Patient Education  2017 [place] Prevention in the Home Falls can cause injuries. They can happen to people of all ages. There are many things you can do to make your home safe and to help prevent falls. What can I do on the outside of my home? Regularly fix the edges of walkways and driveways and fix any cracks. Remove anything that might make you trip as you walk through a door, such as a raised step or threshold. Trim any bushes or trees on the path to your home. Use bright outdoor lighting. Clear any walking paths of anything that might make someone trip, such as rocks or tools. Regularly check to see if handrails are loose or broken. Make sure that both sides of any steps have handrails. Any raised decks and porches should have guardrails on the edges. Have any leaves, snow, or ice cleared regularly. Use sand or salt on walking paths during winter. Clean up any spills in your garage right away. This includes oil or grease spills. What can I do in the bathroom? Use night lights. Install grab bars by the toilet and in the tub and shower. Do not use towel bars as grab bars. Use non-skid mats or decals in the tub or shower. If you need to sit down in the shower, use a plastic, non-slip stool. Keep the floor dry. Clean up any water that spills on the floor as soon as it  happens. Remove soap buildup in the tub or shower regularly. Attach bath mats securely with double-sided non-slip rug tape. Do not have throw rugs and other things on the floor that can make you trip. What can I do in the bedroom? Use night lights. Make sure that you have a light by your bed that is easy to reach. Do not use any sheets or blankets that are too big for your bed. They should not hang down onto the floor. Have a firm chair that has side arms. You can use this for support while you get dressed. Do not have throw rugs and other things on the floor that can make you trip. What can I do in the kitchen? Clean up any spills right away. Avoid walking on wet floors. Keep items that you use a lot in easy-to-reach places. If you need to reach something above you, use a strong step stool that has a grab bar. Keep electrical cords out of the way. Do not use floor polish or wax that makes floors slippery. If you must use wax, use non-skid floor wax. Do not have throw rugs and other things on the floor that can  make you trip. What can I do with my stairs? Do not leave any items on the stairs. Make sure that there are handrails on both sides of the stairs and use them. Fix handrails that are broken or loose. Make sure that handrails are as long as the stairways. Check any carpeting to make sure that it is firmly attached to the stairs. Fix any carpet that is loose or worn. Avoid having throw rugs at the top or bottom of the stairs. If you do have throw rugs, attach them to the floor with carpet tape. Make sure that you have a light switch at the top of the stairs and the bottom of the stairs. If you do not have them, ask someone to add them for you. What else can I do to help prevent falls? Wear shoes that: Do not have high heels. Have rubber bottoms. Are comfortable and fit you well. Are closed at the toe. Do not wear sandals. If you use a stepladder: Make sure that it is fully opened.  Do not climb a closed stepladder. Make sure that both sides of the stepladder are locked into place. Ask someone to hold it for you, if possible. Clearly mark and make sure that you can see: Any grab bars or handrails. First and last steps. Where the edge of each step is. Use tools that help you move around (mobility aids) if they are needed. These include: Canes. Walkers. Scooters. Crutches. Turn on the lights when you go into a dark area. Replace any light bulbs as soon as they burn out. Set up your furniture so you have a clear path. Avoid moving your furniture around. If any of your floors are uneven, fix them. If there are any pets around you, be aware of where they are. Review your medicines with your doctor. Some medicines can make you feel dizzy. This can increase your chance of falling. Ask your doctor what other things that you can do to help prevent falls. This information is not intended to replace advice given to you by your health care provider. Make sure you discuss any questions you have with your health care provider. Document Released: 10/06/2009 Document Revised: 05/17/2016 Document Reviewed: 01/14/2015 Elsevier Interactive Patient Education  2017 [employer].

## 2023-02-20 NOTE — Progress Notes (Signed)
I connected with  [name] [name] on 02/20/23 by a audio enabled telemedicine application and verified that I am speaking with the correct person using two identifiers.  Patient Location: Home  Provider Location: Office/Clinic  I discussed the limitations of evaluation and management by telemedicine. The patient expressed understanding and agreed to proceed.  Subjective:   [name] [name] is a 67 y.o. female who presents for Medicare Annual (Subsequent) preventive examination.  Review of Systems    Cardiac Risk Factors include: advanced age (>57mn, >>21women);dyslipidemia;hypertension;obesity (BMI >30kg/m2)     Objective:    Today's Vitals   02/20/23 1432  Weight: 185 lb (83.9 kg)  Height: '5\' 5"'$  (1.651 m)   Body mass index is 30.79 kg/m.     02/20/2023    2:42 PM 10/13/2022    1:54 PM 02/19/2022    3:50 PM 02/13/2021    2:39 PM 05/05/2020   10:38 AM 09/05/2018    8:21 AM 09/03/2018   11:06 AM  Advanced Directives  Does Patient Have a Medical Advance Directive? Yes No No No Yes Yes Yes  Type of Advance Directive     Living will;Healthcare Power of Attorney Living will;Healthcare Power of Attorney   Does patient want to make changes to medical advance directive?      No - Patient declined   Copy of [place]?      No - copy requested   Would patient like information on creating a medical advance directive?   No - Patient declined No - Patient declined  No - Patient declined     Current Medications (verified) Outpatient Encounter Medications as of 02/20/2023  Medication Sig   diltiazem (CARDIZEM) 30 MG tablet Take 1 tablet (30 mg total) by mouth 3 (three) times daily as needed. For fast heart rate   hydrochlorothiazide (HYDRODIURIL) 12.5 MG tablet TAKE ONE TABLET EVERY DAY WITH LOSARTAN '50MG'$    Ivermectin 1 % CREA Apply topically to affected area daily   ketoconazole (NIZORAL) 2 % shampoo Apply topically 3 (three) times a week.   losartan  (COZAAR) 50 MG tablet Take 1 tablet (50 mg total) by mouth daily.   Multiple Vitamin (MULTIVITAMIN) capsule Take 1 capsule by mouth daily.    rosuvastatin (CRESTOR) 5 MG tablet Take 1 tablet (5 mg total) by mouth daily.   sertraline (ZOLOFT) 50 MG tablet Take 1 tablet (50 mg total) by mouth every morning.   traZODone (DESYREL) 100 MG tablet Take 1 tablet (100 mg total) by mouth at bedtime as needed for sleep.   PFIZER COVID-19 VAC BIVALENT injection    No facility-administered encounter medications on file as of 02/20/2023.    Allergies (verified) Oxycodone-acetaminophen   History: Past Medical History:  Diagnosis Date   Anxiety    not an issue anymore. this was due to broken ankle   Arthritis    BACK   Complication of anesthesia    SLOW TO WAKE UP X 1 WITH ANKLE SURGERY   GERD (gastroesophageal reflux disease)    OCC-NO MEDS   History of kidney stones    Hypertension 02/19/2017   Hypertriglyceridemia 06/25/2016   Osteopenia 06/25/2016   PONV (postoperative nausea and vomiting)    Past Surgical History:  Procedure Laterality Date   ANKLE FRACTURE SURGERY Left 2012   CHOLECYSTECTOMY     COLONOSCOPY WITH PROPOFOL N/A 05/05/2020   Procedure: COLONOSCOPY WITH PROPOFOL;  Surgeon: Toledo, TBenay Pike MD;  Location: [facility];  Service: Gastroenterology;  Laterality: N/A;   CYSTOSCOPY/URETEROSCOPY/HOLMIUM LASER/STENT PLACEMENT Left 09/05/2018   Procedure: CYSTOSCOPY/URETEROSCOPY/HOLMIUM LASER/STENT PLACEMENT;  Surgeon: Billey Co, MD;  Location: [facility] ORS;  Service: Urology;  Laterality: Left;   EXTRACORPOREAL SHOCK WAVE LITHOTRIPSY Left 08/21/2018   Procedure: EXTRACORPOREAL SHOCK WAVE LITHOTRIPSY (ESWL);  Surgeon: Abbie Sons, MD;  Location: [facility] ORS;  Service: Urology;  Laterality: Left;   FOOT SURGERY Right    neuroma removed   LITHOTRIPSY  1990'S   TUBAL LIGATION     VEIN LIGATION AND STRIPPING Left    Family History  Problem Relation Age of Onset   Breast cancer  Maternal Aunt 59   Kidney disease Mother    Alzheimer's disease Mother    Cancer Father    Social History   Socioeconomic History   Marital status: Married    Spouse name: Clair Gulling   Number of children: 6   Years of education: Not on file   Highest education level: Not on file  Occupational History    Comment: retired  Tobacco Use   Smoking status: Never   Smokeless tobacco: Never  Vaping Use   Vaping Use: Never used  Substance and Sexual Activity   Alcohol use: No   Drug use: No   Sexual activity: Not on file  Other Topics Concern   Not on file  Social History Narrative   Not on file   Social Determinants of Health   Financial Resource Strain: Low Risk  (02/20/2023)   Overall Financial Resource Strain (CARDIA)    Difficulty of Paying Living Expenses: Not hard at all  Food Insecurity: No Food Insecurity (02/20/2023)   Hunger Vital Sign    Worried About Running Out of Food in the Last Year: Never true    [place] in the Last Year: Never true  Transportation Needs: No Transportation Needs (02/20/2023)   PRAPARE - Hydrologist (Medical): No    Lack of Transportation (Non-Medical): No  Physical Activity: Insufficiently Active (02/20/2023)   Exercise Vital Sign    Days of Exercise per Week: 3 days    Minutes of Exercise per Session: 30 min  Stress: No Stress Concern Present (02/20/2023)   [place]    Feeling of Stress : Not at all  Social Connections: Moderately Integrated (02/20/2023)   Social Connection and Isolation Panel [NHANES]    Frequency of Communication with Friends and Family: More than three times a week    Frequency of Social Gatherings with Friends and Family: More than three times a week    Attends Religious Services: More than 4 times per year    Active Member of [employer] or Organizations: No    Attends Music therapist: Never    Marital Status:  Married    Tobacco Counseling Counseling given: Not Answered   Clinical Intake:  Pre-visit preparation completed: Yes  Pain : No/denies pain     BMI - recorded: 30.79 Nutritional Status: BMI > 30  Obese Nutritional Risks: None Diabetes: No  How often do you need to have someone help you when you read instructions, pamphlets, or other written materials from your doctor or pharmacy?: 1 - Never  Diabetic?no  Interpreter Needed?: No  Information entered by :: B.Kyah Buesing,LPN   Activities of Daily Living    02/20/2023    2:43 PM 11/08/2022    8:19 AM  In your present state of health, do you have any difficulty  performing the following activities:  Hearing? 0 0  Vision? 0 0  Difficulty concentrating or making decisions? 0 0  Walking or climbing stairs? 0 0  Dressing or bathing? 0 0  Doing errands, shopping? 0 0  Preparing Food and eating ? N   Using the Toilet? N   In the past six months, have you accidently leaked urine? N   Do you have problems with loss of bowel control? N   Managing your Medications? N   Managing your Finances? N   Housekeeping or managing your Housekeeping? N     Patient Care Team: Virginia Crews, MD as PCP - General (Family Medicine)  Indicate any recent Medical Services you may have received from other than [facility] providers in the past year (date may be approximate).     Assessment:   This is a routine wellness examination for [place].  Hearing/Vision screen Hearing Screening - Comments:: Adequate hearing Vision Screening - Comments:: Adequate vision w/glasses Dr Gloriann Loan  Dietary issues and exercise activities discussed: Current Exercise Habits: Structured exercise class, Type of exercise: walking, Time (Minutes): 30, Frequency (Times/Week): 3, Weekly Exercise (Minutes/Week): 90, Intensity: Mild, Exercise limited by: None identified   Goals Addressed             This Visit's Progress    DIET - EAT MORE FRUITS AND VEGETABLES    On track      Depression Screen    02/20/2023    2:39 PM 11/08/2022    8:19 AM 07/19/2022    8:54 AM 02/19/2022    3:48 PM 07/13/2021    5:00 PM 05/12/2021    8:55 AM 11/29/2020    2:25 PM  PHQ 2/9 Scores  PHQ - 2 Score 0 0 0 0 0 0 1  PHQ- 9 Score  '2   2 3 3    '$ Fall Risk    02/20/2023    2:35 PM 11/08/2022    8:07 AM 07/19/2022    8:53 AM 02/19/2022    3:51 PM 07/13/2021    5:00 PM  [place] in the past year? 0 0 0 0 1  Number falls in past yr: 0 0 0 0 0  Injury with Fall? 0 0 0 0 0  Risk for fall due to : No Fall Risks No Fall Risks  No Fall Risks No Fall Risks  Follow up Education provided;Falls prevention discussed Falls evaluation completed  Falls evaluation completed     FALL RISK PREVENTION PERTAINING TO THE HOME:  Any stairs in or around the home? Yes  If so, are there any without handrails? Yes  Home free of loose throw rugs in walkways, pet beds, electrical cords, etc? Yes  Adequate lighting in your home to reduce risk of falls? Yes   ASSISTIVE DEVICES UTILIZED TO PREVENT FALLS:  Life alert? No  Use of a cane, walker or w/c? No  Grab bars in the bathroom? No in one BR Shower chair or bench in shower? Yes  Elevated toilet seat or a handicapped toilet? Yes   Cognitive Function:        02/20/2023    2:49 PM  6CIT Screen  What Year? 0 points  What month? 0 points  What time? 0 points  Count back from 20 0 points  Months in reverse 0 points  Repeat phrase 0 points  Total Score 0 points    Immunizations Immunization History  Administered Date(s) Administered   Fluad Quad(high Dose  65+) 11/08/2022   Influenza,inj,Quad PF,6+ Mos 11/05/2019   Influenza-Unspecified 09/27/2018, 11/24/2020, 10/11/2021   PFIZER Comirnaty(Gray Top)Covid-19 Tri-Sucrose Vaccine 10/20/2020   PFIZER(Purple Top)SARS-COV-2 Vaccination 02/26/2020, 03/18/2020, 04/27/2020, 05/18/2020   PNEUMOCOCCAL CONJUGATE-20 01/18/2022   Pfizer Covid-19 Vaccine Bivalent Booster 48yr &  up 11/14/2021   Pneumococcal Conjugate-13 08/24/2020   Td 09/30/2018   Tdap 09/30/2018   Zoster, Live 06/18/2011    TDAP status: Up to date  Flu Vaccine status: Up to date  Pneumococcal vaccine status: Up to date  Covid-19 vaccine status: Completed vaccines  Qualifies for Shingles Vaccine? Yes   Zostavax completed No   Shingrix Completed?: No.    Education has been provided regarding the importance of this vaccine. Patient has been advised to call insurance company to determine out of pocket expense if they have not yet received this vaccine. Advised may also receive vaccine at local pharmacy or Health Dept. Verbalized acceptance and understanding.  Screening Tests Health Maintenance  Topic Date Due   Zoster Vaccines- Shingrix (1 of 2) Never done   COVID-19 Vaccine (7 - 2023-24 season) 08/24/2022   Medicare Annual Wellness (AWV)  02/21/2024   MAMMOGRAM  03/12/2024   DEXA SCAN  02/23/2025   DTaP/Tdap/Td (3 - Td or Tdap) 09/30/2028   COLONOSCOPY (Pts 45-464yrInsurance coverage will need to be confirmed)  05/06/2030   Pneumonia Vaccine 6522Years old  Completed   INFLUENZA VACCINE  Completed   Hepatitis C Screening  Completed   HPV VACCINES  Aged Out    Health Maintenance  Health Maintenance Due  Topic Date Due   Zoster Vaccines- Shingrix (1 of 2) Never done   COVID-19 Vaccine (7 - 2023-24 season) 08/24/2022    Colorectal cancer screening: Type of screening: Colonoscopy. Completed yes. Repeat every 10 years  Mammogram status: Ordered yes. Pt provided with contact info and advised to call to schedule appt.   Bone Density status: Completed yes. Results reflect: Bone density results: NORMAL. Repeat every 5 years. Pt says osteopenia last image  Lung Cancer Screening: (Low Dose CT Chest recommended if Age 68-80ears, 30 pack-year currently smoking OR have quit w/in 15years.) does not qualify.   Lung Cancer Screening Referral: no  Additional Screening:  Hepatitis C  Screening: does not qualify; Completed yes  Vision Screening: Recommended annual ophthalmology exams for early detection of glaucoma and other disorders of the eye. Is the patient up to date with their annual eye exam?  Yes  Who is the provider or what is the name of the office in which the patient attends annual eye exams? Dr BeGloriann Loanf pt is not established with a provider, would they like to be referred to a provider to establish care? No .   Dental Screening: Recommended annual dental exams for proper oral hygiene  Community Resource Referral / Chronic Care Management: CRR required this visit?  No   CCM required this visit?  No      Plan:     I have personally reviewed and noted the following in the patient's chart:   Medical and social history Use of alcohol, tobacco or illicit drugs  Current medications and supplements including opioid prescriptions. Patient is not currently taking opioid prescriptions. Functional ability and status Nutritional status Physical activity Advanced directives List of other physicians Hospitalizations, surgeries, and ER visits in previous 12 months Vitals Screenings to include cognitive, depression, and falls Referrals and appointments  In addition, I have reviewed and discussed with patient certain preventive protocols, quality metrics,  and best practice recommendations. A written personalized care plan for preventive services as well as general preventive health recommendations were provided to patient.     Roger Shelter, LPN   [id number]   Nurse Notes: pt is doing well:continues to take care of her grand daughter who has special needs. Pt has no concerns or questions at this time.  MMG order/referral placed (due after 03/13/23)

## 2023-03-25 ENCOUNTER — Encounter: Payer: Self-pay | Admitting: Family Medicine

## 2023-03-25 DIAGNOSIS — Z1231 Encounter for screening mammogram for malignant neoplasm of breast: Secondary | ICD-10-CM

## 2023-05-03 ENCOUNTER — Ambulatory Visit
Admission: RE | Admit: 2023-05-03 | Discharge: 2023-05-03 | Disposition: A | Discharge status: Home / Self Care | Payer: Medicare Other | Source: Ambulatory Visit | Attending: Family Medicine | Admitting: Family Medicine

## 2023-05-03 DIAGNOSIS — Z1231 Encounter for screening mammogram for malignant neoplasm of breast: Secondary | ICD-10-CM | POA: Insufficient documentation

## 2023-05-06 ENCOUNTER — Other Ambulatory Visit: Payer: Self-pay | Admitting: Family Medicine

## 2023-05-06 DIAGNOSIS — R928 Other abnormal and inconclusive findings on diagnostic imaging of breast: Secondary | ICD-10-CM

## 2023-05-06 DIAGNOSIS — N63 Unspecified lump in unspecified breast: Secondary | ICD-10-CM

## 2023-05-09 ENCOUNTER — Ambulatory Visit
Admission: RE | Admit: 2023-05-09 | Discharge: 2023-05-09 | Disposition: A | Discharge status: Home / Self Care | Payer: Medicare Other | Source: Ambulatory Visit | Attending: Family Medicine | Admitting: Family Medicine

## 2023-05-09 DIAGNOSIS — N63 Unspecified lump in unspecified breast: Secondary | ICD-10-CM

## 2023-05-09 DIAGNOSIS — R928 Other abnormal and inconclusive findings on diagnostic imaging of breast: Secondary | ICD-10-CM

## 2023-05-10 ENCOUNTER — Other Ambulatory Visit: Payer: Medicare Other

## 2023-07-22 ENCOUNTER — Encounter: Payer: Medicare Other | Admitting: Family Medicine

## 2023-08-01 ENCOUNTER — Encounter: Payer: Medicare Other | Admitting: Family Medicine

## 2023-08-05 ENCOUNTER — Encounter: Payer: Self-pay | Admitting: Family Medicine

## 2023-08-05 ENCOUNTER — Ambulatory Visit (INDEPENDENT_AMBULATORY_CARE_PROVIDER_SITE_OTHER): Payer: Medicare Other | Admitting: Family Medicine

## 2023-08-05 VITALS — BP 118/72 | HR 72 | Temp 98.2°F | Resp 16 | Ht 65.0 in | Wt 183.3 lb

## 2023-08-05 DIAGNOSIS — M546 Pain in thoracic spine: Secondary | ICD-10-CM

## 2023-08-05 DIAGNOSIS — Z0000 Encounter for general adult medical examination without abnormal findings: Secondary | ICD-10-CM

## 2023-08-05 DIAGNOSIS — E781 Pure hyperglyceridemia: Secondary | ICD-10-CM

## 2023-08-05 DIAGNOSIS — R7303 Prediabetes: Secondary | ICD-10-CM | POA: Diagnosis not present

## 2023-08-05 DIAGNOSIS — G8929 Other chronic pain: Secondary | ICD-10-CM

## 2023-08-05 DIAGNOSIS — I10 Essential (primary) hypertension: Secondary | ICD-10-CM

## 2023-08-05 DIAGNOSIS — F3342 Major depressive disorder, recurrent, in full remission: Secondary | ICD-10-CM

## 2023-08-05 MED ORDER — TRAZODONE HCL 100 MG PO TABS: 100.0000 mg | ORAL_TABLET | Freq: Every evening | ORAL | 1 refills | Status: DC | PRN

## 2023-08-05 NOTE — Progress Notes (Signed)
Complete physical exam  Patient: [name] [name]   DOB: 1956-03-09   67 y.o. Female  MRN: [id number]  Subjective:    Chief Complaint  Patient presents with   Annual Exam    [name] [name] is a 67 y.o. female who presents today for a complete physical exam. She reports consuming a general diet.  She generally feels well. She reports sleeping well. She does have additional problems to discuss today.   Discussed the use of AI scribe software for clinical note transcription with the patient, who gave verbal consent to proceed.  History of Present Illness   The patient presents with chronic mid and low back pain, described as a constant ache. The pain is exacerbated by lifting and transferring their 42-pound granddaughter, which they do frequently due to their caregiving responsibilities. The patient notes that the pain improved during a recent 8-day trip to [place], suggesting that rest and reduced physical strain may alleviate the symptoms. However, due to their family circumstances, prolonged rest is not a feasible solution. The patient is considering using a stroller or wagon to help move their granddaughter around the house to reduce the physical strain.  The patient also mentions a history of irritable bowel syndrome (IBS), which sometimes controls their life. They have to plan their meals and activities around their IBS symptoms, which can be unpredictable and do not seem to be related to specific foods.        Most recent fall risk assessment:    08/05/2023    2:27 PM  Fall Risk   Falls in the past year? 1  Number falls in past yr: 0  Injury with Fall? 0  Risk for fall due to : History of fall(s)  Follow up Falls evaluation completed     Most recent depression screenings:    08/05/2023    2:27 PM 02/20/2023    2:39 PM  PHQ 2/9 Scores  PHQ - 2 Score 0 0  PHQ- 9 Score 1         Patient Care Team: Erasmo Downer, MD as PCP - General (Family Medicine)    Outpatient Medications Prior to Visit  Medication Sig   diltiazem (CARDIZEM) 30 MG tablet Take 1 tablet (30 mg total) by mouth 3 (three) times daily as needed. For fast heart rate   hydrochlorothiazide (HYDRODIURIL) 12.5 MG tablet TAKE ONE TABLET EVERY DAY WITH LOSARTAN 50MG    Ivermectin 1 % CREA Apply topically to affected area daily   ketoconazole (NIZORAL) 2 % shampoo Apply topically 3 (three) times a week.   losartan (COZAAR) 50 MG tablet Take 1 tablet (50 mg total) by mouth daily.   Multiple Vitamin (MULTIVITAMIN) capsule Take 1 capsule by mouth daily.    PFIZER COVID-19 VAC BIVALENT injection    rosuvastatin (CRESTOR) 5 MG tablet Take 1 tablet (5 mg total) by mouth daily.   sertraline (ZOLOFT) 50 MG tablet Take 1 tablet (50 mg total) by mouth every morning.   [DISCONTINUED] traZODone (DESYREL) 100 MG tablet Take 1 tablet (100 mg total) by mouth at bedtime as needed for sleep.   No facility-administered medications prior to visit.    ROS per HPI     Objective:     BP 118/72 (BP Location: Right Arm, Patient Position: Sitting, Cuff Size: Large)   Pulse 72   Temp 98.2 F (36.8 C) (Temporal)   Resp 16   Ht 5\' 5"  (1.651 m)   Wt 183 lb 4.8 oz (83.1  kg)   SpO2 98%   BMI 30.50 kg/m    Physical Exam Vitals reviewed.  Constitutional:      General: She is not in acute distress.    Appearance: Normal appearance. She is well-developed. She is not diaphoretic.  HENT:     Head: Normocephalic and atraumatic.     Right Ear: Tympanic membrane, ear canal and external ear normal.     Left Ear: Tympanic membrane, ear canal and external ear normal.     Nose: Nose normal.     Mouth/Throat:     Mouth: Mucous membranes are moist.     Pharynx: Oropharynx is clear. No oropharyngeal exudate.  Eyes:     General: No scleral icterus.    Conjunctiva/sclera: Conjunctivae normal.     Pupils: Pupils are equal, round, and reactive to light.  Neck:     Thyroid: No thyromegaly.   Cardiovascular:     Rate and Rhythm: Normal rate and regular rhythm.     Heart sounds: Normal heart sounds. No murmur heard. Pulmonary:     Effort: Pulmonary effort is normal. No respiratory distress.     Breath sounds: Normal breath sounds. No wheezing or rales.  Abdominal:     General: There is no distension.     Palpations: Abdomen is soft.     Tenderness: There is no abdominal tenderness.  Musculoskeletal:        General: No deformity.     Cervical back: Neck supple.     Right lower leg: No edema.     Left lower leg: No edema.  Lymphadenopathy:     Cervical: No cervical adenopathy.  Skin:    General: Skin is warm and dry.     Findings: No rash.  Neurological:     Mental Status: She is alert and oriented to person, place, and time. Mental status is at baseline.     Gait: Gait normal.  Psychiatric:        Mood and Affect: Mood normal.        Behavior: Behavior normal.        Thought Content: Thought content normal.      No results found for any visits on 08/05/23.     Assessment & Plan:    Routine Health Maintenance and Physical Exam  Immunization History  Administered Date(s) Administered   Fluad Quad(high Dose 65+) 11/08/2022   Influenza,inj,Quad PF,6+ Mos 11/05/2019   Influenza-Unspecified 09/27/2018, 11/24/2020, 10/11/2021   PFIZER Comirnaty(Gray Top)Covid-19 Tri-Sucrose Vaccine 10/20/2020   PFIZER(Purple Top)SARS-COV-2 Vaccination 02/26/2020, 03/18/2020, 04/27/2020, 05/18/2020   PNEUMOCOCCAL CONJUGATE-20 01/18/2022   Pfizer Covid-19 Vaccine Bivalent Booster 32yrs & up 11/14/2021   Pneumococcal Conjugate-13 08/24/2020   Td 09/30/2018   Tdap 09/30/2018   Zoster, Live 06/18/2011    Health Maintenance  Topic Date Due   Zoster Vaccines- Shingrix (1 of 2) 08/22/2005   COVID-19 Vaccine (7 - 2023-24 season) 08/24/2022   INFLUENZA VACCINE  03/23/2024 (Originally 07/25/2023)   Medicare Annual Wellness (AWV)  02/21/2024   DEXA SCAN  02/23/2025   MAMMOGRAM   05/02/2025   DTaP/Tdap/Td (3 - Td or Tdap) 09/30/2028   Colonoscopy  05/06/2030   Pneumonia Vaccine 43+ Years old  Completed   Hepatitis C Screening  Completed   HPV VACCINES  Aged Out    Discussed health benefits of physical activity, and encouraged her to engage in regular exercise appropriate for her age and condition.  Problem List Items Addressed This Visit  Cardiovascular and Mediastinum   Primary hypertension    Well controlled on current regimen of Hydrochlorothiazide 12.5mg  daily and Losartan 50mg  daily. -Continue current medication regimen.      Relevant Orders   Comprehensive metabolic panel     Other   Hypertriglyceridemia    Managed on Crestor 5mg  daily. -Check cholesterol levels today. -Continue Crestor 5mg  daily.      Relevant Orders   Lipid Panel With LDL/HDL Ratio   Prediabetes    Recommend low carb diet Recheck A1c       Relevant Orders   Hemoglobin A1c   Recurrent major depressive disorder, in full remission (HCC)    Well controlled Continue zoloft at current dose      Relevant Medications   traZODone (DESYREL) 100 MG tablet   Other Visit Diagnoses     Annual physical exam    -  Primary   Relevant Orders   Comprehensive metabolic panel   Lipid Panel With LDL/HDL Ratio   Hemoglobin A1c   Chronic bilateral thoracic back pain       Relevant Medications   traZODone (DESYREL) 100 MG tablet          Chronic Back Pain Chronic mid and low back pain exacerbated by lifting and transferring a 42-pound child. Pain improved with rest and avoidance of lifting. -Encouraged to find creative solutions to reduce lifting and transferring, such as using a stroller or wagon.  PSVT managed with Diltiazem as needed. -Continue Diltiazem as needed.  Irritable Bowel Syndrome (self-reported) Reports symptoms consistent with IBS, including the need to plan activities around bowel movements. -No changes to management discussed.  General Health  Maintenance -Continue current medications, including Zoloft and Trazodone. -Refill Trazodone prescription. -Plan for flu and COVID booster vaccinations in the coming month. -Next colonoscopy due in 2031. -Next mammogram due in May. -Consider shingles vaccination. -Check kidney and liver function, and blood sugar today. -Follow-up appointment in six months.        Return in about 6 months (around 02/05/2024) for chronic disease f/u.     Shirlee Latch, MD

## 2023-08-05 NOTE — Assessment & Plan Note (Signed)
Well controlled Continue zoloft at current dose

## 2023-08-05 NOTE — Assessment & Plan Note (Signed)
Well controlled on current regimen of Hydrochlorothiazide 12.5mg  daily and Losartan 50mg  daily. -Continue current medication regimen.

## 2023-08-05 NOTE — Assessment & Plan Note (Signed)
Recommend low carb diet °Recheck A1c  °

## 2023-08-05 NOTE — Assessment & Plan Note (Signed)
Managed on Crestor 5mg  daily. -Check cholesterol levels today. -Continue Crestor 5mg  daily.

## 2024-02-03 ENCOUNTER — Other Ambulatory Visit: Payer: Self-pay | Admitting: Family Medicine

## 2024-02-03 DIAGNOSIS — F3342 Major depressive disorder, recurrent, in full remission: Secondary | ICD-10-CM

## 2024-02-04 ENCOUNTER — Encounter: Payer: Self-pay | Admitting: Family Medicine

## 2024-02-04 LAB — COMPREHENSIVE METABOLIC PANEL
ALT: 17 [IU]/L (ref 0–32)
AST: 25 [IU]/L (ref 0–40)
Albumin: 4.3 g/dL (ref 3.9–4.9)
Alkaline Phosphatase: 99 [IU]/L (ref 44–121)
BUN/Creatinine Ratio: 15 (ref 12–28)
BUN: 13 mg/dL (ref 8–27)
Bilirubin Total: 0.3 mg/dL (ref 0.0–1.2)
CO2: 23 mmol/L (ref 20–29)
Calcium: 10 mg/dL (ref 8.7–10.3)
Chloride: 103 mmol/L (ref 96–106)
Creatinine, Ser: 0.87 mg/dL (ref 0.57–1.00)
Globulin, Total: 3 g/dL (ref 1.5–4.5)
Glucose: 99 mg/dL (ref 70–99)
Potassium: 4.3 mmol/L (ref 3.5–5.2)
Sodium: 144 mmol/L (ref 134–144)
Total Protein: 7.3 g/dL (ref 6.0–8.5)
eGFR: 73 mL/min/{1.73_m2} (ref >59)

## 2024-02-04 LAB — LIPID PANEL WITH LDL/HDL RATIO
Cholesterol, Total: 164 mg/dL (ref 100–199)
HDL: 54 mg/dL (ref >39)
LDL Chol Calc (NIH): 89 mg/dL (ref 0–99)
LDL/HDL Ratio: 1.6 {ratio} (ref 0.0–3.2)
Triglycerides: 118 mg/dL (ref 0–149)
VLDL Cholesterol Cal: 21 mg/dL (ref 5–40)

## 2024-02-04 LAB — HEMOGLOBIN A1C
Est. average glucose Bld gHb Est-mCnc: 128 mg/dL
Hgb A1c MFr Bld: 6.1 % — ABNORMAL HIGH (ref 4.8–5.6)

## 2024-02-04 NOTE — Telephone Encounter (Signed)
Requested medication (s) are due for refill today:   Yes for all 4  Requested medication (s) are on the active medication list:   Yes for all 4  Future visit scheduled:   Yes 2/13 with Dr. Leonard Schwartz.      Last ordered: All on 01/21/2023 #90, 3 refills each.  Returned because it looks like she had labs and maybe an OV on 02/03/2024 but this is not showing up except there are lab results showing from 02/03/2024.  LOV in chart is 08/05/2023.    There is an upcoming appt on 2/13 so wasn'[name] sure whether to refill these or not.   Referring to Dr. Leonard Schwartz. For review.      Requested Prescriptions  Pending Prescriptions Disp Refills   hydrochlorothiazide (HYDRODIURIL) 12.5 MG tablet [Pharmacy Med Name: HYDROCHLOROTHIAZIDE TABS 12.5MG ] 90 tablet 3    Sig: TAKE 1 TABLET DAILY WITH LOSARTAN 50 MG     Cardiovascular: Diuretics - Thiazide Failed - 02/04/2024  9:04 AM      Failed - Valid encounter within last 6 months    Recent Outpatient Visits           6 months ago Annual physical exam   [place] [facility], [name], MD   1 year ago Prediabetes   [facility] [place], [name], MD   1 year ago Post-viral cough syndrome   [place] [facility], [name], MD   1 year ago Annual physical exam   [name] [facility] [name] Norton T, [name]   2 years ago Chest pain, unspecified type   [facility] [place], [name], MD       Future Appointments             In 2 days [name] [name], [name], MD [facility]            Passed - Cr in normal range and within 180 days    Creatinine  Date Value Ref Range Status  01/11/2015 0.95 0.60 - 1.30 mg/dL Final   Creatinine, Ser  Date Value Ref Range Status  02/03/2024 0.87 0.57 - 1.00 mg/dL Final         Passed - K in normal range and within 180 days    Potassium  Date Value Ref Range Status  02/03/2024  4.3 3.5 - 5.2 mmol/L Final  01/11/2015 3.8 3.5 - 5.1 mmol/L Final         Passed - Na in normal range and within 180 days    Sodium  Date Value Ref Range Status  02/03/2024 144 134 - 144 mmol/L Final  01/11/2015 139 136 - 145 mmol/L Final         Passed - Last BP in normal range    BP Readings from Last 1 Encounters:  08/05/23 118/72          losartan (COZAAR) 50 MG tablet [Pharmacy Med Name: LOSARTAN TABS 50MG ] 90 tablet 3    Sig: TAKE 1 TABLET DAILY     Cardiovascular:  Angiotensin Receptor Blockers Failed - 02/04/2024  9:04 AM      Failed - Valid encounter within last 6 months    Recent Outpatient Visits           6 months ago Annual physical exam   [facility] [place], [name], MD   1 year ago Prediabetes   [place]  [facility] [name] [name], [name], MD   1 year ago Post-viral cough syndrome   [place] [facility] [place], [name], MD   1 year ago Annual physical exam   [place] [facility] [name] Norton T, [name]   2 years ago Chest pain, unspecified type   [facility] [place], [name], MD       Future Appointments             In 2 days [name] [name], [name], MD [facility]            Passed - Cr in normal range and within 180 days    Creatinine  Date Value Ref Range Status  01/11/2015 0.95 0.60 - 1.30 mg/dL Final   Creatinine, Ser  Date Value Ref Range Status  02/03/2024 0.87 0.57 - 1.00 mg/dL Final         Passed - K in normal range and within 180 days    Potassium  Date Value Ref Range Status  02/03/2024 4.3 3.5 - 5.2 mmol/L Final  01/11/2015 3.8 3.5 - 5.1 mmol/L Final         Passed - Patient is not pregnant      Passed - Last BP in normal range    BP Readings from Last 1 Encounters:  08/05/23 118/72          rosuvastatin (CRESTOR) 5 MG tablet [Pharmacy Med Name: ROSUVASTATIN TABS 5MG ] 90  tablet 3    Sig: TAKE 1 TABLET DAILY     Cardiovascular:  Antilipid - Statins 2 Failed - 02/04/2024  9:04 AM      Failed - Lipid Panel in normal range within the last 12 months    Cholesterol, Total  Date Value Ref Range Status  02/03/2024 164 100 - 199 mg/dL Final   Cholesterol  Date Value Ref Range Status  11/10/2013 185 0 - 200 mg/dL Final   Ldl Cholesterol, Calc  Date Value Ref Range Status  11/10/2013 103 (H) 0 - 100 mg/dL Final   LDL Chol Calc (NIH)  Date Value Ref Range Status  02/03/2024 89 0 - 99 mg/dL Final   HDL Cholesterol  Date Value Ref Range Status  11/10/2013 58 40 - 60 mg/dL Final   HDL  Date Value Ref Range Status  02/03/2024 54 >39 mg/dL Final   Triglycerides  Date Value Ref Range Status  02/03/2024 118 0 - 149 mg/dL Final  16/09/9603 540 0 - 200 mg/dL Final         Passed - Cr in normal range and within 360 days    Creatinine  Date Value Ref Range Status  01/11/2015 0.95 0.60 - 1.30 mg/dL Final   Creatinine, Ser  Date Value Ref Range Status  02/03/2024 0.87 0.57 - 1.00 mg/dL Final         Passed - Patient is not pregnant      Passed - Valid encounter within last 12 months    Recent Outpatient Visits           6 months ago Annual physical exam   [place] [facility] [place], [name], MD   1 year ago Prediabetes   [place] [facility] [place], [name], MD   1 year ago Post-viral cough syndrome   [facility] [place], [name], MD   1 year ago Annual physical exam   [facility] [name] Kindle, [name]   2 years ago Chest pain, unspecified type   [facility] [place], [name], MD       Future Appointments             In 2 days [name] [name], [name], MD [facility]             sertraline (ZOLOFT) 50 MG tablet [Pharmacy Med Name: SERTRALINE HCL TABS 50MG ] 90 tablet 3     Sig: TAKE 1 TABLET EVERY MORNING     Psychiatry:  Antidepressants - SSRI - sertraline Failed - 02/04/2024  9:04 AM      Failed - Valid encounter within last 6 months    Recent Outpatient Visits           6 months ago Annual physical exam   [place] [facility], [name], MD   1 year ago Prediabetes   [place] [facility] [place], [name], MD   1 year ago Post-viral cough syndrome   [place] [facility] [place], [name], MD   1 year ago Annual physical exam   [facility] [facility] [name] Norton T, [name]   2 years ago Chest pain, unspecified type   [facility], [name], MD       Future Appointments             In 2 days [name] [name], [name], MD [facility]            Passed - AST in normal range and within 360 days    AST  Date Value Ref Range Status  02/03/2024 25 0 - 40 IU/L Final   SGOT(AST)  Date Value Ref Range Status  01/11/2015 20 15 - 37 Unit/L Final         Passed - ALT in normal range and within 360 days    ALT  Date Value Ref Range Status  02/03/2024 17 0 - 32 IU/L Final   SGPT (ALT)  Date Value Ref Range Status  01/11/2015 25 U/L Final    Comment:    14-63 NOTE: New Reference Range 07/13/14          Passed - Completed PHQ-2 or PHQ-9 in the last 360 days

## 2024-02-06 ENCOUNTER — Encounter: Payer: Self-pay | Admitting: Family Medicine

## 2024-02-06 ENCOUNTER — Ambulatory Visit: Payer: Medicare Other | Admitting: Family Medicine

## 2024-02-06 VITALS — BP 104/72 | HR 78 | Ht 64.5 in | Wt 185.5 lb

## 2024-02-06 DIAGNOSIS — R7303 Prediabetes: Secondary | ICD-10-CM | POA: Diagnosis not present

## 2024-02-06 DIAGNOSIS — F3342 Major depressive disorder, recurrent, in full remission: Secondary | ICD-10-CM | POA: Diagnosis not present

## 2024-02-06 DIAGNOSIS — I10 Essential (primary) hypertension: Secondary | ICD-10-CM

## 2024-02-06 DIAGNOSIS — E781 Pure hyperglyceridemia: Secondary | ICD-10-CM

## 2024-02-06 DIAGNOSIS — F5104 Psychophysiologic insomnia: Secondary | ICD-10-CM | POA: Diagnosis not present

## 2024-02-06 DIAGNOSIS — I471 Supraventricular tachycardia, unspecified: Secondary | ICD-10-CM

## 2024-02-06 MED ORDER — LOSARTAN POTASSIUM 50 MG PO TABS: 50.0000 mg | ORAL_TABLET | Freq: Every day | ORAL | 3 refills | Status: DC

## 2024-02-06 MED ORDER — HYDROCHLOROTHIAZIDE 12.5 MG PO TABS: ORAL_TABLET | ORAL | 3 refills | Status: DC

## 2024-02-06 MED ORDER — TRAZODONE HCL 100 MG PO TABS: 100.0000 mg | ORAL_TABLET | Freq: Every evening | ORAL | 1 refills | Status: DC | PRN

## 2024-02-06 MED ORDER — ROSUVASTATIN CALCIUM 5 MG PO TABS: 5.0000 mg | ORAL_TABLET | Freq: Every day | ORAL | 3 refills | Status: DC

## 2024-02-06 MED ORDER — SERTRALINE HCL 50 MG PO TABS: 50.0000 mg | ORAL_TABLET | ORAL | 3 refills | Status: DC

## 2024-02-06 NOTE — Progress Notes (Signed)
Established patient visit   Patient: [name] [name]   DOB: Aug 09, 1956   68 y.o. Female  MRN: [id number] Visit Date: 02/06/2024  Today's healthcare provider: Shirlee Latch, MD   Chief Complaint  Patient presents with   Medical Management of Chronic Issues   Hypertension    Monitor when feeling symptoms but not daily. Reports palpitations but aware of SVT   Hyperlipidemia    Tingling in toes but ankle that she had surgey on and nerve was pinched   Subjective    Hypertension  Hyperlipidemia   HPI     Hypertension    Additional comments: Monitor when feeling symptoms but not daily. Reports palpitations but aware of SVT        Hyperlipidemia    Additional comments: Tingling in toes but ankle that she had surgey on and nerve was pinched      Last edited by Acey Lav, CMA on 02/06/2024  1:04 PM.       Discussed the use of AI scribe software for clinical note transcription with the patient, who gave verbal consent to proceed.  History of Present Illness   The patient, with a history of hypertension, PSVT, hyperlipidemia, and mood disorder, presents for a routine follow-up visit. She reports feeling tired, attributing this to life stressors. She is currently on diltiazem 30mg  as needed for PSVT, hydrochlorothiazide 12.5mg , and losartan 50mg  daily for hypertension, Crestor 5mg  daily for hyperlipidemia, and Zoloft 50mg  daily for mood management. She also takes trazodone for sleep, but not on nights when she has certain responsibilities. The patient's blood pressure is well controlled. She has prediabetes, with an HbA1c that fluctuates between 5.7 and 6.1. She expresses a preference for lifestyle modifications over starting metformin to manage her prediabetes.          02/06/2024    1:05 PM 08/05/2023    2:27 PM 02/20/2023    2:39 PM 11/08/2022    8:19 AM 07/19/2022    8:54 AM  Depression screen PHQ 2/9  Decreased Interest 0 0 0 0 0  Down, Depressed,  Hopeless 0 0 0 0 0  PHQ - 2 Score 0 0 0 0 0  Altered sleeping 1 0  0   Tired, decreased energy 1 1  2    Change in appetite 0 0  0   Feeling bad or failure about yourself  0 0  0   Trouble concentrating 0 0  0   Moving slowly or fidgety/restless  0  0   Suicidal thoughts 0 0  0   PHQ-9 Score 2 1  2    Difficult doing work/chores Not difficult at all Not difficult at all  Not difficult at all        02/06/2024    1:05 PM  GAD 7 : Generalized Anxiety Score  Nervous, Anxious, on Edge 0  Control/stop worrying 0  Worry too much - different things 0  Trouble relaxing 0  Restless 0  Easily annoyed or irritable 0  Afraid - awful might happen 0  Total GAD 7 Score 0  Anxiety Difficulty Not difficult at all      Medications: Outpatient Medications Prior to Visit  Medication Sig   diltiazem (CARDIZEM) 30 MG tablet Take 1 tablet (30 mg total) by mouth 3 (three) times daily as needed. For fast heart rate   Ivermectin 1 % CREA Apply topically to affected area daily   ketoconazole (NIZORAL) 2 % shampoo Apply topically 3 (three)  times a week.   Multiple Vitamin (MULTIVITAMIN) capsule Take 1 capsule by mouth daily.    PFIZER COVID-19 VAC BIVALENT injection    [DISCONTINUED] hydrochlorothiazide (HYDRODIURIL) 12.5 MG tablet TAKE ONE TABLET EVERY DAY WITH LOSARTAN 50MG    [DISCONTINUED] losartan (COZAAR) 50 MG tablet Take 1 tablet (50 mg total) by mouth daily.   [DISCONTINUED] rosuvastatin (CRESTOR) 5 MG tablet Take 1 tablet (5 mg total) by mouth daily.   [DISCONTINUED] sertraline (ZOLOFT) 50 MG tablet Take 1 tablet (50 mg total) by mouth every morning.   [DISCONTINUED] traZODone (DESYREL) 100 MG tablet Take 1 tablet (100 mg total) by mouth at bedtime as needed for sleep.   No facility-administered medications prior to visit.    Review of Systems     Objective    BP 104/72 (BP Location: Left Arm, Patient Position: Sitting, Cuff Size: Normal)   Pulse 78   Ht 5' 4.5" (1.638 m)   Wt 185 lb  8 oz (84.1 kg)   SpO2 100%   BMI 31.35 kg/m    Physical Exam Vitals reviewed.  Constitutional:      General: She is not in acute distress.    Appearance: Normal appearance. She is well-developed. She is not diaphoretic.  HENT:     Head: Normocephalic and atraumatic.  Eyes:     General: No scleral icterus.    Conjunctiva/sclera: Conjunctivae normal.  Neck:     Thyroid: No thyromegaly.  Cardiovascular:     Rate and Rhythm: Normal rate and regular rhythm.     Pulses: Normal pulses.     Heart sounds: Normal heart sounds. No murmur heard. Pulmonary:     Effort: Pulmonary effort is normal. No respiratory distress.     Breath sounds: Normal breath sounds. No wheezing, rhonchi or rales.  Musculoskeletal:     Cervical back: Neck supple.     Right lower leg: No edema.     Left lower leg: No edema.  Lymphadenopathy:     Cervical: No cervical adenopathy.  Skin:    General: Skin is warm and dry.     Findings: No rash.  Neurological:     Mental Status: She is alert and oriented to person, place, and time. Mental status is at baseline.  Psychiatric:        Mood and Affect: Mood normal.        Behavior: Behavior normal.      No results found for any visits on 02/06/24.  Assessment & Plan     Problem List Items Addressed This Visit       Cardiovascular and Mediastinum   PSVT (paroxysmal supraventricular tachycardia) (HCC)   PSVT managed with diltiazem as needed for elevated heart rate. Patient reports infrequent use of medication. - Continue diltiazem 30 mg TID as needed      Relevant Medications   hydrochlorothiazide (HYDRODIURIL) 12.5 MG tablet   losartan (COZAAR) 50 MG tablet   rosuvastatin (CRESTOR) 5 MG tablet   Primary hypertension - Primary   Blood pressure well-controlled on current medications. - Continue hydrochlorothiazide 12.5 mg daily - Continue losartan 50 mg daily      Relevant Medications   hydrochlorothiazide (HYDRODIURIL) 12.5 MG tablet   losartan  (COZAAR) 50 MG tablet   rosuvastatin (CRESTOR) 5 MG tablet     Other   Hypertriglyceridemia   Cholesterol levels consistently improved with current medication regimen. - Continue rosuvastatin 5 mg daily      Relevant Medications   hydrochlorothiazide (HYDRODIURIL) 12.5 MG tablet  losartan (COZAAR) 50 MG tablet   rosuvastatin (CRESTOR) 5 MG tablet   Psychophysiological insomnia   Uses trazodone for sleep as needed. No issues reported. - Continue trazodone as needed for sleep      Prediabetes   A1c fluctuates between 5.7 and 6.1, currently at 6.1. Discussed metformin for insulin sensitivity, but patient prefers lifestyle modifications. Emphasized preventing progression to diabetes. - Monitor carbohydrate intake - Encourage regular exercise - Reassess A1c at next visit      Recurrent major depressive disorder, in full remission (HCC)   Mood well-managed on current medication. - Continue sertraline 50 mg daily      Relevant Medications   sertraline (ZOLOFT) 50 MG tablet   traZODone (DESYREL) 100 MG tablet     Return in about 6 months (around 08/05/2024) for CPE.       Shirlee Latch, MD  [facility] [phone] (phone) [phone] (fax)  [facility]

## 2024-02-06 NOTE — Assessment & Plan Note (Signed)
Uses trazodone for sleep as needed. No issues reported. - Continue trazodone as needed for sleep

## 2024-02-06 NOTE — Assessment & Plan Note (Signed)
PSVT managed with diltiazem as needed for elevated heart rate. Patient reports infrequent use of medication. - Continue diltiazem 30 mg TID as needed

## 2024-02-06 NOTE — Assessment & Plan Note (Signed)
Mood well-managed on current medication. - Continue sertraline 50 mg daily

## 2024-02-06 NOTE — Assessment & Plan Note (Signed)
Blood pressure well-controlled on current medications. - Continue hydrochlorothiazide 12.5 mg daily - Continue losartan 50 mg daily

## 2024-02-06 NOTE — Assessment & Plan Note (Signed)
A1c fluctuates between 5.7 and 6.1, currently at 6.1. Discussed metformin for insulin sensitivity, but patient prefers lifestyle modifications. Emphasized preventing progression to diabetes. - Monitor carbohydrate intake - Encourage regular exercise - Reassess A1c at next visit

## 2024-02-06 NOTE — Assessment & Plan Note (Signed)
Cholesterol levels consistently improved with current medication regimen. - Continue rosuvastatin 5 mg daily

## 2024-02-19 ENCOUNTER — Encounter: Payer: Self-pay | Admitting: Emergency Medicine

## 2024-02-19 DIAGNOSIS — N200 Calculus of kidney: Secondary | ICD-10-CM | POA: Insufficient documentation

## 2024-02-28 ENCOUNTER — Ambulatory Visit
Admission: RE | Admit: 2024-02-28 | Discharge: 2024-02-28 | Disposition: A | Discharge status: Home / Self Care | Source: Ambulatory Visit | Attending: Emergency Medicine | Admitting: Emergency Medicine

## 2024-02-28 VITALS — BP 121/78 | HR 74 | Temp 98.4°F | Resp 18

## 2024-02-28 DIAGNOSIS — J0100 Acute maxillary sinusitis, unspecified: Secondary | ICD-10-CM

## 2024-02-28 MED ORDER — AMOXICILLIN-POT CLAVULANATE 875-125 MG PO TABS: 1.0000 | ORAL_TABLET | Freq: Two times a day (BID) | ORAL | 0 refills | Status: DC

## 2024-02-28 NOTE — ED Provider Notes (Signed)
 [facility]    CSN: [id number] Arrival date & time: 02/28/24  1150      History   Chief Complaint Chief Complaint  Patient presents with   Cough    Cough and Runny nose thick yellow tan drainage.. headache.. light headed.. body aches.. fatigue.. - Entered by patient    HPI [name] [name] is a 68 y.o. female.  Patient presents with 10-day history of congestion, cough, postnasal drip, sinus pressure, headache.  No fever, shortness of breath.  Treating symptoms with OTC cough and cold medication.  The history is provided by the patient and medical records.    Past Medical History:  Diagnosis Date   Anxiety    not an issue anymore. this was due to broken ankle   Arthritis    BACK   Complication of anesthesia    SLOW TO WAKE UP X 1 WITH ANKLE SURGERY   GERD (gastroesophageal reflux disease)    OCC-NO MEDS   History of kidney stones    Hypertension 02/19/2017   Hypertriglyceridemia 06/25/2016   Osteopenia 06/25/2016   PONV (postoperative nausea and vomiting)     Patient Active Problem List   Diagnosis Date Noted   Kidney stones 02/19/2024   Recurrent major depressive disorder, in full remission (HCC) 07/19/2022   Primary hypertension 07/19/2022   PSVT (paroxysmal supraventricular tachycardia) (HCC) 06/16/2021   Prediabetes 04/01/2019   Hypertriglyceridemia 06/25/2016   Psychophysiological insomnia 03/28/2007    Past Surgical History:  Procedure Laterality Date   ANKLE FRACTURE SURGERY Left 2012   CHOLECYSTECTOMY     COLONOSCOPY WITH PROPOFOL N/A 05/05/2020   Procedure: COLONOSCOPY WITH PROPOFOL;  Surgeon: Toledo, Boykin Nearing, MD;  Location: [facility];  Service: Gastroenterology;  Laterality: N/A;   CYSTOSCOPY/URETEROSCOPY/HOLMIUM LASER/STENT PLACEMENT Left 09/05/2018   Procedure: CYSTOSCOPY/URETEROSCOPY/HOLMIUM LASER/STENT PLACEMENT;  Surgeon: Sondra Come, MD;  Location: [facility];  Service: Urology;  Laterality: Left;   EXTRACORPOREAL SHOCK WAVE  LITHOTRIPSY Left 08/21/2018   Procedure: EXTRACORPOREAL SHOCK WAVE LITHOTRIPSY (ESWL);  Surgeon: Riki Altes, MD;  Location: [facility];  Service: Urology;  Laterality: Left;   FOOT SURGERY Right    neuroma removed   LITHOTRIPSY  1990'S   TUBAL LIGATION     VEIN LIGATION AND STRIPPING Left     OB History   No obstetric history on file.      Home Medications    Prior to Admission medications   Medication Sig Start Date End Date Taking? Authorizing Provider  amoxicillin-clavulanate (AUGMENTIN) 875-125 MG tablet Take 1 tablet by mouth every 12 (twelve) hours. 02/28/24  Yes Mickie Bail, NP  diltiazem (CARDIZEM) 30 MG tablet Take 1 tablet (30 mg total) by mouth 3 (three) times daily as needed. For fast heart rate 01/19/22   Gollan, Tollie Pizza, MD  hydrochlorothiazide (HYDRODIURIL) 12.5 MG tablet TAKE ONE TABLET EVERY DAY WITH LOSARTAN 50MG  02/06/24   Erasmo Downer, MD  Ivermectin 1 % CREA Apply topically to affected area daily 01/21/23   Bacigalupo, Marzella Schlein, MD  ketoconazole (NIZORAL) 2 % shampoo Apply topically 3 (three) times a week. 01/21/23   Erasmo Downer, MD  losartan (COZAAR) 50 MG tablet Take 1 tablet (50 mg total) by mouth daily. 02/06/24   Erasmo Downer, MD  Multiple Vitamin (MULTIVITAMIN) capsule Take 1 capsule by mouth daily.     [provider]  PFIZER COVID-19 VAC BIVALENT injection  11/14/21   [provider]  rosuvastatin (CRESTOR) 5 MG tablet Take 1  tablet (5 mg total) by mouth daily. 02/06/24   Erasmo Downer, MD  sertraline (ZOLOFT) 50 MG tablet Take 1 tablet (50 mg total) by mouth every morning. 02/06/24   Erasmo Downer, MD  traZODone (DESYREL) 100 MG tablet Take 1 tablet (100 mg total) by mouth at bedtime as needed for sleep. 02/06/24   Bacigalupo, Marzella Schlein, MD    Family History Family History  Problem Relation Age of Onset   Breast cancer Maternal Aunt 69   Kidney disease Mother    Alzheimer's disease Mother     Cancer Father     Social History Social History   Tobacco Use   Smoking status: Never   Smokeless tobacco: Never  Vaping Use   Vaping status: Never Used  Substance Use Topics   Alcohol use: No   Drug use: No     Allergies   Oxycodone-acetaminophen   Review of Systems Review of Systems  Constitutional:  Negative for chills and fever.  HENT:  Positive for congestion, postnasal drip, rhinorrhea and sinus pressure. Negative for ear pain and sore throat.   Respiratory:  Positive for cough. Negative for shortness of breath.      Physical Exam Triage Vital Signs ED Triage Vitals  Encounter Vitals Group     BP 02/28/24 1207 121/78     Systolic BP Percentile --      Diastolic BP Percentile --      Pulse Rate 02/28/24 1207 74     Resp 02/28/24 1207 18     Temp 02/28/24 1207 98.4 F (36.9 C)     Temp src --      SpO2 02/28/24 1207 96 %     Weight --      Height --      Head Circumference --      Peak Flow --      Pain Score 02/28/24 1209 5     Pain Loc --      Pain Education --      Exclude from Growth Chart --    No data found.  Updated Vital Signs BP 121/78   Pulse 74   Temp 98.4 F (36.9 C)   Resp 18   SpO2 96%   Visual Acuity Right Eye Distance:   Left Eye Distance:   Bilateral Distance:    Right Eye Near:   Left Eye Near:    Bilateral Near:     Physical Exam Constitutional:      General: She is not in acute distress. HENT:     Right Ear: Tympanic membrane normal.     Left Ear: Tympanic membrane normal.     Nose: Congestion and rhinorrhea present.     Mouth/Throat:     Mouth: Mucous membranes are moist.     Pharynx: Oropharynx is clear.  Cardiovascular:     Rate and Rhythm: Normal rate and regular rhythm.     Heart sounds: Normal heart sounds.  Pulmonary:     Effort: Pulmonary effort is normal. No respiratory distress.     Breath sounds: Normal breath sounds.  Neurological:     Mental Status: She is alert.      UC Treatments /  Results  Labs (all labs ordered are listed, but only abnormal results are displayed) Labs Reviewed - No data to display  EKG   Radiology No results found.  Procedures Procedures (including critical care time)  Medications Ordered in UC Medications - No data to display  Initial  Impression / Assessment and Plan / UC Course  I have reviewed the triage vital signs and the nursing notes.  Pertinent labs & imaging results that were available during my care of the patient were reviewed by me and considered in my medical decision making (see chart for details).    Acute sinusitis.  Patient has been symptomatic for 10 days and is not improving with OTC treatment.  Treating today with Augmentin.  Tylenol or ibuprofen as needed.  Plain Mucinex as needed.  Instructed her to follow-up with her PCP if she is not improving.  Education provided on sinus infection.  She agrees to plan of care.  Final Clinical Impressions(s) / UC Diagnoses   Final diagnoses:  Acute non-recurrent maxillary sinusitis     Discharge Instructions      Take the Augmentin as directed.  Follow-up with your primary care provider if your symptoms are not improving.      ED Prescriptions     Medication Sig Dispense Auth. Provider   amoxicillin-clavulanate (AUGMENTIN) 875-125 MG tablet Take 1 tablet by mouth every 12 (twelve) hours. 14 tablet Mickie Bail, NP      PDMP not reviewed this encounter.   Mickie Bail, NP 02/28/24 1239

## 2024-02-28 NOTE — Discharge Instructions (Addendum)
 Take the Augmentin as directed.  Follow up with your primary care provider if your symptoms are not improving.

## 2024-02-28 NOTE — ED Triage Notes (Signed)
 Patient to Urgent Care with complaints of productive cough/ nasal congestion/ drainage/ headache/ fatigue.  Reports symptoms started ten days ago. Denies any recent fevers.   Meds: cold/ cold [facility].

## 2024-03-31 ENCOUNTER — Ambulatory Visit
Admission: AD | Admit: 2024-03-31 | Discharge: 2024-03-31 | Disposition: A | Discharge status: Home / Self Care | Attending: Family Medicine | Admitting: Family Medicine

## 2024-03-31 ENCOUNTER — Ambulatory Visit
Admission: RE | Admit: 2024-03-31 | Discharge: 2024-03-31 | Disposition: A | Discharge status: Home / Self Care | Source: Ambulatory Visit | Attending: Family Medicine | Admitting: Family Medicine

## 2024-03-31 ENCOUNTER — Ambulatory Visit (INDEPENDENT_AMBULATORY_CARE_PROVIDER_SITE_OTHER): Admitting: Family Medicine

## 2024-03-31 VITALS — BP 115/67 | HR 71 | Ht 64.5 in | Wt 184.6 lb

## 2024-03-31 DIAGNOSIS — R0781 Pleurodynia: Secondary | ICD-10-CM | POA: Insufficient documentation

## 2024-03-31 DIAGNOSIS — M546 Pain in thoracic spine: Secondary | ICD-10-CM | POA: Insufficient documentation

## 2024-03-31 DIAGNOSIS — R051 Acute cough: Secondary | ICD-10-CM

## 2024-03-31 DIAGNOSIS — G8929 Other chronic pain: Secondary | ICD-10-CM | POA: Insufficient documentation

## 2024-03-31 NOTE — Progress Notes (Signed)
 Acute visit   Patient: [name] [name]   DOB: 11-19-1956   68 y.o. Female  MRN: [id number] PCP: Erasmo Downer, MD   Chief Complaint  Patient presents with   Pain    Right side sharp rib pain associated with a cough X 10 days. Reports pain is up near rib cage under right breast describes as a stabbing sharp pain. Reports pain is constant but becomes worse during certain activities. Patient reports taking motrin or tylenol with minor relief.    Subjective    Discussed the use of AI scribe software for clinical note transcription with the patient, who gave verbal consent to proceed.  History of Present Illness   The patient presents with a 10-day history of right-sided rib pain, described as a constant, sharp sensation that feels like 'bone pain.' The pain is worse with sitting, twisting, bending, and coughing. She denies any trauma or injury to the area. She has been managing the pain with Tylenol and Motrin.  In addition to the rib pain, the patient has a persistent cough that started before the onset of the rib pain. She describes the cough as 'annoying' and 'naggy,' but has not sought treatment for it. She denies any fever, shortness of breath, or allergy symptoms.  The patient also mentions a history of SVT, for which she had to take medication recently due to a feeling of 'nervousness' and a racing heart. She also reports chronic mid-back pain, which she attributes to lifting her grandchild.        Review of Systems  Objective    BP 115/67 (BP Location: Left Arm, Patient Position: Sitting, Cuff Size: Large)   Pulse 71   Ht 5' 4.5" (1.638 m)   Wt 184 lb 9.6 oz (83.7 kg)   SpO2 98%   BMI 31.20 kg/m  Physical Exam Vitals reviewed.  Constitutional:      General: She is not in acute distress.    Appearance: Normal appearance. She is well-developed. She is not diaphoretic.  HENT:     Head: Normocephalic and atraumatic.  Eyes:     General: No scleral  icterus.    Conjunctiva/sclera: Conjunctivae normal.  Neck:     Thyroid: No thyromegaly.  Cardiovascular:     Rate and Rhythm: Normal rate and regular rhythm.     Heart sounds: Normal heart sounds. No murmur heard.    Comments: TTP on R anterior lower rib cage Pulmonary:     Effort: Pulmonary effort is normal. No respiratory distress.     Breath sounds: Normal breath sounds. No wheezing, rhonchi or rales.  Musculoskeletal:     Cervical back: Neck supple.     Right lower leg: No edema.     Left lower leg: No edema.  Lymphadenopathy:     Cervical: No cervical adenopathy.  Skin:    General: Skin is warm and dry.     Findings: No rash.  Neurological:     Mental Status: She is alert and oriented to person, place, and time. Mental status is at baseline.  Psychiatric:        Mood and Affect: Mood normal.        Behavior: Behavior normal.       No results found for any visits on 03/31/24.  Assessment & Plan     Problem List Items Addressed This Visit   None Visit Diagnoses       Rib pain on right side    -  Primary   Relevant Orders   DG Chest 2 View     Chronic bilateral thoracic back pain       Relevant Orders   DG Thoracic Spine 2 View     Acute cough       Relevant Orders   DG Chest 2 View           Right-sided rib pain Presents with right-sided rib pain for ten days, constant and sharp, exacerbated by twisting, bending, and coughing. Tender to palpation under the right rib. No trauma history. Differential includes musculoskeletal strain, rib fracture, or less likely, pneumonia given absence of fever and overall well appearance. Quieter breath sounds on the right side suggest possible rib fracture or lung issue. - Order chest x-ray to evaluate ribs and underlying lung. - Continue acetaminophen and ibuprofen for analgesia. - Encourage deep breathing exercises to maintain pulmonary function.  Cough Persistent cough predating rib pain, described as nagging without  fever, dyspnea, or allergy symptoms. Etiology unclear, not severe enough for specific treatment currently.  Thoracic back pain Mid-back pain from mid-thoracic region to waist, persisting for months without trauma or specific activity association. Some improvement with rest but discomfort persists. - Order thoracic spine x-ray to evaluate for structural abnormalities.  Supraventricular tachycardia (SVT) Experienced an episode of racing heart and chest discomfort, requiring two medication doses for control. First episode since diagnosis, indicating possible SVT recurrence. - Monitor for recurrence of SVT symptoms and adjust treatment as necessary.       No orders of the defined types were placed in this encounter.    Return if symptoms worsen or fail to improve.      Total time spent on today's visit was greater than 40 minutes, including both face-to-face time and nonface-to-face time personally spent on review of chart (labs and imaging), discussing labs and goals, discussing further work-up, treatment options, answering patient's questions, and coordinating care.   Shirlee Latch, MD  [facility] [phone] (phone) [phone] (fax)  [facility]

## 2024-04-01 ENCOUNTER — Ambulatory Visit: Payer: Medicare Other

## 2024-04-01 VITALS — Ht 65.0 in | Wt 184.6 lb

## 2024-04-01 DIAGNOSIS — Z0000 Encounter for general adult medical examination without abnormal findings: Secondary | ICD-10-CM

## 2024-04-01 NOTE — Patient Instructions (Addendum)
 [name] [name] , Thank you for taking time to come for your Medicare Wellness Visit. I appreciate your ongoing commitment to your health goals. Please review the following plan we discussed and let me know if I can assist you in the future.   Referrals/Orders/Follow-Ups/Clinician Recommendations: Keep maintaining your health by keeping your appointments with Dr. Beryle Flock and any specialists that you may see.  Call us if you need anything.  Have a great year!!!!  This is a list of the screening recommended for you and due dates:  Health Maintenance  Topic Date Due   Zoster (Shingles) Vaccine (1 of 2) 08/22/2005   COVID-19 Vaccine (8 - 2024-25 season) 08/25/2023   Flu Shot  07/24/2024   DEXA scan (bone density measurement)  02/23/2025   Medicare Annual Wellness Visit  04/01/2025   Mammogram  05/02/2025   DTaP/Tdap/Td vaccine (3 - Td or Tdap) 09/30/2028   Colon Cancer Screening  05/06/2030   Pneumonia Vaccine  Completed   Hepatitis C Screening  Completed   HPV Vaccine  Aged Out    Advanced directives: (Declined) Advance directive discussed with you today. Even though you declined this today, please call our office should you change your mind, and we can give you the proper paperwork for you to fill out.  Next Medicare Annual Wellness Visit scheduled for next year: Yes

## 2024-04-01 NOTE — Progress Notes (Addendum)
 Because this visit was a virtual/telehealth visit,  certain criteria was not obtained, such a blood pressure, CBG if applicable, and timed get up and go. Any medications not marked as "taking" were not mentioned during the medication reconciliation part of the visit. Any vitals not documented were not able to be obtained due to this being a telehealth visit or patient was unable to self-report a recent blood pressure reading due to a lack of equipment at home via telehealth. Vitals that have been documented are verbally provided by the patient.   Subjective:   [name] [name] is a 68 y.o. who presents for a Medicare Wellness preventive visit.  Visit Complete: Virtual I connected with  [name] [name] on 04/01/24 by a audio enabled telemedicine application and verified that I am speaking with the correct person using two identifiers.  Patient Location: Home  Provider Location: Office/Clinic  I discussed the limitations of evaluation and management by telemedicine. The patient expressed understanding and agreed to proceed.  Vital Signs: Because this visit was a virtual/telehealth visit, some criteria may be missing or patient reported. Any vitals not documented were not able to be obtained and vitals that have been documented are patient reported.  VideoDeclined- This patient declined Librarian, academic. Therefore the visit was completed with audio only.  Persons Participating in Visit: Patient.  AWV Questionnaire: Yes: Patient Medicare AWV questionnaire was completed by the patient on 03/31/2024; I have confirmed that all information answered by patient is correct and no changes since this date.  Cardiac Risk Factors include: advanced age (>41men, >61 women);dyslipidemia;hypertension;obesity (BMI >30kg/m2);sedentary lifestyle     Objective:    Today's Vitals   04/01/24 1308 04/01/24 1309  Weight: 184 lb 9.6 oz (83.7 kg)   Height: 5\' 5"  (1.651 m)    PainSc: 0-No pain 0-No pain   Body mass index is 30.72 kg/m.     04/01/2024    1:19 PM 02/28/2024   12:08 PM 02/20/2023    2:42 PM 10/13/2022    1:54 PM 02/19/2022    3:50 PM 02/13/2021    2:39 PM 05/05/2020   10:38 AM  Advanced Directives  Does Patient Have a Medical Advance Directive? No No Yes No No No Yes  Type of Advance Directive       Living will;Healthcare Power of Attorney  Would patient like information on creating a medical advance directive? No - Patient declined    No - Patient declined No - Patient declined     Current Medications (verified) Outpatient Encounter Medications as of 04/01/2024  Medication Sig   amoxicillin-clavulanate (AUGMENTIN) 875-125 MG tablet Take 1 tablet by mouth every 12 (twelve) hours.   diltiazem (CARDIZEM) 30 MG tablet Take 1 tablet (30 mg total) by mouth 3 (three) times daily as needed. For fast heart rate   hydrochlorothiazide (HYDRODIURIL) 12.5 MG tablet TAKE ONE TABLET EVERY DAY WITH LOSARTAN 50MG    Ivermectin 1 % CREA Apply topically to affected area daily   ketoconazole (NIZORAL) 2 % shampoo Apply topically 3 (three) times a week.   losartan (COZAAR) 50 MG tablet Take 1 tablet (50 mg total) by mouth daily.   Multiple Vitamin (MULTIVITAMIN) capsule Take 1 capsule by mouth daily.    PFIZER COVID-19 VAC BIVALENT injection    rosuvastatin (CRESTOR) 5 MG tablet Take 1 tablet (5 mg total) by mouth daily.   sertraline (ZOLOFT) 50 MG tablet Take 1 tablet (50 mg total) by mouth every morning.   traZODone (  DESYREL) 100 MG tablet Take 1 tablet (100 mg total) by mouth at bedtime as needed for sleep.   No facility-administered encounter medications on file as of 04/01/2024.    Allergies (verified) Oxycodone-acetaminophen   History: Past Medical History:  Diagnosis Date   Anxiety    not an issue anymore. this was due to broken ankle   Arthritis    BACK   Cataract    Complication of anesthesia    SLOW TO WAKE UP X 1 WITH ANKLE SURGERY    Depression    treated with zoloft no longer see psychiatrist   GERD (gastroesophageal reflux disease)    OCC-NO MEDS   History of kidney stones    Hypertension 02/19/2017   Hypertriglyceridemia 06/25/2016   Osteopenia 06/25/2016   PONV (postoperative nausea and vomiting)    Past Surgical History:  Procedure Laterality Date   ANKLE FRACTURE SURGERY Left 2012   CHOLECYSTECTOMY     COLONOSCOPY WITH PROPOFOL N/A 05/05/2020   Procedure: COLONOSCOPY WITH PROPOFOL;  Surgeon: Toledo, Boykin Nearing, MD;  Location: [facility];  Service: Gastroenterology;  Laterality: N/A;   CYSTOSCOPY/URETEROSCOPY/HOLMIUM LASER/STENT PLACEMENT Left 09/05/2018   Procedure: CYSTOSCOPY/URETEROSCOPY/HOLMIUM LASER/STENT PLACEMENT;  Surgeon: Sondra Come, MD;  Location: [facility] OR[facility];  Service: Urology;  Laterality: Left;   EXTRACORPOREAL SHOCK WAVE LITHOTRIPSY Left 08/21/2018   Procedure: EXTRACORPOREAL SHOCK WAVE LITHOTRIPSY (ESWL);  Surgeon: Riki Altes, MD;  Location: [facility] ORS;  Service: Urology;  Laterality: Left;   FOOT SURGERY Right    neuroma removed   FRACTURE SURGERY     LITHOTRIPSY  1990'S   TUBAL LIGATION     VEIN LIGATION AND STRIPPING Left    Family History  Problem Relation Age of Onset   Breast cancer Maternal Aunt 54   Kidney disease Mother    Alzheimer's disease Mother    Varicose Veins Mother    Cancer Father    Social History   Socioeconomic History   Marital status: Married    Spouse name: Rosanne Ashing   Number of children: 6   Years of education: Not on file   Highest education level: Associate degree: academic program  Occupational History    Comment: retired  Tobacco Use   Smoking status: Never   Smokeless tobacco: Never  Vaping Use   Vaping status: Never Used  Substance and Sexual Activity   Alcohol use: No   Drug use: No   Sexual activity: Not on file  Other Topics Concern   Not on file  Social History Narrative   Not on file   Social Drivers of Health   Financial  Resource Strain: Low Risk  (04/01/2024)   Overall Financial Resource Strain (CARDIA)    Difficulty of Paying Living Expenses: Not hard at all  Food Insecurity: No Food Insecurity (04/01/2024)   Hunger Vital Sign    Worried About Running Out of Food in the Last Year: Never true    Ran Out of Food in the Last Year: Never true  Transportation Needs: No Transportation Needs (04/01/2024)   PRAPARE - Administrator, Civil Service (Medical): No    Lack of Transportation (Non-Medical): No  Physical Activity: Insufficiently Active (04/01/2024)   Exercise Vital Sign    Days of Exercise per Week: 2 days    Minutes of Exercise per Session: 20 min  Stress: No Stress Concern Present (04/01/2024)   [employer] of Occupational Health - Occupational Stress Questionnaire    Feeling of Stress : Not at all  Social Connections: Moderately Integrated (04/01/2024)   Social Connection and Isolation Panel [NHANES]    Frequency of Communication with Friends and Family: More than three times a week    Frequency of Social Gatherings with Friends and Family: Twice a week    Attends Religious Services: More than 4 times per year    Active Member of [employer] or Organizations: No    Attends Engineer, structural: Not on file    Marital Status: Married    Tobacco Counseling Counseling given: Not Answered    Clinical Intake:  Pre-visit preparation completed: Yes  Pain : No/denies pain Pain Score: 0-No pain     BMI - recorded: 30.72 Nutritional Risks: None Diabetes: No  Lab Results  Component Value Date   HGBA1C 6.1 (H) 02/03/2024   HGBA1C 5.8 (H) 01/21/2023   HGBA1C 5.8 (H) 10/08/2022     How often do you need to have someone help you when you read instructions, pamphlets, or other written materials from your doctor or pharmacy?: 1 - Never What is the last grade level you completed in school?: ASSOCIATE'S DEGREE  Interpreter Needed?: No  Information entered by :: Ceylon Arenson N.  Numan Zylstra, LPN.   Activities of Daily Living     04/01/2024    1:20 PM 03/31/2024    8:56 AM  In your present state of health, do you have any difficulty performing the following activities:  Hearing? 0 0  Vision? 0 0  Difficulty concentrating or making decisions? 0 0  Walking or climbing stairs? 0 0  Dressing or bathing? 0 0  Doing errands, shopping? 0 0  Preparing Food and eating ? N N  Using the Toilet? N N  In the past six months, have you accidently leaked urine? N N  Do you have problems with loss of bowel control? N N  Managing your Medications? N N  Managing your Finances? N N  Housekeeping or managing your Housekeeping? N N    Patient Care Team: Erasmo Downer, MD as PCP - General (Family Medicine) Stanton Kidney, MD as Consulting Physician (Gastroenterology) Irene Limbo., MD as Consulting Physician (Ophthalmology) Beryle Flock Marzella Schlein, MD as Consulting Physician (Family Medicine)  Indicate any recent Medical Services you may have received from other than [facility] providers in the past year (date may be approximate).     Assessment:   This is a routine wellness examination for Hoberg.  Hearing/Vision screen Hearing Screening - Comments:: Denies hearing difficulties. No hearing aids.  Vision Screening - Comments:: Wears rx glasses - up to date with routine eye exams with Dr. Hulen Luster     Goals Addressed             This Visit's Progress    04/01/2024: Improve my diet and get down to 160 pounds.         Depression Screen     04/01/2024    1:11 PM 02/06/2024    1:05 PM 08/05/2023    2:27 PM 02/20/2023    2:39 PM 11/08/2022    8:19 AM 07/19/2022    8:54 AM 02/19/2022    3:48 PM  PHQ 2/9 Scores  PHQ - 2 Score 0 0 0 0 0 0 0  PHQ- 9 Score 0 2 1  2       Fall Risk     04/01/2024    1:20 PM 03/31/2024    8:56 AM 02/06/2024    1:05 PM 08/05/2023    2:27 PM 02/20/2023  2:35 PM  Fall Risk   Falls in the past year? 0 0 0 1 0  Number falls in past  yr: 0  0 0 0  Injury with Fall? 0  0 0 0  Risk for fall due to : No Fall Risks  No Fall Risks History of fall(s) No Fall Risks  Follow up Falls prevention discussed;Falls evaluation completed  Falls evaluation completed Falls evaluation completed Education provided;Falls prevention discussed    MEDICARE RISK AT HOME:  Medicare Risk at Home Any stairs in or around the home?: Yes If so, are there any without handrails?: No Home free of loose throw rugs in walkways, pet beds, electrical cords, etc?: Yes Adequate lighting in your home to reduce risk of falls?: Yes Life alert?: No Use of a cane, walker or w/c?: No Grab bars in the bathroom?: No Shower chair or bench in shower?: Yes Elevated toilet seat or a handicapped toilet?: Yes  TIMED UP AND GO:  Was the test performed?  No  Cognitive Function: 6CIT completed    04/01/2024    1:20 PM  MMSE - Mini Mental State Exam  Not completed: Unable to complete        04/01/2024    1:20 PM 02/20/2023    2:49 PM  6CIT Screen  What Year? 0 points 0 points  What month? 0 points 0 points  What time? 0 points 0 points  Count back from 20 0 points 0 points  Months in reverse 0 points 0 points  Repeat phrase 0 points 0 points  Total Score 0 points 0 points    Immunizations Immunization History  Administered Date(s) Administered   Fluad Quad(high Dose 65+) 11/08/2022   Influenza,inj,Quad PF,6+ Mos 11/05/2019   Influenza-Unspecified 09/27/2018, 11/24/2020, 10/11/2021, 10/07/2023   PFIZER Comirnaty(Gray Top)Covid-19 Tri-Sucrose Vaccine 10/20/2020   PFIZER(Purple Top)SARS-COV-2 Vaccination 02/26/2020, 03/18/2020, 04/27/2020, 05/18/2020   PNEUMOCOCCAL CONJUGATE-20 01/18/2022   Pfizer Covid-19 Vaccine Bivalent Booster 66yrs & up 11/14/2021   Pneumococcal Conjugate-13 08/24/2020   Td 09/30/2018   Tdap 09/30/2018   Zoster, Live 06/18/2011    Screening Tests Health Maintenance  Topic Date Due   Zoster Vaccines- Shingrix (1 of 2)  08/22/2005   COVID-19 Vaccine (8 - 2024-25 season) 08/25/2023   INFLUENZA VACCINE  07/24/2024   DEXA SCAN  02/23/2025   Medicare Annual Wellness (AWV)  04/01/2025   MAMMOGRAM  05/02/2025   DTaP/Tdap/Td (3 - Td or Tdap) 09/30/2028   Colonoscopy  05/06/2030   Pneumonia Vaccine 69+ Years old  Completed   Hepatitis C Screening  Completed   HPV VACCINES  Aged Out    Health Maintenance  Health Maintenance Due  Topic Date Due   Zoster Vaccines- Shingrix (1 of 2) 08/22/2005   COVID-19 Vaccine (8 - 2024-25 season) 08/25/2023   Health Maintenance Items Addressed: Yes Patient has been advised that she is due for Shingrix and Covid-19 vaccines.  Additional Screening:  Vision Screening: Recommended annual ophthalmology exams for early detection of glaucoma and other disorders of the eye.  Dental Screening: Recommended annual dental exams for proper oral hygiene  Community Resource Referral / Chronic Care Management: CRR required this visit?  No   CCM required this visit?  No     Plan:     I have personally reviewed and noted the following in the patient's chart:   Medical and social history Use of alcohol, tobacco or illicit drugs  Current medications and supplements including opioid prescriptions. Patient is not currently taking  opioid prescriptions. Functional ability and status Nutritional status Physical activity Advanced directives List of other physicians Hospitalizations, surgeries, and ER visits in previous 12 months Vitals Screenings to include cognitive, depression, and falls Referrals and appointments  In addition, I have reviewed and discussed with patient certain preventive protocols, quality metrics, and best practice recommendations. A written personalized care plan for preventive services as well as general preventive health recommendations were provided to patient.     Mickeal Needy, LPN   4/0/9811   After Visit Summary: (MyChart) Due to this being  a telephonic visit, the after visit summary with patients personalized plan was offered to patient via MyChart   Notes: Nothing significant to report at this time.

## 2024-04-02 ENCOUNTER — Encounter: Payer: Self-pay | Admitting: Family Medicine

## 2024-04-11 ENCOUNTER — Ambulatory Visit
Admission: RE | Admit: 2024-04-11 | Discharge: 2024-04-11 | Disposition: A | Discharge status: Home / Self Care | Source: Ambulatory Visit | Attending: Emergency Medicine | Admitting: Emergency Medicine

## 2024-04-11 VITALS — BP 117/72 | HR 80 | Temp 99.4°F | Resp 20 | Ht 64.5 in | Wt 185.0 lb

## 2024-04-11 DIAGNOSIS — J069 Acute upper respiratory infection, unspecified: Secondary | ICD-10-CM

## 2024-04-11 LAB — SARS CORONAVIRUS 2 BY RT PCR: SARS Coronavirus 2 by RT PCR: NEGATIVE

## 2024-04-11 MED ORDER — IPRATROPIUM BROMIDE 0.06 % NA SOLN: 2.0000 | Freq: Four times a day (QID) | NASAL | 12 refills | Status: DC

## 2024-04-11 MED ORDER — PROMETHAZINE-DM 6.25-15 MG/5ML PO SYRP: 5.0000 mL | ORAL_SOLUTION | Freq: Four times a day (QID) | ORAL | 0 refills | Status: DC | PRN

## 2024-04-11 MED ORDER — BENZONATATE 100 MG PO CAPS: 200.0000 mg | ORAL_CAPSULE | Freq: Three times a day (TID) | ORAL | 0 refills | Status: DC

## 2024-04-11 NOTE — ED Triage Notes (Signed)
 Patient states 3-4 days of coughing, chest and back hurt from coughing.  Taking OTC nighttime cough med w/ no relief.  Concerned that her granddaughter she cares for tested + Human MetaPneumoVirus

## 2024-04-11 NOTE — ED Provider Notes (Signed)
 [facility]    CSN: [id number] Arrival date & time: 04/11/24  1241      History   Chief Complaint Chief Complaint  Patient presents with   Cough    Entered by patient    HPI [name] [name] is a 68 y.o. female.   HPI  68 year old female with past medical history significant for primary hypertension, kidney stones, recurrent MDD, PSVT, prediabetes, psychophysiological insomnia, and high triglycerides presents for evaluation of 3 to 4 days with respiratory symptoms that include mild runny nose and nasal congestion, irritated throat that she attributes to coughing, nonproductive cough, and bodyaches.  She denies any fever, ear pain, shortness breath, or wheezing.  Her granddaughter recently tested positive for human metapneumovirus.  Past Medical History:  Diagnosis Date   Anxiety    not an issue anymore. this was due to broken ankle   Arthritis    BACK   Cataract    Complication of anesthesia    SLOW TO WAKE UP X 1 WITH ANKLE SURGERY   Depression    treated with zoloft  no longer see psychiatrist   GERD (gastroesophageal reflux disease)    OCC-NO MEDS   History of kidney stones    Hypertension 02/19/2017   Hypertriglyceridemia 06/25/2016   Osteopenia 06/25/2016   PONV (postoperative nausea and vomiting)     Patient Active Problem List   Diagnosis Date Noted   Kidney stones 02/19/2024   Recurrent major depressive disorder, in full remission (HCC) 07/19/2022   Primary hypertension 07/19/2022   PSVT (paroxysmal supraventricular tachycardia) (HCC) 06/16/2021   Prediabetes 04/01/2019   Hypertriglyceridemia 06/25/2016   Psychophysiological insomnia 03/28/2007    Past Surgical History:  Procedure Laterality Date   ANKLE FRACTURE SURGERY Left 2012   CHOLECYSTECTOMY     COLONOSCOPY WITH PROPOFOL  N/A 05/05/2020   Procedure: COLONOSCOPY WITH PROPOFOL ;  Surgeon: Toledo, Alphonsus Jeans, MD;  Location: [facility];  Service: Gastroenterology;  Laterality: N/A;    CYSTOSCOPY/URETEROSCOPY/HOLMIUM LASER/STENT PLACEMENT Left 09/05/2018   Procedure: CYSTOSCOPY/URETEROSCOPY/HOLMIUM LASER/STENT PLACEMENT;  Surgeon: Lawerence Pressman, MD;  Location: [facility];  Service: Urology;  Laterality: Left;   EXTRACORPOREAL SHOCK WAVE LITHOTRIPSY Left 08/21/2018   Procedure: EXTRACORPOREAL SHOCK WAVE LITHOTRIPSY (ESWL);  Surgeon: Geraline Knapp, MD;  Location: [facility];  Service: Urology;  Laterality: Left;   FOOT SURGERY Right    neuroma removed   FRACTURE SURGERY     LITHOTRIPSY  1990'S   TUBAL LIGATION     VEIN LIGATION AND STRIPPING Left     OB History   No obstetric history on file.      Home Medications    Prior to Admission medications   Medication Sig Start Date End Date Taking? Authorizing Provider  benzonatate  (TESSALON ) 100 MG capsule Take 2 capsules (200 mg total) by mouth every 8 (eight) hours. 04/11/24  Yes Kent Pear, NP  ipratropium (ATROVENT ) 0.06 % nasal spray Place 2 sprays into both nostrils 4 (four) times daily. 04/11/24  Yes Kent Pear, NP  promethazine -dextromethorphan  (PROMETHAZINE -DM) 6.25-15 MG/5ML syrup Take 5 mLs by mouth 4 (four) times daily as needed. 04/11/24  Yes Kent Pear, NP  diltiazem  (CARDIZEM ) 30 MG tablet Take 1 tablet (30 mg total) by mouth 3 (three) times daily as needed. For fast heart rate 01/19/22   Gollan, Timothy J, MD  hydrochlorothiazide  (HYDRODIURIL ) 12.5 MG tablet TAKE ONE TABLET EVERY DAY WITH LOSARTAN  50MG  02/06/24   Mazie Speed, MD  Ivermectin  1 % CREA Apply topically to affected area  daily 01/21/23   Bacigalupo, Angela M, MD  ketoconazole  (NIZORAL ) 2 % shampoo Apply topically 3 (three) times a week. 01/21/23   Mazie Speed, MD  losartan  (COZAAR ) 50 MG tablet Take 1 tablet (50 mg total) by mouth daily. 02/06/24   Bacigalupo, Angela M, MD  Multiple Vitamin (MULTIVITAMIN) capsule Take 1 capsule by mouth daily.     [provider]  PFIZER COVID-19 VAC BIVALENT injection  11/14/21    [provider]  rosuvastatin  (CRESTOR ) 5 MG tablet Take 1 tablet (5 mg total) by mouth daily. 02/06/24   Bacigalupo, Angela M, MD  sertraline  (ZOLOFT ) 50 MG tablet Take 1 tablet (50 mg total) by mouth every morning. 02/06/24   Bacigalupo, Angela M, MD  traZODone  (DESYREL ) 100 MG tablet Take 1 tablet (100 mg total) by mouth at bedtime as needed for sleep. 02/06/24   Bacigalupo, Stan Eans, MD    Family History Family History  Problem Relation Age of Onset   Breast cancer Maternal Aunt 54   Kidney disease Mother    Alzheimer's disease Mother    Varicose Veins Mother    Cancer Father     Social History Social History   Tobacco Use   Smoking status: Never   Smokeless tobacco: Never  Vaping Use   Vaping status: Never Used  Substance Use Topics   Alcohol use: No   Drug use: No     Allergies   Oxycodone-acetaminophen    Review of Systems Review of Systems  Constitutional:  Negative for fever.  HENT:  Positive for congestion, rhinorrhea and sore throat. Negative for ear pain.   Respiratory:  Positive for cough. Negative for shortness of breath and wheezing.   Musculoskeletal:  Positive for arthralgias and myalgias.     Physical Exam Triage Vital Signs ED Triage Vitals  Encounter Vitals Group     BP      Systolic BP Percentile      Diastolic BP Percentile      Pulse      Resp      Temp      Temp src      SpO2      Weight      Height      Head Circumference      Peak Flow      Pain Score      Pain Loc      Pain Education      Exclude from Growth Chart    No data found.  Updated Vital Signs BP 117/72 (BP Location: Right Arm)   Pulse 80   Temp 99.4 F (37.4 C) (Oral)   Resp 20   Ht 5' 4.5" (1.638 m)   Wt 185 lb (83.9 kg)   SpO2 96%   BMI 31.26 kg/m   Visual Acuity Right Eye Distance:   Left Eye Distance:   Bilateral Distance:    Right Eye Near:   Left Eye Near:    Bilateral Near:     Physical Exam Vitals and nursing note reviewed.   Constitutional:      Appearance: Normal appearance. She is not ill-appearing.  HENT:     Head: Normocephalic and atraumatic.     Right Ear: Tympanic membrane, ear canal and external ear normal. There is no impacted cerumen.     Left Ear: Tympanic membrane, ear canal and external ear normal. There is no impacted cerumen.     Nose: Congestion and rhinorrhea present.     Comments: Nasal  mucosa is edematous and erythematous with scant clear discharge in both nares.    Mouth/Throat:     Mouth: Mucous membranes are moist.     Pharynx: Oropharynx is clear. Posterior oropharyngeal erythema present. No oropharyngeal exudate.     Comments: Mild erythema to posterior oropharynx with clear postnasal drip. Cardiovascular:     Rate and Rhythm: Normal rate and regular rhythm.     Pulses: Normal pulses.     Heart sounds: Normal heart sounds. No murmur heard.    No friction rub. No gallop.  Pulmonary:     Effort: Pulmonary effort is normal.     Breath sounds: Wheezing present. No rhonchi or rales.     Comments: Expiratory wheezing on the left.  Lungs are clear on the right. Musculoskeletal:     Cervical back: Normal range of motion and neck supple. No tenderness.  Lymphadenopathy:     Cervical: No cervical adenopathy.  Skin:    General: Skin is warm and dry.     Capillary Refill: Capillary refill takes less than 2 seconds.     Findings: No rash.  Neurological:     General: No focal deficit present.     Mental Status: She is alert and oriented to person, place, and time.      UC Treatments / Results  Labs (all labs ordered are listed, but only abnormal results are displayed) Labs Reviewed  SARS CORONAVIRUS 2 BY RT PCR    EKG   Radiology No results found.  Procedures Procedures (including critical care time)  Medications Ordered in UC Medications - No data to display  Initial Impression / Assessment and Plan / UC Course  I have reviewed the triage vital signs and the nursing  notes.  Pertinent labs & imaging results that were available during my care of the patient were reviewed by me and considered in my medical decision making (see chart for details).   Patient is a pleasant, nontoxic-appearing 68 year old female presenting for evaluation of respiratory symptoms as outlined in HPI above.  She has been exposed to human metapneumovirus and that is what she is concerned she may have.  Her physical exam does reveal inflammation of her upper respiratory tract as evidenced by inflamed nasal mucosa with clear rhinorrhea.  Oropharyngeal exam does reveal mild erythema to the posterior oropharynx with clear postnasal drip.  No cervical lymphadenopathy present cardiopulmonary exam reveals expiratory wheezing on the left but her lungs are clear on the right.  Differential diagnose include COVID, influenza, and human metapneumovirus, viral respiratory illness.  Given that she is on day 3-4 symptoms I will not test her for influenza and I explained to the patient that we do not do the respiratory virus panel here at urgent care as it will not change our management.  I will order a COVID PCR however.  COVID PCR is negative.  I will discharge patient with a diagnosis of viral URI with a cough with prescription for Atrovent  nasal spray to help with nasal congestion and Tessalon  Perles and Promethazine  DM cough syrup for cough and congestion.  Also an albuterol inhaler and spacer that she can do 1 to 2 puffs every 4-6 hours as needed for any shortness of breath or wheezing.   Final Clinical Impressions(s) / UC Diagnoses   Final diagnoses:  Viral URI with cough     Discharge Instructions      Your COVID test was negative.  Based upon your exam I do believe you have a  viral respiratory infection.  It may be the human metapneumovirus that your granddaughter has but there is no treatment for that other than supportive care.  Use over-the-counter Tylenol  and/or ibuprofen  according the  pack instructions as needed for any body aches or if you develop a fever.  Use the Atrovent  nasal spray, 2 squirts in each nostril every 6 hours, as needed for runny nose and postnasal drip.  Use the Tessalon  Perles every 8 hours during the day.  Take them with a small sip of water.  They may give you some numbness to the base of your tongue or a metallic taste in your mouth, this is normal.  Use the Promethazine  DM cough syrup at bedtime for cough and congestion.  It will make you drowsy so do not take it during the day.  Return for reevaluation or see your primary care provider for any new or worsening symptoms.      ED Prescriptions     Medication Sig Dispense Auth. Provider   benzonatate  (TESSALON ) 100 MG capsule Take 2 capsules (200 mg total) by mouth every 8 (eight) hours. 21 capsule Kent Pear, NP   ipratropium (ATROVENT ) 0.06 % nasal spray Place 2 sprays into both nostrils 4 (four) times daily. 15 mL Kent Pear, NP   promethazine -dextromethorphan  (PROMETHAZINE -DM) 6.25-15 MG/5ML syrup Take 5 mLs by mouth 4 (four) times daily as needed. 118 mL Kent Pear, NP      PDMP not reviewed this encounter.   Kent Pear, NP 04/11/24 1343

## 2024-04-11 NOTE — Discharge Instructions (Addendum)
 Your COVID test was negative.  Based upon your exam I do believe you have a viral respiratory infection.  It may be the human metapneumovirus that your granddaughter has but there is no treatment for that other than supportive care.  Use over-the-counter Tylenol  and/or ibuprofen  according the pack instructions as needed for any body aches or if you develop a fever.  Use the Atrovent  nasal spray, 2 squirts in each nostril every 6 hours, as needed for runny nose and postnasal drip.  Use the Tessalon  Perles every 8 hours during the day.  Take them with a small sip of water.  They may give you some numbness to the base of your tongue or a metallic taste in your mouth, this is normal.  Use the Promethazine  DM cough syrup at bedtime for cough and congestion.  It will make you drowsy so do not take it during the day.  Return for reevaluation or see your primary care provider for any new or worsening symptoms.

## 2024-04-27 ENCOUNTER — Encounter: Payer: Self-pay | Admitting: Family Medicine

## 2024-04-27 DIAGNOSIS — Z1231 Encounter for screening mammogram for malignant neoplasm of breast: Secondary | ICD-10-CM

## 2024-04-28 ENCOUNTER — Ambulatory Visit: Payer: Self-pay

## 2024-04-28 NOTE — Telephone Encounter (Signed)
 Doesn't need diagnostic, just annual 3d screening mammo. Ok to send order to [facility] and let her know to call and schedule

## 2024-04-28 NOTE — Telephone Encounter (Signed)
  Chief Complaint: productive cough Symptoms: headache, productive cough with tan/brown mucus, bilateral rib pain Frequency: x 4 days Pertinent Negatives: Patient denies fever, SOB,  Disposition: [] ED /[] Urgent Care (no appt availability in office) / [x] Appointment(In office/virtual)/ []  [facility]/ [] Home Care/ [] Refused Recommended Disposition /[] [facility]/ []  Follow-up with PCP Additional Notes: Patient states this feels similar to when she has had bronchitis. Offered appts on Thursday (soonest available) with PCP and patient states she is going out of town that day, patient agreeable to appt tomorrow with PA [employer].  Copied from CRM [id number]. Topic: Clinical - Red Word Triage >> Apr 28, 2024  8:25 AM Antwanette L wrote: Red Word that prompted transfer to Nurse Triage: Patient is having severe rib pain when she coughs. The patient is coughing up a thick tan mucus Reason for Disposition  Coughing up rusty-colored (reddish-brown) sputum  Answer Assessment - Initial Assessment Questions 1. ONSET: "When did the cough begin?"      Saturday. 2. SEVERITY: "How bad is the cough today?"      She states there are bad coughing spells. 3. SPUTUM: "Describe the color of your sputum" (none, dry cough; clear, white, yellow, green)     Thick, tan/brown. 4. HEMOPTYSIS: "Are you coughing up any blood?" If so ask: "How much?" (flecks, streaks, tablespoons, etc.)     Denies. 5. DIFFICULTY BREATHING: "Are you having difficulty breathing?" If Yes, ask: "How bad is it?" (e.g., mild, moderate, severe)    - MILD: No SOB at rest, mild SOB with walking, speaks normally in sentences, can lie down, no retractions, pulse < 100.    - MODERATE: SOB at rest, SOB with minimal exertion and prefers to sit, cannot lie down flat, speaks in phrases, mild retractions, audible wheezing, pulse 100-120.    - SEVERE: Very SOB at rest, speaks in single words, struggling to breathe, sitting hunched  forward, retractions, pulse > 120      Denies. 6. FEVER: "Do you have a fever?" If Yes, ask: "What is your temperature, how was it measured, and when did it start?"     Denies. 7. CARDIAC HISTORY: "Do you have any history of heart disease?" (e.g., heart attack, congestive heart failure)      PSVT. 8. LUNG HISTORY: "Do you have any history of lung disease?"  (e.g., pulmonary embolus, asthma, emphysema)     Denies. 9. PE RISK FACTORS: "Do you have a history of blood clots?" (or: recent major surgery, recent prolonged travel, bedridden)     Denies. 10. OTHER SYMPTOMS: "Do you have any other symptoms?" (e.g., runny nose, wheezing, chest pain)       Reflux after eating a manwhich Saturday, rib pain from coughing 11. PREGNANCY: "Is there any chance you are pregnant?" "When was your last menstrual period?"       N/A. 12. TRAVEL: "Have you traveled out of the country in the last month?" (e.g., travel history, exposures)       Granddaughter gave her human metapneumovirus  Protocols used: Cough - Acute Productive-A-AH

## 2024-04-29 ENCOUNTER — Ambulatory Visit: Admitting: Physician Assistant

## 2024-04-29 ENCOUNTER — Encounter: Payer: Self-pay | Admitting: Physician Assistant

## 2024-04-29 VITALS — BP 112/72 | HR 84 | Temp 98.4°F | Ht 64.0 in | Wt 184.7 lb

## 2024-04-29 DIAGNOSIS — R053 Chronic cough: Secondary | ICD-10-CM | POA: Diagnosis not present

## 2024-04-29 DIAGNOSIS — R0981 Nasal congestion: Secondary | ICD-10-CM

## 2024-04-29 DIAGNOSIS — E66811 Obesity, class 1: Secondary | ICD-10-CM

## 2024-04-29 MED ORDER — FLUTICASONE PROPIONATE 50 MCG/ACT NA SUSP
2.0000 | Freq: Every day | NASAL | 6 refills | Status: DC
Start: 2024-04-29 — End: 2024-09-07

## 2024-04-29 MED ORDER — PANTOPRAZOLE SODIUM 20 MG PO TBEC
20.0000 mg | DELAYED_RELEASE_TABLET | Freq: Two times a day (BID) | ORAL | 1 refills | Status: DC
Start: 2024-04-29 — End: 2024-09-07

## 2024-04-29 NOTE — Progress Notes (Signed)
 Established patient visit  Patient: [name] [name]   DOB: August 25, 1956   68 y.o. Female  MRN: [id number] Visit Date: 04/29/2024  Today's healthcare provider: Blane Bunting, PA-C   Chief Complaint  Patient presents with   Cough    Pt is present due to productive cough with tan/brown mucus associated with bilateral rib pain x 5 days. Patient denies fever and SOB. Patient states this feels similar to when she has had bronchitis. She reports having respiratory symptoms since March that have not fully resolved. Patient reports she has taken mucinex  and motrin  as well as a cough medicine that was previously prescribed.   Subjective      Discussed the use of AI scribe software for clinical note transcription with the patient, who gave verbal consent to proceed.  History of Present Illness [name] FRANCE "Fredrik Jensen" is a 68 year old female with PSVT who presents with a productive cough.  She has had a productive cough with tan to brown, thick, yellowish sputum for four days, causing gagging and coughing without hemoptysis. The cough is associated with bilateral rib pain during coughing spells. She denies fever or dyspnea. Similar symptoms have occurred during allergy season, previously treated with antibiotics. A recent severe acid reflux episode at 3 AM led to prolonged coughing, with ear burning and breathlessness, following the consumption of a sloppy joe sandwich. She does not regularly take reflux medication. Her past medical history includes PSVT and a cardiac history. She denies lung problems or pulmonary embolus. She is taking regular Mucinex  for her cough and received Benadryl  in the emergency room, which was ineffective.       04/01/2024    1:11 PM 02/06/2024    1:05 PM 08/05/2023    2:27 PM  Depression screen PHQ 2/9  Decreased Interest 0 0 0  Down, Depressed, Hopeless 0 0 0  PHQ - 2 Score 0 0 0  Altered sleeping 0 1 0  Tired, decreased energy 0 1 1  Change in appetite 0 0 0   Feeling bad or failure about yourself  0 0 0  Trouble concentrating 0 0 0  Moving slowly or fidgety/restless   0  Suicidal thoughts 0 0 0  PHQ-9 Score 0 2 1  Difficult doing work/chores Not difficult at all Not difficult at all Not difficult at all      02/06/2024    1:05 PM  GAD 7 : Generalized Anxiety Score  Nervous, Anxious, on Edge 0  Control/stop worrying 0  Worry too much - different things 0  Trouble relaxing 0  Restless 0  Easily annoyed or irritable 0  Afraid - awful might happen 0  Total GAD 7 Score 0  Anxiety Difficulty Not difficult at all    Medications: Outpatient Medications Prior to Visit  Medication Sig   diltiazem  (CARDIZEM ) 30 MG tablet Take 1 tablet (30 mg total) by mouth 3 (three) times daily as needed. For fast heart rate   hydrochlorothiazide  (HYDRODIURIL ) 12.5 MG tablet TAKE ONE TABLET EVERY DAY WITH LOSARTAN  50MG    Ivermectin  1 % CREA Apply topically to affected area daily   ketoconazole  (NIZORAL ) 2 % shampoo Apply topically 3 (three) times a week.   losartan  (COZAAR ) 50 MG tablet Take 1 tablet (50 mg total) by mouth daily.   Multiple Vitamin (MULTIVITAMIN) capsule Take 1 capsule by mouth daily.    sertraline  (ZOLOFT ) 50 MG tablet Take 1 tablet (50 mg total) by mouth every morning.   traZODone  (DESYREL ) 100 MG  tablet Take 1 tablet (100 mg total) by mouth at bedtime as needed for sleep.   benzonatate  (TESSALON ) 100 MG capsule Take 2 capsules (200 mg total) by mouth every 8 (eight) hours. (Patient not taking: Reported on 04/29/2024)   ipratropium (ATROVENT ) 0.06 % nasal spray Place 2 sprays into both nostrils 4 (four) times daily. (Patient not taking: Reported on 04/29/2024)   PFIZER COVID-19 VAC BIVALENT injection    promethazine -dextromethorphan  (PROMETHAZINE -DM) 6.25-15 MG/5ML syrup Take 5 mLs by mouth 4 (four) times daily as needed. (Patient not taking: Reported on 04/29/2024)   rosuvastatin  (CRESTOR ) 5 MG tablet Take 1 tablet (5 mg total) by mouth daily.  (Patient not taking: Reported on 04/29/2024)   No facility-administered medications prior to visit.    Review of Systems  All other systems reviewed and are negative.  All negative Except see HPI       Objective    BP 112/72 (BP Location: Left Arm, Patient Position: Sitting, Cuff Size: Normal)   Pulse 84   Temp 98.4 F (36.9 C) (Oral)   Ht 5\' 4"  (1.626 m)   Wt 184 lb 11.2 oz (83.8 kg)   SpO2 96%   BMI 31.70 kg/m     Physical Exam Vitals reviewed.  Constitutional:      Appearance: She is normal weight.  HENT:     Head: Normocephalic and atraumatic.     Right Ear: Ear canal and external ear normal.     Left Ear: Ear canal and external ear normal.     Nose: Congestion and rhinorrhea present.     Mouth/Throat:     Pharynx: Posterior oropharyngeal erythema present.     Comments: Postnasal drainage noted Eyes:     General: No scleral icterus.       Right eye: No discharge.        Left eye: No discharge.     Extraocular Movements: Extraocular movements intact.     Pupils: Pupils are equal, round, and reactive to light.  Cardiovascular:     Rate and Rhythm: Normal rate and regular rhythm.  Pulmonary:     Effort: Pulmonary effort is normal.     Breath sounds: Normal breath sounds.  Abdominal:     General: Abdomen is flat. Bowel sounds are normal.     Palpations: Abdomen is soft.  Lymphadenopathy:     Cervical: No cervical adenopathy.  Neurological:     Mental Status: She is alert.      No results found for any visits on 04/29/24.      Assessment & Plan Cough with congestion X 1 mo Cough likely due to viral infection and post-nasal drip, possibly exacerbated by acid reflux. No fever or shortness of breath. Lung sounds clear. Normal vitals today - Continue Mucinex . - Use nasal saline rinse or spray. - Start Flonase . - Consider ipratropium nasal spray if cough persists. - Take Zyrtec or another antihistamine during air travel. Continue symptomatic treatment  with plenty of water, air humidifier, tenting, see the above If symptoms worsen RTC  Acid reflux Recent episode. Acid reflux likely contributing to chronic cough. Severe reflux at night. Protonix covered by insurance. - Prescribe Protonix for a trial treatment - Advise lifestyle modifications: avoid acidic and spicy foods, do not recline after eating, elevate head of bed. - Monitor symptoms and adjust Protonix dosage as needed. Will follow-up with primary  In the setting of obesity Obesity Chronic and stable Weight loss of 5% of pt's current weight via healthy diet  and daily exercise encouraged. Will follow-up  Chronic cough (Primary) - pantoprazole (PROTONIX) 20 MG tablet; Take 1 tablet (20 mg total) by mouth 2 (two) times daily.  Dispense: 60 tablet; Refill: 1  Nasal congestion - fluticasone  (FLONASE ) 50 MCG/ACT nasal spray; Place 2 sprays into both nostrils daily.  Dispense: 16 g; Refill: 6   No orders of the defined types were placed in this encounter.   No follow-ups on file.   The patient was advised to call back or seek an in-person evaluation if the symptoms worsen or if the condition fails to improve as anticipated.  I discussed the assessment and treatment plan with the patient. The patient was provided an opportunity to ask questions and all were answered. The patient agreed with the plan and demonstrated an understanding of the instructions.  I, Jaxn Chiquito, PA-C have reviewed all documentation for this visit. The documentation on 04/29/2024  for the exam, diagnosis, procedures, and orders are all accurate and complete.  Blane Bunting, [facility], [facility] [phone] (phone) [phone] (fax)  [facility]

## 2024-04-30 ENCOUNTER — Telehealth: Payer: Self-pay

## 2024-04-30 ENCOUNTER — Other Ambulatory Visit (HOSPITAL_COMMUNITY): Payer: Self-pay

## 2024-04-30 NOTE — Telephone Encounter (Signed)
 Pharmacy Patient Advocate Encounter  Received notification from [facility] Medicare that Prior Authorization for Pantoprazole Sodium 20MG  dr tablets has been APPROVED from 04/30/24 to 04/30/25. Ran test claim, Copay is $0. This test claim was processed through [facility]- copay amounts may vary at other pharmacies due to pharmacy/plan contracts, or as the patient moves through the different stages of their insurance plan.   PA #/Case ID/Reference #: [id number]

## 2024-04-30 NOTE — Telephone Encounter (Signed)
 Pharmacy Patient Advocate Encounter   Received notification from Onbase that prior authorization for Pantoprazole Sodium 20MG  dr tablets is required/requested.   Insurance verification completed.   The patient is insured through [facility] .   Per test claim: PA required; PA submitted to above mentioned insurance via CoverMyMeds Key/confirmation #/EOC [id number] Status is pending

## 2024-06-02 ENCOUNTER — Ambulatory Visit
Admission: RE | Admit: 2024-06-02 | Discharge: 2024-06-02 | Disposition: A | Discharge status: Home / Self Care | Source: Ambulatory Visit | Attending: Family Medicine | Admitting: Family Medicine

## 2024-06-02 DIAGNOSIS — Z1231 Encounter for screening mammogram for malignant neoplasm of breast: Secondary | ICD-10-CM | POA: Insufficient documentation

## 2024-06-04 ENCOUNTER — Telehealth: Payer: Self-pay

## 2024-06-04 ENCOUNTER — Ambulatory Visit: Payer: Self-pay | Admitting: Family Medicine

## 2024-06-04 NOTE — Telephone Encounter (Signed)
 Patient advised   Copied from CRM [id number]. Topic: Clinical - Prescription Issue >> Apr 30, 2024  2:20 PM Carlatta H wrote: Reason for CRM: Quantity limit exception for pantoprazole  (PROTONIX ) 20 MG tablet [[id number]]//Request was sent in today and is approved from 60 per 30days//Approval 04/30/24 - 04/30/25

## 2024-07-05 ENCOUNTER — Ambulatory Visit
Admission: RE | Admit: 2024-07-05 | Discharge: 2024-07-05 | Disposition: A | Discharge status: Home / Self Care | Attending: Emergency Medicine

## 2024-07-05 VITALS — BP 117/77 | HR 69 | Temp 98.1°F | Resp 18

## 2024-07-05 DIAGNOSIS — R300 Dysuria: Secondary | ICD-10-CM | POA: Diagnosis present

## 2024-07-05 LAB — POCT URINALYSIS DIP (MANUAL ENTRY)
Bilirubin, UA: NEGATIVE
Glucose, UA: NEGATIVE mg/dL
Nitrite, UA: NEGATIVE
Spec Grav, UA: 1.015 (ref 1.010–1.025)
Urobilinogen, UA: 0.2 U/dL
pH, UA: 6 (ref 5.0–8.0)

## 2024-07-05 MED ORDER — CEPHALEXIN 500 MG PO CAPS: 500.0000 mg | ORAL_CAPSULE | Freq: Three times a day (TID) | ORAL | 0 refills | Status: AC

## 2024-07-05 NOTE — Discharge Instructions (Addendum)
 Take the antibiotic as directed.  The urine culture is pending.  We will call you if it shows the need to change or discontinue your antibiotic.    Follow up with your primary care provider if your symptoms are not improving.

## 2024-07-05 NOTE — ED Provider Notes (Signed)
 [name] [name]    CSN: [id number] Arrival date & time: 07/05/24  1243      History   Chief Complaint Chief Complaint  Patient presents with   Dysuria    HPI [name] [name] is a 68 y.o. female.  Patient presents with 1 day history of dysuria.  No fever, chills, abdominal pain, hematuria, flank pain.  No OTC medication taken today.  The history is provided by the patient and medical records.    Past Medical History:  Diagnosis Date   Anxiety    not an issue anymore. this was due to broken ankle   Arthritis    BACK   Cataract    Complication of anesthesia    SLOW TO WAKE UP X 1 WITH ANKLE SURGERY   Depression    treated with zoloft  no longer see psychiatrist   GERD (gastroesophageal reflux disease)    OCC-NO MEDS   History of kidney stones    Hypertension 02/19/2017   Hypertriglyceridemia 06/25/2016   Osteopenia 06/25/2016   PONV (postoperative nausea and vomiting)     Patient Active Problem List   Diagnosis Date Noted   Kidney stones 02/19/2024   Recurrent major depressive disorder, in full remission (HCC) 07/19/2022   Primary hypertension 07/19/2022   PSVT (paroxysmal supraventricular tachycardia) (HCC) 06/16/2021   Prediabetes 04/01/2019   Hypertriglyceridemia 06/25/2016   Psychophysiological insomnia 03/28/2007    Past Surgical History:  Procedure Laterality Date   ANKLE FRACTURE SURGERY Left 2012   CHOLECYSTECTOMY     COLONOSCOPY WITH PROPOFOL  N/A 05/05/2020   Procedure: COLONOSCOPY WITH PROPOFOL ;  Surgeon: Toledo, Ladell POUR, MD;  Location: [facility];  Service: Gastroenterology;  Laterality: N/A;   CYSTOSCOPY/URETEROSCOPY/HOLMIUM LASER/STENT PLACEMENT Left 09/05/2018   Procedure: CYSTOSCOPY/URETEROSCOPY/HOLMIUM LASER/STENT PLACEMENT;  Surgeon: Francisca Redell BROCKS, MD;  Location: [facility];  Service: Urology;  Laterality: Left;   EXTRACORPOREAL SHOCK WAVE LITHOTRIPSY Left 08/21/2018   Procedure: EXTRACORPOREAL SHOCK WAVE LITHOTRIPSY (ESWL);   Surgeon: Twylla Glendia BROCKS, MD;  Location: [facility];  Service: Urology;  Laterality: Left;   FOOT SURGERY Right    neuroma removed   FRACTURE SURGERY     LITHOTRIPSY  1990'S   TUBAL LIGATION     VEIN LIGATION AND STRIPPING Left     OB History   No obstetric history on file.      Home Medications    Prior to Admission medications   Medication Sig Start Date End Date Taking? Authorizing Provider  cephALEXin  (KEFLEX ) 500 MG capsule Take 1 capsule (500 mg total) by mouth 3 (three) times daily for 5 days. 07/05/24 07/10/24 Yes Corlis Burnard DEL, NP  diltiazem  (CARDIZEM ) 30 MG tablet Take 1 tablet (30 mg total) by mouth 3 (three) times daily as needed. For fast heart rate 01/19/22   Gollan, Timothy J, MD  fluticasone  (FLONASE ) 50 MCG/ACT nasal spray Place 2 sprays into both nostrils daily. 04/29/24   Ostwalt, Janna, PA-C  hydrochlorothiazide  (HYDRODIURIL ) 12.5 MG tablet TAKE ONE TABLET EVERY DAY WITH LOSARTAN  50MG  02/06/24   Bacigalupo, Angela M, MD  ipratropium (ATROVENT ) 0.06 % nasal spray Place 2 sprays into both nostrils 4 (four) times daily. Patient not taking: Reported on 04/29/2024 04/11/24   Bernardino Ditch, NP  Ivermectin  1 % CREA Apply topically to affected area daily 01/21/23   Bacigalupo, Angela M, MD  ketoconazole  (NIZORAL ) 2 % shampoo Apply topically 3 (three) times a week. 01/21/23   Myrla Jon HERO, MD  losartan  (COZAAR ) 50 MG tablet Take 1 tablet (  50 mg total) by mouth daily. 02/06/24   Bacigalupo, Angela M, MD  Multiple Vitamin (MULTIVITAMIN) capsule Take 1 capsule by mouth daily.     [provider]  pantoprazole  (PROTONIX ) 20 MG tablet Take 1 tablet (20 mg total) by mouth 2 (two) times daily. 04/29/24   Ostwalt, Janna, PA-C  rosuvastatin  (CRESTOR ) 5 MG tablet Take 1 tablet (5 mg total) by mouth daily. Patient not taking: Reported on 04/29/2024 02/06/24   Bacigalupo, Angela M, MD  sertraline  (ZOLOFT ) 50 MG tablet Take 1 tablet (50 mg total) by mouth every morning. 02/06/24    Bacigalupo, Angela M, MD  traZODone  (DESYREL ) 100 MG tablet Take 1 tablet (100 mg total) by mouth at bedtime as needed for sleep. 02/06/24   Bacigalupo, Jon HERO, MD    Family History Family History  Problem Relation Age of Onset   Breast cancer Maternal Aunt 59   Kidney disease Mother    Alzheimer's disease Mother    Varicose Veins Mother    Cancer Father     Social History Social History   Tobacco Use   Smoking status: Never   Smokeless tobacco: Never  Vaping Use   Vaping status: Never Used  Substance Use Topics   Alcohol use: No   Drug use: No     Allergies   Oxycodone-acetaminophen    Review of Systems Review of Systems  Constitutional:  Negative for chills and fever.  Gastrointestinal:  Negative for abdominal pain and nausea.  Genitourinary:  Positive for dysuria. Negative for flank pain and hematuria.     Physical Exam Triage Vital Signs ED Triage Vitals  Encounter Vitals Group     BP      Girls Systolic BP Percentile      Girls Diastolic BP Percentile      Boys Systolic BP Percentile      Boys Diastolic BP Percentile      Pulse      Resp      Temp      Temp src      SpO2      Weight      Height      Head Circumference      Peak Flow      Pain Score      Pain Loc      Pain Education      Exclude from Growth Chart    No data found.  Updated Vital Signs BP 117/77   Pulse 69   Temp 98.1 F (36.7 C)   Resp 18   SpO2 95%   Visual Acuity Right Eye Distance:   Left Eye Distance:   Bilateral Distance:    Right Eye Near:   Left Eye Near:    Bilateral Near:     Physical Exam Constitutional:      General: She is not in acute distress. HENT:     Mouth/Throat:     Mouth: Mucous membranes are moist.  Cardiovascular:     Rate and Rhythm: Normal rate and regular rhythm.  Pulmonary:     Effort: Pulmonary effort is normal. No respiratory distress.  Abdominal:     General: Bowel sounds are normal.     Palpations: Abdomen is soft.      Tenderness: There is no abdominal tenderness. There is no right CVA tenderness, left CVA tenderness, guarding or rebound.  Neurological:     Mental Status: She is alert.      UC Treatments / Results  Labs (all  labs ordered are listed, but only abnormal results are displayed) Labs Reviewed  POCT URINALYSIS DIP (MANUAL ENTRY) - Abnormal; Notable for the following components:      Result Value   Clarity, UA cloudy (*)    Ketones, POC UA trace (5) (*)    Blood, UA small (*)    Protein Ur, POC trace (*)    Leukocytes, UA Small (1+) (*)    All other components within normal limits  URINE CULTURE    EKG   Radiology No results found.  Procedures Procedures (including critical care time)  Medications Ordered in UC Medications - No data to display  Initial Impression / Assessment and Plan / UC Course  I have reviewed the triage vital signs and the nursing notes.  Pertinent labs & imaging results that were available during my care of the patient were reviewed by me and considered in my medical decision making (see chart for details).   Dysuria.  Treating with Keflex . Urine culture pending. Discussed with patient that we will call her if the urine culture shows the need to change or discontinue the antibiotic. Instructed her to follow-up with her PCP if her symptoms are not improving. Patient agrees to plan of care.    Final Clinical Impressions(s) / UC Diagnoses   Final diagnoses:  Dysuria     Discharge Instructions      Take the antibiotic as directed.  The urine culture is pending.  We will call you if it shows the need to change or discontinue your antibiotic.    Follow-up with your primary care provider if your symptoms are not improving.      ED Prescriptions     Medication Sig Dispense Auth. Provider   cephALEXin  (KEFLEX ) 500 MG capsule Take 1 capsule (500 mg total) by mouth 3 (three) times daily for 5 days. 15 capsule Corlis Burnard DEL, NP      PDMP not  reviewed this encounter.   Corlis Burnard DEL, NP 07/05/24 1327

## 2024-07-05 NOTE — ED Triage Notes (Signed)
 Patient to Urgent Care with complaints of  dysuria. Denies any known fevers.   Reports symptoms started yesterday.

## 2024-07-07 ENCOUNTER — Encounter: Payer: Self-pay | Admitting: Family Medicine

## 2024-07-07 ENCOUNTER — Ambulatory Visit (HOSPITAL_COMMUNITY): Payer: Self-pay

## 2024-07-07 LAB — URINE CULTURE: Culture: >=100000 — AB

## 2024-07-08 MED ORDER — SCOPOLAMINE 1 MG/3DAYS TD PT72: 1.0000 | MEDICATED_PATCH | TRANSDERMAL | 0 refills | Status: DC

## 2024-08-06 ENCOUNTER — Encounter: Payer: Medicare Other | Admitting: Family Medicine

## 2024-09-07 ENCOUNTER — Ambulatory Visit (INDEPENDENT_AMBULATORY_CARE_PROVIDER_SITE_OTHER): Admitting: Family Medicine

## 2024-09-07 ENCOUNTER — Encounter: Payer: Self-pay | Admitting: Family Medicine

## 2024-09-07 VITALS — BP 115/75 | HR 77 | Ht 63.0 in | Wt 183.2 lb

## 2024-09-07 DIAGNOSIS — Z23 Encounter for immunization: Secondary | ICD-10-CM | POA: Diagnosis not present

## 2024-09-07 DIAGNOSIS — Z0000 Encounter for general adult medical examination without abnormal findings: Secondary | ICD-10-CM

## 2024-09-07 DIAGNOSIS — I10 Essential (primary) hypertension: Secondary | ICD-10-CM | POA: Diagnosis not present

## 2024-09-07 DIAGNOSIS — R7303 Prediabetes: Secondary | ICD-10-CM

## 2024-09-07 DIAGNOSIS — E781 Pure hyperglyceridemia: Secondary | ICD-10-CM

## 2024-09-07 DIAGNOSIS — F3342 Major depressive disorder, recurrent, in full remission: Secondary | ICD-10-CM

## 2024-09-07 NOTE — Patient Instructions (Signed)
 I strongly recommend two doses of Shingrix (the shingles vaccine) separated by 2 to 6 months for adults age 69 years and older. I recommend checking with your insurance plan regarding coverage for this vaccine.

## 2024-09-07 NOTE — Assessment & Plan Note (Signed)
 Well controlled Continue current medications Recheck metabolic panel F/u in 6 months

## 2024-09-07 NOTE — Progress Notes (Signed)
 Complete physical exam   Patient: [name] [name]   DOB: 1956/08/08   68 y.o. Female  MRN: [id number] Visit Date: 09/07/2024  Today's healthcare provider: Jon Eva, MD   Chief Complaint  Patient presents with   Annual Exam    Last completed 08/05/23 Diet -  General Exercise - not in the last 6 to 7 months Feeling - well Sleeping - well when taking trazodone  Concerns -  none   Subjective    [name] [name] is a 68 y.o. female who presents today for a complete physical exam.   Discussed the use of AI scribe software for clinical note transcription with the patient, who gave verbal consent to proceed.  History of Present Illness   [name] [name] is a 68 year old female who presents for an annual physical exam.  She has been traveling frequently to [place]  to assist with her best friend's care, which has been emotionally and logistically challenging. Her mood remains stable on sertraline  50 mg daily, and she uses trazodone  100 mg at bedtime as needed, especially during travel. She takes diltiazem  as needed for heart racing, hydrochlorothiazide  12.5 mg daily, losartan  50 mg daily, and rosuvastatin  5 mg daily.  She recently had stitches removed from a site where squamous cell cancer was excised. The area remains tender but has improved since the removal of stitches.  In terms of preventive care, she had a mammogram in July and is current with her colonoscopy until 2031. She plans to receive a flu shot during this visit. She is retired, actively involved in her community, and participates in multiple PTOs. She reports no abdominal pain, swelling, or recent vision issues. She visited the dentist in August.        Last depression screening scores    09/07/2024    3:25 PM 04/01/2024    1:11 PM 02/06/2024    1:05 PM  PHQ 2/9 Scores  PHQ - 2 Score 0 0 0  PHQ- 9 Score  0 2   Last fall risk screening    09/07/2024    3:26 PM  Fall Risk   Falls in  the past year? 0  Number falls in past yr: 0  Injury with Fall? 0  Risk for fall due to : No Fall Risks  Follow up Falls evaluation completed        Medications: Outpatient Medications Prior to Visit  Medication Sig   diltiazem  (CARDIZEM ) 30 MG tablet Take 1 tablet (30 mg total) by mouth 3 (three) times daily as needed. For fast heart rate   hydrochlorothiazide  (HYDRODIURIL ) 12.5 MG tablet TAKE ONE TABLET EVERY DAY WITH LOSARTAN  50MG    Ivermectin  1 % CREA Apply topically to affected area daily   ketoconazole  (NIZORAL ) 2 % shampoo Apply topically 3 (three) times a week.   losartan  (COZAAR ) 50 MG tablet Take 1 tablet (50 mg total) by mouth daily.   Multiple Vitamin (MULTIVITAMIN) capsule Take 1 capsule by mouth daily.    rosuvastatin  (CRESTOR ) 5 MG tablet Take 1 tablet (5 mg total) by mouth daily.   sertraline  (ZOLOFT ) 50 MG tablet Take 1 tablet (50 mg total) by mouth every morning.   traZODone  (DESYREL ) 100 MG tablet Take 1 tablet (100 mg total) by mouth at bedtime as needed for sleep.   [DISCONTINUED] fluticasone  (FLONASE ) 50 MCG/ACT nasal spray Place 2 sprays into both nostrils daily. (Patient not taking: Reported on 09/07/2024)   [DISCONTINUED] ipratropium (ATROVENT ) 0.06 % nasal spray  Place 2 sprays into both nostrils 4 (four) times daily. (Patient not taking: Reported on 09/07/2024)   [DISCONTINUED] pantoprazole  (PROTONIX ) 20 MG tablet Take 1 tablet (20 mg total) by mouth 2 (two) times daily. (Patient not taking: Reported on 09/07/2024)   [DISCONTINUED] scopolamine  (TRANSDERM-SCOP) 1 MG/3DAYS Place 1 patch (1.5 mg total) onto the skin every 3 (three) days. (Patient not taking: Reported on 09/07/2024)   No facility-administered medications prior to visit.    Review of Systems    Objective    BP 115/75 (BP Location: Left Arm, Patient Position: Sitting, Cuff Size: Normal)   Pulse 77   Ht 5' 3 (1.6 m)   Wt 183 lb 3.2 oz (83.1 kg)   SpO2 98%   BMI 32.45 kg/m    Physical  Exam Vitals reviewed.  Constitutional:      General: She is not in acute distress.    Appearance: Normal appearance. She is well-developed. She is not diaphoretic.  HENT:     Head: Normocephalic and atraumatic.     Right Ear: Tympanic membrane, ear canal and external ear normal.     Left Ear: Tympanic membrane, ear canal and external ear normal.     Nose: Nose normal.     Mouth/Throat:     Mouth: Mucous membranes are moist.     Pharynx: Oropharynx is clear. No oropharyngeal exudate.  Eyes:     General: No scleral icterus.    Conjunctiva/sclera: Conjunctivae normal.     Pupils: Pupils are equal, round, and reactive to light.  Neck:     Thyroid : No thyromegaly.  Cardiovascular:     Rate and Rhythm: Normal rate and regular rhythm.     Heart sounds: Normal heart sounds. No murmur heard. Pulmonary:     Effort: Pulmonary effort is normal. No respiratory distress.     Breath sounds: Normal breath sounds. No wheezing or rales.  Abdominal:     General: There is no distension.     Palpations: Abdomen is soft.     Tenderness: There is no abdominal tenderness.  Musculoskeletal:        General: No deformity.     Cervical back: Neck supple.     Right lower leg: No edema.     Left lower leg: No edema.  Lymphadenopathy:     Cervical: No cervical adenopathy.  Skin:    General: Skin is warm and dry.     Findings: No rash.  Neurological:     Mental Status: She is alert and oriented to person, place, and time. Mental status is at baseline.     Gait: Gait normal.  Psychiatric:        Mood and Affect: Mood normal.        Behavior: Behavior normal.        Thought Content: Thought content normal.      No results found for any visits on 09/07/24.  Assessment & Plan    Routine Health Maintenance and Physical Exam  Exercise Activities and Dietary recommendations  Goals      04/01/2024: Improve my diet and get down to 160 pounds.     DIET - EAT MORE FRUITS AND VEGETABLES         Immunization History  Administered Date(s) Administered   Fluad Quad(high Dose 65+) 11/08/2022   Fluad Trivalent(High Dose 65+) 10/11/2023   INFLUENZA, HIGH DOSE SEASONAL PF 09/07/2024   Influenza,inj,Quad PF,6+ Mos 11/05/2019   Influenza-Unspecified 09/27/2018, 11/24/2020, 10/11/2021, 10/07/2023   Moderna Covid-19 Fall  Seasonal Vaccine 2yrs & older 12/19/2022   PFIZER Comirnaty(Gray Top)Covid-19 Tri-Sucrose Vaccine 10/20/2020   PFIZER(Purple Top)SARS-COV-2 Vaccination 02/26/2020, 03/18/2020, 04/27/2020, 05/18/2020, 10/20/2020   PNEUMOCOCCAL CONJUGATE-20 01/18/2022   Pfizer Covid-19 Vaccine Bivalent Booster 62yrs & up 11/14/2021   Pneumococcal Conjugate-13 08/24/2020   Td 09/30/2018   Tdap 09/30/2018   Unspecified SARS-COV-2 Vaccination 04/27/2020, 05/18/2020   Zoster, Live 06/18/2011    Health Maintenance  Topic Date Due   Zoster Vaccines- Shingrix (1 of 2) 08/22/2005   COVID-19 Vaccine (11 - 2025-26 season) 08/24/2024   DEXA SCAN  02/23/2025   Medicare Annual Wellness (AWV)  04/01/2025   Mammogram  06/02/2026   DTaP/Tdap/Td (3 - Td or Tdap) 09/30/2028   Colonoscopy  05/06/2030   Pneumococcal Vaccine: 50+ Years  Completed   Influenza Vaccine  Completed   Hepatitis C Screening  Completed   HPV VACCINES  Aged Out   Meningococcal B Vaccine  Aged Out    Discussed health benefits of physical activity, and encouraged her to engage in regular exercise appropriate for her age and condition.  Problem List Items Addressed This Visit       Cardiovascular and Mediastinum   Primary hypertension   Well controlled Continue current medications Recheck metabolic panel F/u in 6 months       Relevant Orders   Comprehensive metabolic panel with GFR     Other   Hypertriglyceridemia   Cholesterol management is ongoing with rosuvastatin  5 mg daily. - Order labs to monitor cholesterol levels      Relevant Orders   Comprehensive metabolic panel with GFR   Lipid Panel With  LDL/HDL Ratio   Prediabetes   Prediabetes is being monitored with regular A1c checks. - Order labs to assess current status      Relevant Orders   Hemoglobin A1c   Recurrent major depressive disorder, in full remission (HCC)   Depression is well-managed on sertraline  50 mg daily. Mood remains stable despite recent stressors.      Other Visit Diagnoses       Encounter for annual physical exam    -  Primary   Relevant Orders   Comprehensive metabolic panel with GFR   Lipid Panel With LDL/HDL Ratio   Hemoglobin A1c     Immunization due       Relevant Orders   Flu vaccine HIGH DOSE PF(Fluzone Trivalent) (Completed)          Adult Wellness Visit Routine adult wellness visit with no new concerns. Blood pressure is well-controlled. Cholesterol management is ongoing. Routine screenings and vaccinations are up to date. Discussed the new shingles vaccine, which is 95-97% effective and not a live vaccine, as a significant improvement over the old one, which was only about 50% effective. - Administer flu shot during the visit - Order labs for cholesterol, kidney and liver function, and A1c - Remind to get shingles vaccine at the pharmacy - Schedule bone density scan next year  Squamous cell carcinoma of skin Recent excision of squamous cell carcinoma on the skin. Healing well post-stitch removal.        Return in about 6 months (around 03/07/2025) for chronic disease f/u.     Jon Eva, MD  [facility] [phone] (phone) [phone] (fax)  [facility]

## 2024-09-07 NOTE — Assessment & Plan Note (Signed)
 Depression is well-managed on sertraline  50 mg daily. Mood remains stable despite recent stressors.

## 2024-09-07 NOTE — Assessment & Plan Note (Signed)
 Cholesterol management is ongoing with rosuvastatin  5 mg daily. - Order labs to monitor cholesterol levels

## 2024-09-07 NOTE — Assessment & Plan Note (Signed)
 Prediabetes is being monitored with regular A1c checks. - Order labs to assess current status

## 2024-12-23 LAB — COMPREHENSIVE METABOLIC PANEL WITH GFR
ALT: 12 IU/L (ref 0–32)
AST: 20 IU/L (ref 0–40)
Albumin: 4.1 g/dL (ref 3.9–4.9)
Alkaline Phosphatase: 86 IU/L (ref 49–135)
BUN/Creatinine Ratio: 13 (ref 12–28)
BUN: 13 mg/dL (ref 8–27)
Bilirubin Total: 0.3 mg/dL (ref 0.0–1.2)
CO2: 25 mmol/L (ref 20–29)
Calcium: 9.7 mg/dL (ref 8.7–10.3)
Chloride: 104 mmol/L (ref 96–106)
Creatinine, Ser: 1.02 mg/dL — ABNORMAL HIGH (ref 0.57–1.00)
Globulin, Total: 2.3 g/dL (ref 1.5–4.5)
Glucose: 103 mg/dL — ABNORMAL HIGH (ref 70–99)
Potassium: 4.2 mmol/L (ref 3.5–5.2)
Sodium: 144 mmol/L (ref 134–144)
Total Protein: 6.4 g/dL (ref 6.0–8.5)
eGFR: 60 mL/min/1.73

## 2024-12-23 LAB — LIPID PANEL WITH LDL/HDL RATIO
Cholesterol, Total: 163 mg/dL (ref 100–199)
HDL: 56 mg/dL
LDL Chol Calc (NIH): 89 mg/dL (ref 0–99)
LDL/HDL Ratio: 1.6 ratio (ref 0.0–3.2)
Triglycerides: 96 mg/dL (ref 0–149)
VLDL Cholesterol Cal: 18 mg/dL (ref 5–40)

## 2024-12-23 LAB — HEMOGLOBIN A1C
Est. average glucose Bld gHb Est-mCnc: 123 mg/dL
Hgb A1c MFr Bld: 5.9 % — ABNORMAL HIGH (ref 4.8–5.6)

## 2024-12-28 ENCOUNTER — Ambulatory Visit: Payer: Self-pay | Admitting: Family Medicine

## 2024-12-29 DIAGNOSIS — F3342 Major depressive disorder, recurrent, in full remission: Secondary | ICD-10-CM

## 2024-12-29 MED ORDER — LOSARTAN POTASSIUM 50 MG PO TABS: 50.0000 mg | ORAL_TABLET | Freq: Every day | ORAL | 0 refills | Status: AC

## 2024-12-29 MED ORDER — SERTRALINE HCL 50 MG PO TABS: 50.0000 mg | ORAL_TABLET | ORAL | 0 refills | Status: DC

## 2024-12-29 MED ORDER — TRAZODONE HCL 100 MG PO TABS: 100.0000 mg | ORAL_TABLET | Freq: Every evening | ORAL | 1 refills | Status: AC | PRN

## 2024-12-29 MED ORDER — ROSUVASTATIN CALCIUM 5 MG PO TABS: 5.0000 mg | ORAL_TABLET | Freq: Every day | ORAL | 0 refills | Status: AC

## 2024-12-29 MED ORDER — HYDROCHLOROTHIAZIDE 12.5 MG PO TABS: ORAL_TABLET | ORAL | 0 refills | Status: AC

## 2024-12-29 NOTE — Telephone Encounter (Signed)
 [facility] faxed refill request for the following medications:  traZODone  (DESYREL ) 100 MG tablet   losartan  (COZAAR ) 50 MG tablet   rosuvastatin  (CRESTOR ) 5 MG tablet   hydrochlorothiazide  (HYDRODIURIL ) 12.5 MG tablet    Please advise.

## 2025-01-07 ENCOUNTER — Encounter: Payer: Self-pay | Admitting: Family Medicine

## 2025-01-08 ENCOUNTER — Other Ambulatory Visit: Payer: Self-pay

## 2025-01-08 DIAGNOSIS — F3342 Major depressive disorder, recurrent, in full remission: Secondary | ICD-10-CM

## 2025-01-08 MED ORDER — SERTRALINE HCL 50 MG PO TABS: 50.0000 mg | ORAL_TABLET | ORAL | 0 refills | Status: AC

## 2025-03-08 ENCOUNTER — Ambulatory Visit: Admitting: Family Medicine

## 2025-03-15 ENCOUNTER — Ambulatory Visit: Admitting: Family Medicine

## 2025-04-07 ENCOUNTER — Ambulatory Visit
